# Patient Record
Sex: Male | Born: 1970 | Race: Black or African American | Hispanic: No | Marital: Married | Smoking: Never smoker
Health system: Southern US, Community
[De-identification: ages and names within clinical notes are randomized; demographics above are authoritative.]

## PROBLEM LIST (undated history)

## (undated) DIAGNOSIS — K219 Gastro-esophageal reflux disease without esophagitis: Secondary | ICD-10-CM

## (undated) DIAGNOSIS — E669 Obesity, unspecified: Secondary | ICD-10-CM

## (undated) DIAGNOSIS — E119 Type 2 diabetes mellitus without complications: Secondary | ICD-10-CM

## (undated) HISTORY — DX: Type 2 diabetes mellitus without complications: E11.9

## (undated) HISTORY — DX: Gastro-esophageal reflux disease without esophagitis: K21.9

## (undated) HISTORY — DX: Obesity, unspecified: E66.9

## (undated) HISTORY — PX: GROWTH PLATE SURGERY: SHX657

## (undated) HISTORY — PX: WISDOM TOOTH EXTRACTION: SHX21

---

## 2012-05-29 ENCOUNTER — Ambulatory Visit (INDEPENDENT_AMBULATORY_CARE_PROVIDER_SITE_OTHER): Payer: 59 | Admitting: Internal Medicine

## 2012-05-29 ENCOUNTER — Encounter: Payer: Self-pay | Admitting: Internal Medicine

## 2012-05-29 VITALS — BP 142/80 | HR 78 | Temp 98.7°F | Resp 16 | Ht 72.0 in | Wt 368.0 lb

## 2012-05-29 DIAGNOSIS — Z0000 Encounter for general adult medical examination without abnormal findings: Secondary | ICD-10-CM

## 2012-05-29 DIAGNOSIS — Z23 Encounter for immunization: Secondary | ICD-10-CM | POA: Insufficient documentation

## 2012-05-29 DIAGNOSIS — Z136 Encounter for screening for cardiovascular disorders: Secondary | ICD-10-CM

## 2012-05-29 MED ORDER — LORCASERIN HCL 10 MG PO TABS: 1.0000 | ORAL_TABLET | Freq: Two times a day (BID) | ORAL | Status: DC

## 2012-05-29 NOTE — Progress Notes (Signed)
  Subjective:    Patient ID: Tyler Benton, male    DOB: 1970-11-30, 42 y.o.   MRN: [id number]  HPI  New to me for a physical - he is concerned about his weight and his blood sugar.  Review of Systems  Constitutional: Negative.  Negative for fever, chills, diaphoresis, activity change, appetite change, fatigue and unexpected weight change.  HENT: Negative.   Eyes: Negative.   Respiratory: Negative.  Negative for apnea, cough, choking, chest tightness, shortness of breath, wheezing and stridor.   Cardiovascular: Negative.  Negative for chest pain, palpitations and leg swelling.  Gastrointestinal: Negative.  Negative for abdominal pain.  Endocrine: Negative for polydipsia, polyphagia and polyuria.  Genitourinary: Negative.  Negative for scrotal swelling and testicular pain.  Musculoskeletal: Negative.   Skin: Negative.   Allergic/Immunologic: Negative.   Neurological: Negative.  Negative for dizziness, tremors, syncope, weakness and light-headedness.  Hematological: Negative.  Negative for adenopathy. Does not bruise/bleed easily.  Psychiatric/Behavioral: Negative.        Objective:   Physical Exam  Vitals reviewed. Constitutional: He is oriented to person, place, and time. He appears well-developed and well-nourished. No distress.  HENT:  Head: Normocephalic and atraumatic.  Mouth/Throat: No oropharyngeal exudate.  Eyes: Conjunctivae are normal. Right eye exhibits no discharge. Left eye exhibits no discharge. No scleral icterus.  Neck: Normal range of motion. Neck supple. No JVD present. No tracheal deviation present. No thyromegaly present.  Cardiovascular: Normal rate, regular rhythm, normal heart sounds and intact distal pulses.  Exam reveals no gallop and no friction rub.   No murmur heard. Pulmonary/Chest: Effort normal and breath sounds normal. No stridor. No respiratory distress. He has no wheezes. He has no rales. He exhibits no tenderness.  Abdominal: Soft. Bowel sounds  are normal. He exhibits no distension and no mass. There is no tenderness. There is no rebound and no guarding. Hernia confirmed negative in the right inguinal area and confirmed negative in the left inguinal area.  Genitourinary: Testes normal and penis normal. Right testis shows no mass, no swelling and no tenderness. Right testis is descended. Left testis shows no mass, no swelling and no tenderness. Left testis is descended. Circumcised. No penile erythema or penile tenderness. No discharge found.  Musculoskeletal: Normal range of motion. He exhibits no edema and no tenderness.  Lymphadenopathy:    He has no cervical adenopathy.       Right: No inguinal adenopathy present.       Left: No inguinal adenopathy present.  Neurological: He is oriented to person, place, and time.  Skin: Skin is warm and dry. No rash noted. He is not diaphoretic. No erythema. No pallor.  Psychiatric: He has a normal mood and affect. His behavior is normal. Judgment and thought content normal.      No results found for this basename: WBC, HGB, HCT, PLT, GLUCOSE, CHOL, TRIG, HDL, LDLDIRECT, LDLCALC, ALT, AST, NA, K, CL, CREATININE, BUN, CO2, TSH, PSA, INR, GLUF, HGBA1C, MICROALBUR      Assessment & Plan:

## 2012-05-29 NOTE — Assessment & Plan Note (Signed)
Exam done Vaccines were reviewed Labs ordered Pt ed material was given 

## 2012-05-29 NOTE — Assessment & Plan Note (Signed)
I will check his labs today to look for complications and secondary causes He will try belviq as well

## 2012-05-29 NOTE — Patient Instructions (Signed)
Health Maintenance, Males A healthy lifestyle and preventative care can promote health and wellness.  Maintain regular health, dental, and eye exams.  Eat a healthy diet. Foods like vegetables, fruits, whole grains, low-fat dairy products, and lean protein foods contain the nutrients you need without too many calories. Decrease your intake of foods high in solid fats, added sugars, and salt. Get information about a proper diet from your caregiver, if necessary.  Regular physical exercise is one of the most important things you can do for your health. Most adults should get at least 150 minutes of moderate-intensity exercise (any activity that increases your heart rate and causes you to sweat) each week. In addition, most adults need muscle-strengthening exercises on 2 or more days a week.   Maintain a healthy weight. The body mass index (BMI) is a screening tool to identify possible weight problems. It provides an estimate of body fat based on height and weight. Your caregiver can help determine your BMI, and can help you achieve or maintain a healthy weight. For adults 20 years and older:  A BMI below 18.5 is considered underweight.  A BMI of 18.5 to 24.9 is normal.  A BMI of 25 to 29.9 is considered overweight.  A BMI of 30 and above is considered obese.  Maintain normal blood lipids and cholesterol by exercising and minimizing your intake of saturated fat. Eat a balanced diet with plenty of fruits and vegetables. Blood tests for lipids and cholesterol should begin at age 20 and be repeated every 5 years. If your lipid or cholesterol levels are high, you are over 50, or you are a high risk for heart disease, you may need your cholesterol levels checked more frequently.Ongoing high lipid and cholesterol levels should be treated with medicines, if diet and exercise are not effective.  If you smoke, find out from your caregiver how to quit. If you do not use tobacco, do not start.  If you  choose to drink alcohol, do not exceed 2 drinks per day. One drink is considered to be 12 ounces (355 mL) of beer, 5 ounces (148 mL) of wine, or 1.5 ounces (44 mL) of liquor.  Avoid use of street drugs. Do not share needles with anyone. Ask for help if you need support or instructions about stopping the use of drugs.  High blood pressure causes heart disease and increases the risk of stroke. Blood pressure should be checked at least every 1 to 2 years. Ongoing high blood pressure should be treated with medicines if weight loss and exercise are not effective.  If you are 45 to 40 years old, ask your caregiver if you should take aspirin to prevent heart disease.  Diabetes screening involves taking a blood sample to check your fasting blood sugar level. This should be done once every 3 years, after age 45, if you are within normal weight and without risk factors for diabetes. Testing should be considered at a younger age or be carried out more frequently if you are overweight and have at least 1 risk factor for diabetes.  Colorectal cancer can be detected and often prevented. Most routine colorectal cancer screening begins at the age of 50 and continues through age 75. However, your caregiver may recommend screening at an earlier age if you have risk factors for colon cancer. On a yearly basis, your caregiver may provide home test kits to check for hidden blood in the stool. Use of a small camera at the end of a tube,   to directly examine the colon (sigmoidoscopy or colonoscopy), can detect the earliest forms of colorectal cancer. Talk to your caregiver about this at age 50, when routine screening begins. Direct examination of the colon should be repeated every 5 to 10 years through age 75, unless early forms of pre-cancerous polyps or small growths are found.  Hepatitis C blood testing is recommended for all people born from 1945 through 1965 and any individual with known risks for hepatitis C.  Healthy  men should no longer receive prostate-specific antigen (PSA) blood tests as part of routine cancer screening. Consult with your caregiver about prostate cancer screening.  Testicular cancer screening is not recommended for adolescents or adult males who have no symptoms. Screening includes self-exam, caregiver exam, and other screening tests. Consult with your caregiver about any symptoms you have or any concerns you have about testicular cancer.  Practice safe sex. Use condoms and avoid high-risk sexual practices to reduce the spread of sexually transmitted infections (STIs).  Use sunscreen with a sun protection factor (SPF) of 30 or greater. Apply sunscreen liberally and repeatedly throughout the day. You should seek shade when your shadow is shorter than you. Protect yourself by wearing long sleeves, pants, a wide-brimmed hat, and sunglasses year round, whenever you are outdoors.  Notify your caregiver of new moles or changes in moles, especially if there is a change in shape or color. Also notify your caregiver if a mole is larger than the size of a pencil eraser.  A one-time screening for abdominal aortic aneurysm (AAA) and surgical repair of large AAAs by sound wave imaging (ultrasonography) is recommended for ages 65 to 75 years who are current or former smokers.  Stay current with your immunizations. Document Released: 07/20/2007 Document Revised: 04/15/2011 Document Reviewed: 06/18/2010 [employer] Patient Information 2013 [employer].  

## 2012-05-29 NOTE — Assessment & Plan Note (Signed)
His EKG is normal and he has no symptoms of heart disease

## 2012-06-01 ENCOUNTER — Encounter: Payer: Self-pay | Admitting: Internal Medicine

## 2012-06-01 ENCOUNTER — Other Ambulatory Visit (INDEPENDENT_AMBULATORY_CARE_PROVIDER_SITE_OTHER): Payer: 59

## 2012-06-01 DIAGNOSIS — Z0000 Encounter for general adult medical examination without abnormal findings: Secondary | ICD-10-CM

## 2012-06-01 LAB — COMPREHENSIVE METABOLIC PANEL
ALT: 33 U/L (ref 0–53)
AST: 26 U/L (ref 0–37)
Albumin: 4 g/dL (ref 3.5–5.2)
Alkaline Phosphatase: 51 U/L (ref 39–117)
BUN: 13 mg/dL (ref 6–23)
CO2: 31 mEq/L (ref 19–32)
Calcium: 9.3 mg/dL (ref 8.4–10.5)
Chloride: 103 mEq/L (ref 96–112)
Creatinine, Ser: 0.9 mg/dL (ref 0.4–1.5)
GFR: 114.33 mL/min (ref >60.00)
Glucose, Bld: 158 mg/dL — ABNORMAL HIGH (ref 70–99)
Potassium: 4.3 mEq/L (ref 3.5–5.1)
Sodium: 140 mEq/L (ref 135–145)
Total Bilirubin: 1.3 mg/dL — ABNORMAL HIGH (ref 0.3–1.2)
Total Protein: 7.6 g/dL (ref 6.0–8.3)

## 2012-06-01 LAB — CBC WITH DIFFERENTIAL/PLATELET
Basophils Absolute: 0.1 10*3/uL (ref 0.0–0.1)
Basophils Relative: 0.7 % (ref 0.0–3.0)
Eosinophils Absolute: 0.1 10*3/uL (ref 0.0–0.7)
Eosinophils Relative: 1.9 % (ref 0.0–5.0)
HCT: 44.6 % (ref 39.0–52.0)
Hemoglobin: 15 g/dL (ref 13.0–17.0)
Lymphocytes Relative: 25.7 % (ref 12.0–46.0)
Lymphs Abs: 1.9 10*3/uL (ref 0.7–4.0)
MCHC: 33.6 g/dL (ref 30.0–36.0)
MCV: 96.8 fl (ref 78.0–100.0)
Monocytes Absolute: 0.8 10*3/uL (ref 0.1–1.0)
Monocytes Relative: 11.1 % (ref 3.0–12.0)
Neutro Abs: 4.5 10*3/uL (ref 1.4–7.7)
Neutrophils Relative %: 60.6 % (ref 43.0–77.0)
Platelets: 289 10*3/uL (ref 150.0–400.0)
RBC: 4.61 Mil/uL (ref 4.22–5.81)
RDW: 13.2 % (ref 11.5–14.6)
WBC: 7.4 10*3/uL (ref 4.5–10.5)

## 2012-06-01 LAB — URINALYSIS, ROUTINE W REFLEX MICROSCOPIC
Bilirubin Urine: NEGATIVE
Hgb urine dipstick: NEGATIVE
Ketones, ur: NEGATIVE
Leukocytes, UA: NEGATIVE
Nitrite: NEGATIVE
Specific Gravity, Urine: >=1.03 (ref 1.000–1.030)
Total Protein, Urine: NEGATIVE
Urine Glucose: NEGATIVE
Urobilinogen, UA: 0.2 (ref 0.0–1.0)
pH: 6 (ref 5.0–8.0)

## 2012-06-01 LAB — TSH: TSH: 1.04 u[IU]/mL (ref 0.35–5.50)

## 2012-06-01 LAB — HEMOGLOBIN A1C: Hgb A1c MFr Bld: 6.6 % — ABNORMAL HIGH (ref 4.6–6.5)

## 2012-06-01 LAB — LIPID PANEL
Cholesterol: 178 mg/dL (ref 0–200)
HDL: 40.1 mg/dL (ref >39.00)
LDL Cholesterol: 124 mg/dL — ABNORMAL HIGH (ref 0–99)
Total CHOL/HDL Ratio: 4
Triglycerides: 70 mg/dL (ref 0.0–149.0)
VLDL: 14 mg/dL (ref 0.0–40.0)

## 2013-09-24 ENCOUNTER — Other Ambulatory Visit (INDEPENDENT_AMBULATORY_CARE_PROVIDER_SITE_OTHER): Payer: 59

## 2013-09-24 ENCOUNTER — Ambulatory Visit (INDEPENDENT_AMBULATORY_CARE_PROVIDER_SITE_OTHER): Payer: 59 | Admitting: Internal Medicine

## 2013-09-24 ENCOUNTER — Encounter: Payer: Self-pay | Admitting: Internal Medicine

## 2013-09-24 VITALS — BP 120/88 | HR 88 | Temp 98.5°F | Resp 16 | Ht 72.0 in | Wt 351.0 lb

## 2013-09-24 DIAGNOSIS — IMO0001 Reserved for inherently not codable concepts without codable children: Secondary | ICD-10-CM

## 2013-09-24 DIAGNOSIS — E1165 Type 2 diabetes mellitus with hyperglycemia: Secondary | ICD-10-CM

## 2013-09-24 DIAGNOSIS — R0683 Snoring: Secondary | ICD-10-CM | POA: Insufficient documentation

## 2013-09-24 DIAGNOSIS — M722 Plantar fascial fibromatosis: Secondary | ICD-10-CM

## 2013-09-24 DIAGNOSIS — Z0000 Encounter for general adult medical examination without abnormal findings: Secondary | ICD-10-CM

## 2013-09-24 DIAGNOSIS — G4733 Obstructive sleep apnea (adult) (pediatric): Secondary | ICD-10-CM

## 2013-09-24 DIAGNOSIS — E118 Type 2 diabetes mellitus with unspecified complications: Secondary | ICD-10-CM | POA: Insufficient documentation

## 2013-09-24 LAB — COMPREHENSIVE METABOLIC PANEL
ALT: 44 U/L (ref 0–53)
AST: 32 U/L (ref 0–37)
Albumin: 4.2 g/dL (ref 3.5–5.2)
Alkaline Phosphatase: 61 U/L (ref 39–117)
BUN: 11 mg/dL (ref 6–23)
CO2: 32 mEq/L (ref 19–32)
Calcium: 10 mg/dL (ref 8.4–10.5)
Chloride: 95 mEq/L — ABNORMAL LOW (ref 96–112)
Creatinine, Ser: 1 mg/dL (ref 0.4–1.5)
GFR: 112.2 mL/min (ref >60.00)
Glucose, Bld: 266 mg/dL — ABNORMAL HIGH (ref 70–99)
Potassium: 3.9 mEq/L (ref 3.5–5.1)
Sodium: 135 mEq/L (ref 135–145)
Total Bilirubin: 1.6 mg/dL — ABNORMAL HIGH (ref 0.2–1.2)
Total Protein: 7.9 g/dL (ref 6.0–8.3)

## 2013-09-24 LAB — CBC WITH DIFFERENTIAL/PLATELET
Basophils Absolute: 0.1 10*3/uL (ref 0.0–0.1)
Basophils Relative: 1 % (ref 0.0–3.0)
Eosinophils Absolute: 0.1 10*3/uL (ref 0.0–0.7)
Eosinophils Relative: 1.2 % (ref 0.0–5.0)
HCT: 48.6 % (ref 39.0–52.0)
Hemoglobin: 16.3 g/dL (ref 13.0–17.0)
Lymphocytes Relative: 25.2 % (ref 12.0–46.0)
Lymphs Abs: 1.7 10*3/uL (ref 0.7–4.0)
MCHC: 33.5 g/dL (ref 30.0–36.0)
MCV: 96.7 fl (ref 78.0–100.0)
Monocytes Absolute: 0.7 10*3/uL (ref 0.1–1.0)
Monocytes Relative: 10.4 % (ref 3.0–12.0)
Neutro Abs: 4.2 10*3/uL (ref 1.4–7.7)
Neutrophils Relative %: 62.2 % (ref 43.0–77.0)
Platelets: 285 10*3/uL (ref 150.0–400.0)
RBC: 5.03 Mil/uL (ref 4.22–5.81)
RDW: 13.1 % (ref 11.5–15.5)
WBC: 6.7 10*3/uL (ref 4.0–10.5)

## 2013-09-24 LAB — TSH: TSH: 1.2 u[IU]/mL (ref 0.35–4.50)

## 2013-09-24 LAB — LIPID PANEL
Cholesterol: 210 mg/dL — ABNORMAL HIGH (ref 0–200)
HDL: 41.7 mg/dL (ref >39.00)
LDL Cholesterol: 148 mg/dL — ABNORMAL HIGH (ref 0–99)
NonHDL: 168.3
Total CHOL/HDL Ratio: 5
Triglycerides: 102 mg/dL (ref 0.0–149.0)
VLDL: 20.4 mg/dL (ref 0.0–40.0)

## 2013-09-24 LAB — HEMOGLOBIN A1C: Hgb A1c MFr Bld: 9.2 % — ABNORMAL HIGH (ref 4.6–6.5)

## 2013-09-24 MED ORDER — LIRAGLUTIDE 18 MG/3ML ~~LOC~~ SOPN: 1.0000 | PEN_INJECTOR | Freq: Every day | SUBCUTANEOUS | Status: DC

## 2013-09-24 MED ORDER — DAPAGLIFLOZIN PRO-METFORMIN ER 5-1000 MG PO TB24: 2.0000 | ORAL_TABLET | Freq: Every day | ORAL | Status: DC

## 2013-09-24 MED ORDER — LORCASERIN HCL 10 MG PO TABS: 1.0000 | ORAL_TABLET | Freq: Two times a day (BID) | ORAL | Status: DC

## 2013-09-24 NOTE — Assessment & Plan Note (Signed)
Will refer to sleep med for evaluation and treatment

## 2013-09-24 NOTE — Patient Instructions (Signed)
Health Maintenance A healthy lifestyle and preventative care can promote health and wellness.  Maintain regular health, dental, and eye exams.  Eat a healthy diet. Foods like vegetables, fruits, whole grains, low-fat dairy products, and lean protein foods contain the nutrients you need and are low in calories. Decrease your intake of foods high in solid fats, added sugars, and salt. Get information about a proper diet from your health care provider, if necessary.  Regular physical exercise is one of the most important things you can do for your health. Most adults should get at least 150 minutes of moderate-intensity exercise (any activity that increases your heart rate and causes you to sweat) each week. In addition, most adults need muscle-strengthening exercises on 2 or more days a week.   Maintain a healthy weight. The body mass index (BMI) is a screening tool to identify possible weight problems. It provides an estimate of body fat based on height and weight. Your health care provider can find your BMI and can help you achieve or maintain a healthy weight. For males 20 years and older:  A BMI below 18.5 is considered underweight.  A BMI of 18.5 to 24.9 is normal.  A BMI of 25 to 29.9 is considered overweight.  A BMI of 30 and above is considered obese.  Maintain normal blood lipids and cholesterol by exercising and minimizing your intake of saturated fat. Eat a balanced diet with plenty of fruits and vegetables. Blood tests for lipids and cholesterol should begin at age 20 and be repeated every 5 years. If your lipid or cholesterol levels are high, you are over age 50, or you are at high risk for heart disease, you may need your cholesterol levels checked more frequently.Ongoing high lipid and cholesterol levels should be treated with medicines if diet and exercise are not working.  If you smoke, find out from your health care provider how to quit. If you do not use tobacco, do not  start.  Lung cancer screening is recommended for adults aged 55-80 years who are at high risk for developing lung cancer because of a history of smoking. A yearly low-dose CT scan of the lungs is recommended for people who have at least a 30-pack-year history of smoking and are current smokers or have quit within the past 15 years. A pack year of smoking is smoking an average of 1 pack of cigarettes a day for 1 year (for example, a 30-pack-year history of smoking could mean smoking 1 pack a day for 30 years or 2 packs a day for 15 years). Yearly screening should continue until the smoker has stopped smoking for at least 15 years. Yearly screening should be stopped for people who develop a health problem that would prevent them from having lung cancer treatment.  If you choose to drink alcohol, do not have more than 2 drinks per day. One drink is considered to be 12 oz (360 mL) of beer, 5 oz (150 mL) of wine, or 1.5 oz (45 mL) of liquor.  Avoid the use of street drugs. Do not share needles with anyone. Ask for help if you need support or instructions about stopping the use of drugs.  High blood pressure causes heart disease and increases the risk of stroke. Blood pressure should be checked at least every 1-2 years. Ongoing high blood pressure should be treated with medicines if weight loss and exercise are not effective.  If you are 45-79 years old, ask your health care provider if   you should take aspirin to prevent heart disease.  Diabetes screening involves taking a blood sample to check your fasting blood sugar level. This should be done once every 3 years after age 45 if you are at a normal weight and without risk factors for diabetes. Testing should be considered at a younger age or be carried out more frequently if you are overweight and have at least 1 risk factor for diabetes.  Colorectal cancer can be detected and often prevented. Most routine colorectal cancer screening begins at the age of 50  and continues through age 75. However, your health care provider may recommend screening at an earlier age if you have risk factors for colon cancer. On a yearly basis, your health care provider may provide home test kits to check for hidden blood in the stool. A small camera at the end of a tube may be used to directly examine the colon (sigmoidoscopy or colonoscopy) to detect the earliest forms of colorectal cancer. Talk to your health care provider about this at age 50 when routine screening begins. A direct exam of the colon should be repeated every 5-10 years through age 75, unless early forms of precancerous polyps or small growths are found.  People who are at an increased risk for hepatitis B should be screened for this virus. You are considered at high risk for hepatitis B if:  You were born in a country where hepatitis B occurs often. Talk with your health care provider about which countries are considered high risk.  Your parents were born in a high-risk country and you have not received a shot to protect against hepatitis B (hepatitis B vaccine).  You have HIV or AIDS.  You use needles to inject street drugs.  You live with, or have sex with, someone who has hepatitis B.  You are a man who has sex with other men (MSM).  You get hemodialysis treatment.  You take certain medicines for conditions like cancer, organ transplantation, and autoimmune conditions.  Hepatitis C blood testing is recommended for all people born from 1945 through 1965 and any individual with known risk factors for hepatitis C.  Healthy men should no longer receive prostate-specific antigen (PSA) blood tests as part of routine cancer screening. Talk to your health care provider about prostate cancer screening.  Testicular cancer screening is not recommended for adolescents or adult males who have no symptoms. Screening includes self-exam, a health care provider exam, and other screening tests. Consult with your  health care provider about any symptoms you have or any concerns you have about testicular cancer.  Practice safe sex. Use condoms and avoid high-risk sexual practices to reduce the spread of sexually transmitted infections (STIs).  You should be screened for STIs, including gonorrhea and chlamydia if:  You are sexually active and are younger than 24 years.  You are older than 24 years, and your health care provider tells you that you are at risk for this type of infection.  Your sexual activity has changed since you were last screened, and you are at an increased risk for chlamydia or gonorrhea. Ask your health care provider if you are at risk.  If you are at risk of being infected with HIV, it is recommended that you take a prescription medicine daily to prevent HIV infection. This is called pre-exposure prophylaxis (PrEP). You are considered at risk if:  You are a man who has sex with other men (MSM).  You are a heterosexual man who   is sexually active with multiple partners.  You take drugs by injection.  You are sexually active with a partner who has HIV.  Talk with your health care provider about whether you are at high risk of being infected with HIV. If you choose to begin PrEP, you should first be tested for HIV. You should then be tested every 3 months for as long as you are taking PrEP.  Use sunscreen. Apply sunscreen liberally and repeatedly throughout the day. You should seek shade when your shadow is shorter than you. Protect yourself by wearing long sleeves, pants, a wide-brimmed hat, and sunglasses year round whenever you are outdoors.  Tell your health care provider of new moles or changes in moles, especially if there is a change in shape or color. Also, tell your health care provider if a mole is larger than the size of a pencil eraser.  A one-time screening for abdominal aortic aneurysm (AAA) and surgical repair of large AAAs by ultrasound is recommended for men aged  65-75 years who are current or former smokers.  Stay current with your vaccines (immunizations). Document Released: 07/20/2007 Document Revised: 01/26/2013 Document Reviewed: 06/18/2010 Exit[employer] Patient Information 2015 [employer]. This information is not intended to replace advice given to you by your health care provider. Make sure you discuss any questions you have with your health care provider. Type 2 Diabetes Mellitus Type 2 diabetes mellitus, often simply referred to as type 2 diabetes, is a long-lasting (chronic) disease. In type 2 diabetes, the pancreas does not make enough insulin (a hormone), the cells are less responsive to the insulin that is made (insulin resistance), or both. Normally, insulin moves sugars from food into the tissue cells. The tissue cells use the sugars for energy. The lack of insulin or the lack of normal response to insulin causes excess sugars to build up in the blood instead of going into the tissue cells. As a result, high blood sugar (hyperglycemia) develops. The effect of high sugar (glucose) levels can cause many complications. Type 2 diabetes was also previously called adult-onset diabetes, but it can occur at any age.  RISK FACTORS  A person is predisposed to developing type 2 diabetes if someone in the family has the disease and also has one or more of the following primary risk factors:  Overweight.  An inactive lifestyle.  A history of consistently eating high-calorie foods. Maintaining a normal weight and regular physical activity can reduce the chance of developing type 2 diabetes. SYMPTOMS  A person with type 2 diabetes may not show symptoms initially. The symptoms of type 2 diabetes appear slowly. The symptoms include:  Increased thirst (polydipsia).  Increased urination (polyuria).  Increased urination during the night (nocturia).  Weight loss. This weight loss may be rapid.  Frequent, recurring infections.  Tiredness  (fatigue).  Weakness.  Vision changes, such as blurred vision.  Fruity smell to your breath.  Abdominal pain.  Nausea or vomiting.  Cuts or bruises which are slow to heal.  Tingling or numbness in the hands or feet. DIAGNOSIS Type 2 diabetes is frequently not diagnosed until complications of diabetes are present. Type 2 diabetes is diagnosed when symptoms or complications are present and when blood glucose levels are increased. Your blood glucose level may be checked by one or more of the following blood tests:  A fasting blood glucose test. You will not be allowed to eat for at least 8 hours before a blood sample is taken.  A random blood   glucose test. Your blood glucose is checked at any time of the day regardless of when you ate.  A hemoglobin A1c blood glucose test. A hemoglobin A1c test provides information about blood glucose control over the previous 3 months.  An oral glucose tolerance test (OGTT). Your blood glucose is measured after you have not eaten (fasted) for 2 hours and then after you drink a glucose-containing beverage. TREATMENT   You may need to take insulin or diabetes medicine daily to keep blood glucose levels in the desired range.  If you use insulin, you may need to adjust the dosage depending on the carbohydrates that you eat with each meal or snack. The treatment goal is to maintain the before meal blood sugar (preprandial glucose) level at 70-130 mg/dL. HOME CARE INSTRUCTIONS   Have your hemoglobin A1c level checked twice a year.  Perform daily blood glucose monitoring as directed by your health care provider.  Monitor urine ketones when you are ill and as directed by your health care provider.  Take your diabetes medicine or insulin as directed by your health care provider to maintain your blood glucose levels in the desired range.  Never run out of diabetes medicine or insulin. It is needed every day.  If you are using insulin, you may need to  adjust the amount of insulin given based on your intake of carbohydrates. Carbohydrates can raise blood glucose levels but need to be included in your diet. Carbohydrates provide vitamins, minerals, and fiber which are an essential part of a healthy diet. Carbohydrates are found in fruits, vegetables, whole grains, dairy products, legumes, and foods containing added sugars.  Eat healthy foods. You should make an appointment to see a registered dietitian to help you create an eating plan that is right for you.  Lose weight if you are overweight.  Carry a medical alert card or wear your medical alert jewelry.  Carry a 15-gram carbohydrate snack with you at all times to treat low blood glucose (hypoglycemia). Some examples of 15-gram carbohydrate snacks include:  Glucose tablets, 3 or 4.  Glucose gel, 15-gram tube.  Raisins, 2 tablespoons (24 grams).  Jelly beans, 6.  Animal crackers, 8.  Regular pop, 4 ounces (120 mL).  Gummy treats, 9.  Recognize hypoglycemia. Hypoglycemia occurs with blood glucose levels of 70 mg/dL and below. The risk for hypoglycemia increases when fasting or skipping meals, during or after intense exercise, and during sleep. Hypoglycemia symptoms can include:  Tremors or shakes.  Decreased ability to concentrate.  Sweating.  Increased heart rate.  Headache.  Dry mouth.  Hunger.  Irritability.  Anxiety.  Restless sleep.  Altered speech or coordination.  Confusion.  Treat hypoglycemia promptly. If you are alert and able to safely swallow, follow the 15:15 rule:  Take 15-20 grams of rapid-acting glucose or carbohydrate. Rapid-acting options include glucose gel, glucose tablets, or 4 ounces (120 mL) of fruit juice, regular soda, or low-fat milk.  Check your blood glucose level 15 minutes after taking the glucose.  Take 15-20 grams more of glucose if the repeat blood glucose level is still 70 mg/dL or below.  Eat a meal or snack within 1 hour  once blood glucose levels return to normal.  Be alert to feeling very thirsty and urinating more frequently than usual, which are early signs of hyperglycemia. An early awareness of hyperglycemia allows for prompt treatment. Treat hyperglycemia as directed by your health care provider.  Engage in at least 150 minutes of moderate-intensity physical activity   a week, spread over at least 3 days of the week or as directed by your health care provider. In addition, you should engage in resistance exercise at least 2 times a week or as directed by your health care provider. Try to spend no more than 90 minutes at one time inactive.  Adjust your medicine and food intake as needed if you start a new exercise or sport.  Follow your sick-day plan anytime you are unable to eat or drink as usual.  Do not use any tobacco products including cigarettes, chewing tobacco, or electronic cigarettes. If you need help quitting, ask your health care provider.  Limit alcohol intake to no more than 1 drink per day for nonpregnant women and 2 drinks per day for men. You should drink alcohol only when you are also eating food. Talk with your health care provider whether alcohol is safe for you. Tell your health care provider if you drink alcohol several times a week.  Keep all follow-up visits as directed by your health care provider. This is important.  Schedule an eye exam soon after the diagnosis of type 2 diabetes and then annually.  Perform daily skin and foot care. Examine your skin and feet daily for cuts, bruises, redness, nail problems, bleeding, blisters, or sores. A foot exam by a health care provider should be done annually.  Brush your teeth and gums at least twice a day and floss at least once a day. Follow up with your dentist regularly.  Share your diabetes management plan with your workplace or school.  Stay up-to-date with immunizations. It is recommended that people with diabetes who are over 65  years old get the pneumonia vaccine. In some cases, two separate shots may be given. Ask your health care provider if your pneumonia vaccination is up-to-date.  Learn to manage stress.  Obtain ongoing diabetes education and support as needed.  Participate in or seek rehabilitation as needed to maintain or improve independence and quality of life. Request a physical or occupational therapy referral if you are having foot or hand numbness, or difficulties with grooming, dressing, eating, or physical activity. SEEK MEDICAL CARE IF:   You are unable to eat food or drink fluids for more than 6 hours.  You have nausea and vomiting for more than 6 hours.  Your blood glucose level is over 240 mg/dL.  There is a change in mental status.  You develop an additional serious illness.  You have diarrhea for more than 6 hours.  You have been sick or have had a fever for a couple of days and are not getting better.  You have pain during any physical activity.  SEEK IMMEDIATE MEDICAL CARE IF:  You have difficulty breathing.  You have moderate to large ketone levels. MAKE SURE YOU:  Understand these instructions.  Will watch your condition.  Will get help right away if you are not doing well or get worse. Document Released: 01/21/2005 Document Revised: 06/07/2013 Document Reviewed: 08/20/2011 ExitCare Patient Information 2015 [employer]. This information is not intended to replace advice given to you by your health care provider. Make sure you discuss any questions you have with your health care provider.  

## 2013-09-24 NOTE — Assessment & Plan Note (Signed)
He will restart Belviq I have asked him to see nutrition as well

## 2013-09-24 NOTE — Assessment & Plan Note (Signed)
Will start victoza and xigduo to lower his blood sugars He is due for an eye exam He will see diabetic education

## 2013-09-24 NOTE — Assessment & Plan Note (Signed)
Exam done Vaccines were reviewed and updated Labs ordered Pt ed material was given 

## 2013-09-24 NOTE — Progress Notes (Signed)
Subjective:    Patient ID: Tyler Benton, male    DOB: Sep 26, 1970, 43 y.o.   MRN: [id number]  Diabetes He presents for his follow-up diabetic visit. He has type 2 diabetes mellitus. The initial diagnosis of diabetes was made 2 years ago. His disease course has been worsening. There are no hypoglycemic associated symptoms. Associated symptoms include blurred vision, polydipsia, polyphagia and polyuria. Pertinent negatives for diabetes include no chest pain, no fatigue, no foot paresthesias, no foot ulcerations, no visual change, no weakness and no weight loss. There are no hypoglycemic complications. Symptoms are worsening. There are no diabetic complications. Risk factors for coronary artery disease include obesity. Current diabetic treatment includes intensive insulin program and insulin injections. His weight is decreasing steadily. He is following a generally healthy diet. Meal planning includes avoidance of concentrated sweets. He has not had a previous visit with a dietician. He never participates in exercise. His home blood glucose trend is increasing steadily. His breakfast blood glucose range is generally >200 mg/dl. His lunch blood glucose range is generally >200 mg/dl. His dinner blood glucose range is generally >200 mg/dl. His highest blood glucose is >200 mg/dl. His overall blood glucose range is >200 mg/dl. An ACE inhibitor/angiotensin II receptor blocker is not being taken. He does not see a podiatrist.Eye exam is not current.      Review of Systems  Constitutional: Negative.  Negative for fever, chills, weight loss, diaphoresis, activity change, appetite change, fatigue and unexpected weight change.  HENT: Negative.   Eyes: Positive for blurred vision.  Respiratory: Positive for apnea. Negative for cough, choking, chest tightness, shortness of breath, wheezing and stridor.        His daughter tells him that he snores heavily and that he stops breathing during the night.    Cardiovascular: Negative.  Negative for chest pain and leg swelling.  Gastrointestinal: Negative.  Negative for nausea, vomiting, abdominal pain, diarrhea, constipation and blood in stool.  Endocrine: Positive for polydipsia, polyphagia and polyuria.  Genitourinary: Negative.   Musculoskeletal: Positive for arthralgias (bilateral foot pain). Negative for back pain, gait problem, joint swelling, myalgias, neck pain and neck stiffness.  Skin: Negative.  Negative for rash.  Allergic/Immunologic: Negative.   Neurological: Negative.  Negative for weakness.  Hematological: Negative.  Negative for adenopathy. Does not bruise/bleed easily.  Psychiatric/Behavioral: Negative.        Objective:   Physical Exam  Vitals reviewed. Constitutional: He is oriented to person, place, and time. He appears well-developed and well-nourished. No distress.  HENT:  Head: Normocephalic and atraumatic.  Mouth/Throat: Oropharynx is clear and moist. No oropharyngeal exudate.  Eyes: Conjunctivae are normal. Right eye exhibits no discharge. Left eye exhibits no discharge. No scleral icterus.  Neck: Normal range of motion. Neck supple. No JVD present. No tracheal deviation present. No thyromegaly present.  Cardiovascular: Normal rate, normal heart sounds and intact distal pulses.  Exam reveals no gallop and no friction rub.   No murmur heard. Pulmonary/Chest: Effort normal and breath sounds normal. No stridor. No respiratory distress. He has no wheezes. He has no rales. He exhibits no tenderness.  Abdominal: Soft. Bowel sounds are normal. He exhibits no distension and no mass. There is no tenderness. There is no rebound and no guarding. Hernia confirmed negative in the right inguinal area and confirmed negative in the left inguinal area.  Genitourinary: Testes normal and penis normal. Right testis shows no mass, no swelling and no tenderness. Right testis is descended. Left testis shows no mass,  no swelling and no  tenderness. Left testis is descended. Circumcised. No penile erythema or penile tenderness. No discharge found.  Musculoskeletal: Normal range of motion. He exhibits no edema and no tenderness.       Right foot: He exhibits deformity (severely flatfooted). He exhibits normal range of motion, no tenderness, no bony tenderness, no swelling, normal capillary refill, no crepitus and no laceration.       Left foot: He exhibits deformity (severely flatfooted). He exhibits normal range of motion, no tenderness, no bony tenderness, no swelling, normal capillary refill, no crepitus and no laceration.  Lymphadenopathy:    He has no cervical adenopathy.       Right: No inguinal adenopathy present.       Left: No inguinal adenopathy present.  Neurological: He is oriented to person, place, and time.  Skin: Skin is warm and dry. No rash noted. He is not diaphoretic. No erythema. No pallor.  Psychiatric: He has a normal mood and affect. His behavior is normal. Judgment and thought content normal.     Lab Results  Component Value Date   WBC 7.4 06/01/2012   HGB 15.0 06/01/2012   HCT 44.6 06/01/2012   PLT 289.0 06/01/2012   GLUCOSE 158* 06/01/2012   CHOL 178 06/01/2012   TRIG 70.0 06/01/2012   HDL 40.10 06/01/2012   LDLCALC 124* 06/01/2012   ALT 33 06/01/2012   AST 26 06/01/2012   NA 140 06/01/2012   K 4.3 06/01/2012   CL 103 06/01/2012   CREATININE 0.9 06/01/2012   BUN 13 06/01/2012   CO2 31 06/01/2012   TSH 1.04 06/01/2012   HGBA1C 6.6* 06/01/2012       Assessment & Plan:

## 2013-09-24 NOTE — Assessment & Plan Note (Signed)
I think he needs orthotics - sports med referral

## 2013-09-24 NOTE — Progress Notes (Signed)
Pre visit review using our clinic review tool, if applicable. No additional management support is needed unless otherwise documented below in the visit note. 

## 2013-09-25 ENCOUNTER — Encounter: Payer: Self-pay | Admitting: Internal Medicine

## 2013-09-27 ENCOUNTER — Telehealth: Payer: Self-pay | Admitting: Internal Medicine

## 2013-09-27 DIAGNOSIS — E1165 Type 2 diabetes mellitus with hyperglycemia: Principal | ICD-10-CM

## 2013-09-27 DIAGNOSIS — IMO0001 Reserved for inherently not codable concepts without codable children: Secondary | ICD-10-CM

## 2013-09-27 MED ORDER — DAPAGLIFLOZIN PRO-METFORMIN ER 5-1000 MG PO TB24: 2.0000 | ORAL_TABLET | Freq: Every day | ORAL | Status: DC

## 2013-09-27 MED ORDER — LORCASERIN HCL 10 MG PO TABS: 1.0000 | ORAL_TABLET | Freq: Two times a day (BID) | ORAL | Status: DC

## 2013-09-27 MED ORDER — LIRAGLUTIDE 18 MG/3ML ~~LOC~~ SOPN: 1.0000 | PEN_INJECTOR | Freq: Every day | SUBCUTANEOUS | Status: DC

## 2013-09-27 NOTE — Telephone Encounter (Signed)
Done

## 2013-09-27 NOTE — Telephone Encounter (Signed)
Pt called request for our office to send 3 meds that was just sent into [facility] in [place] need to be call into [facility] pt pharmacy. Please advise.

## 2013-09-28 ENCOUNTER — Telehealth: Payer: Self-pay

## 2013-09-28 ENCOUNTER — Other Ambulatory Visit: Payer: Self-pay | Admitting: Internal Medicine

## 2013-09-28 DIAGNOSIS — E1165 Type 2 diabetes mellitus with hyperglycemia: Principal | ICD-10-CM

## 2013-09-28 DIAGNOSIS — IMO0001 Reserved for inherently not codable concepts without codable children: Secondary | ICD-10-CM

## 2013-09-28 MED ORDER — LIRAGLUTIDE 18 MG/3ML ~~LOC~~ SOPN: 1.0000 | PEN_INJECTOR | Freq: Every day | SUBCUTANEOUS | Status: DC

## 2013-09-28 MED ORDER — LIRAGLUTIDE 18 MG/3ML ~~LOC~~ SOPN: PEN_INJECTOR | SUBCUTANEOUS | Status: DC

## 2013-09-28 MED ORDER — LORCASERIN HCL 10 MG PO TABS: 1.0000 | ORAL_TABLET | Freq: Two times a day (BID) | ORAL | Status: DC

## 2013-09-28 MED ORDER — DAPAGLIFLOZIN PRO-METFORMIN ER 5-1000 MG PO TB24: 2.0000 | ORAL_TABLET | Freq: Every day | ORAL | Status: DC

## 2013-09-28 NOTE — Telephone Encounter (Signed)
Patient notified and RX resent to [facility].

## 2013-09-28 NOTE — Telephone Encounter (Signed)
rx re sent to pharm

## 2013-10-01 ENCOUNTER — Ambulatory Visit (INDEPENDENT_AMBULATORY_CARE_PROVIDER_SITE_OTHER): Payer: 59 | Admitting: Family Medicine

## 2013-10-01 ENCOUNTER — Encounter: Payer: Self-pay | Admitting: Family Medicine

## 2013-10-01 ENCOUNTER — Other Ambulatory Visit (INDEPENDENT_AMBULATORY_CARE_PROVIDER_SITE_OTHER): Payer: 59

## 2013-10-01 VITALS — BP 140/86 | HR 82 | Ht 72.0 in | Wt 341.0 lb

## 2013-10-01 DIAGNOSIS — M79609 Pain in unspecified limb: Secondary | ICD-10-CM

## 2013-10-01 DIAGNOSIS — M79671 Pain in right foot: Secondary | ICD-10-CM

## 2013-10-01 DIAGNOSIS — M722 Plantar fascial fibromatosis: Secondary | ICD-10-CM

## 2013-10-01 NOTE — Progress Notes (Signed)
  [facility] [address] [place], [place] [address] Phone: [phone] Subjective:    I'm seeing this patient by the request  of:  Sanda Linger, MD   CC: Right foot pain  BJY:NWGNFAOZHY Tyler Benton is a 43 y.o. male coming in with complaint of right foot pain. Patient has had this pain for quite some time. Patient states it seems to be intermittent. Patient states recently it is starting to give him more discomfort after sitting for long amount of time. Patient states it seems to do better with movement. Patient has tried changing shoes with minimal benefit. Patient states that the severity of it seems to be worsening. Denies any nighttime pain, any radiation of pain or any numbness. Describes it as a sharp tearing sensation. Patient was the severity of 7/10. Not stop him from any activity at this time.     Past medical history, social, surgical and family history all reviewed in electronic medical record.   Review of Systems: No headache, visual changes, nausea, vomiting, diarrhea, constipation, dizziness, abdominal pain, skin rash, fevers, chills, night sweats, weight loss, swollen lymph nodes, body aches, joint swelling, muscle aches, chest pain, shortness of breath, mood changes.   Objective Blood pressure 140/86, pulse 82, height 6' (1.829 m), weight 341 lb (154.677 kg).  General: No apparent distress alert and oriented x3 mood and affect normal, dressed appropriately. Obese.  HEENT: Pupils equal, extraocular movements intact  Respiratory: Patient's speak in full sentences and does not appear short of breath  Cardiovascular: No lower extremity edema, non tender, no erythema  Skin: Warm dry intact with no signs of infection or rash on extremities or on axial skeleton.  Abdomen: Soft nontender  Neuro: Cranial nerves II through XII are intact, neurovascularly intact in all extremities with 2+ DTRs and 2+ pulses.  Lymph: No lymphadenopathy of posterior or  anterior cervical chain or axillae bilaterally.  Gait normal with good balance and coordination.  MSK:  Non tender with full range of motion and good stability and symmetric strength and tone of shoulders, elbows, wrist, hip, knee and ankles bilaterally.  Foot exam shows the patient does have severe pes planus bilaterally with over pronation of the hindfoot. Patient also has breakdown of the transverse arch bilaterally with bunion formation on the right foot.   MSK US performed of: Right foot pain This study was ordered, performed, and interpreted by Terrilee Files D.O.  Foot/Ankle:   All structures visualized.   Talar dome unremarkable  Ankle mortise without effusion. Peroneus longus and brevis tendons unremarkable on long and transverse views without sheath effusions. Posterior tibialis, flexor hallucis longus, and flexor digitorum longus tendons unremarkable on long and transverse views without sheath effusions. Achilles tendon visualized along length of tendon and unremarkable on long and transverse views without sheath effusion. Anterior Talofibular Ligament and Calcaneofibular Ligaments unremarkable and intact. Deltoid Ligament unremarkable and intact. Plantar fascia does have some mild increase in thickening with 1.1 cm in diameter. Minimal hypoechoic changes noted.  IMPRESSION:  Plantar fasciitis      Impression and Recommendations:     This case required medical decision making of moderate complexity.

## 2013-10-01 NOTE — Patient Instructions (Signed)
Good to meet you Ice bath 20 minutes at the end of the day.  On step drop heels as far as you can. Up on toes hold 2 seconds and then down slow for count a 4.  30 reps daily first week then 2 sets of 30 daily 2nd week and then 3 sets daily thereafter.  Look at handout and do those 3 times a week Good shoes, Tyler Benton, [employer] and New balance greater then 700.  Come back in 3-4 weeks and we will see how you are doing.

## 2013-10-01 NOTE — Assessment & Plan Note (Signed)
Patient's finding is suggestive of plantar fasciitis. Differential also includes contusion of the calcaneus but no significant hypoechoic changes to warrant this is the diagnosis. Patient given recommendation of different shoes, home exercises, icing and continuing patient's anti-inflammatories. Patient will try these interventions and come back and see me again in 3-4 weeks. Continuing to have difficulty we may need to consider custom orthotics. This we can discuss further at followup. Patient may also be a candidate for physical therapy.

## 2013-10-14 ENCOUNTER — Ambulatory Visit (INDEPENDENT_AMBULATORY_CARE_PROVIDER_SITE_OTHER): Payer: 59 | Admitting: Endocrinology

## 2013-10-14 ENCOUNTER — Encounter: Payer: Self-pay | Admitting: Endocrinology

## 2013-10-14 VITALS — BP 161/109 | HR 80 | Temp 97.9°F | Resp 16 | Ht 72.0 in | Wt 346.0 lb

## 2013-10-14 DIAGNOSIS — E7800 Pure hypercholesterolemia, unspecified: Secondary | ICD-10-CM

## 2013-10-14 DIAGNOSIS — IMO0001 Reserved for inherently not codable concepts without codable children: Secondary | ICD-10-CM

## 2013-10-14 DIAGNOSIS — E042 Nontoxic multinodular goiter: Secondary | ICD-10-CM

## 2013-10-14 DIAGNOSIS — E1165 Type 2 diabetes mellitus with hyperglycemia: Principal | ICD-10-CM

## 2013-10-14 LAB — GLUCOSE, POCT (MANUAL RESULT ENTRY): POC Glucose: 183 mg/dl — AB (ref 70–99)

## 2013-10-14 NOTE — Progress Notes (Signed)
Patient ID: Tyler Benton, male   DOB: 1970-12-20, 43 y.o.   MRN: [id number]            Reason for Appointment: Consultation for Type 2 Diabetes  Referring physician: Sanda Linger  History of Present Illness:          Diagnosis: Type 2 diabetes mellitus, date of diagnosis: 2014       Past history: He had routine labs in 2014 which showed A1c of 6.6 and glucose of 158 fasting This was felt to be borderline and he was not started on any medication He was given Belviq for weight loss but he did not continue this after 2 weeks since he did not help and he found it expensive  Recent history:  On his recent followup evaluation in 8/15 his blood sugar was found to be significantly higher with fasting glucose of 266 and A1c of 9.2 However he does not think he had any symptoms of increased thirst, frequent urination, blurred vision or fatigue He has been trying to lose weight over the last few months with exercising on and off and improving his diet; apparently had lost 30 pounds He has not seen a dietitian as yet and this is scheduled for October He has however started trying to exercise more regularly with walking or aerobic exercise at the gym He is checking his blood sugar only at work and does not keep a record of this       Oral hypoglycemic drugs the patient is taking are: Xigduo      Side effects from medications have been:  none Compliance with the medical regimen: fair  Glucose monitoring:  done irregularly        Glucometer:  not at home    Blood Glucose readings from recall: Recent range 108-146  Self-care: The diet that the patient has been following is: none, has some sweets, trying to reduce these.    Meals: 3 meals per day. Breakfast is oatmeal, lunch is [place] or salad or sandwich. Dinner usually sandwich, snacks on popcorn and peanuts         Exercise: At Gym 3-4 /7 days a week, up to one hour         Dietician visit, most recent: None.               Weight history: was  380 in 2014  Wt Readings from Last 3 Encounters:  10/14/13 346 lb (156.945 kg)  10/01/13 341 lb (154.677 kg)  09/24/13 351 lb (159.213 kg)    Glycemic control:   Lab Results  Component Value Date   HGBA1C 9.2* 09/24/2013   HGBA1C 6.6* 06/01/2012   Lab Results  Component Value Date   LDLCALC 148* 09/24/2013   CREATININE 1.0 09/24/2013        Medication List       This list is accurate as of: 10/14/13  9:43 AM.  Always use your most recent med list.               Dapagliflozin-Metformin HCl ER 06-998 MG Tb24  Commonly known as:  XIGDUO XR  Take 2 tablets by mouth daily.     Liraglutide 18 MG/3ML Sopn  Commonly known as:  VICTOZA  Inject 0.6 mg into skin first week, inject 1.2 mg into skin second week, and 1.8 mg into skin afterwards        Allergies: No Known Allergies  Past Medical History  Diagnosis Date  . GERD (gastroesophageal reflux  disease)     No past surgical history on file.  Family History  Problem Relation Age of Onset  . Heart disease Mother   . Hypertension Mother   . Early death Neg Hx   . Alcohol abuse Neg Hx   . Birth defects Neg Hx   . Cancer Neg Hx   . COPD Neg Hx   . Drug abuse Neg Hx   . Hyperlipidemia Neg Hx   . Kidney disease Neg Hx   . Stroke Neg Hx   . Diabetes Maternal Grandfather     Social History:  reports that he has never smoked. He has never used smokeless tobacco. He reports that he does not drink alcohol or use illicit drugs.    Review of Systems       Vision is normal. Most recent eye exam was 2014       Lipids: He has never been on statin drugs      Lab Results  Component Value Date   CHOL 210* 09/24/2013   HDL 41.70 09/24/2013   LDLCALC 148* 09/24/2013   TRIG 102.0 09/24/2013   CHOLHDL 5 09/24/2013                  Skin: No rash or infections     Thyroid:  No  unusual fatigue.     The blood pressure has been normal when he is checking, usually has white coat syndrome. Home readings: 120-130/70-86      No swelling of feet.     No shortness of breath or chest tightness  on exertion.     No daytime somnolence but has snoring and occasional apnea, awaiting sleep study     Bowel habits: Normal.      No joint  pains.          No history of Numbness, tingling or burning in feet     LABS:  Office Visit on 10/14/2013  Component Date Value Ref Range Status  . POC Glucose 10/14/2013 183* 70 - 99 mg/dl Final    Physical Examination:  BP 161/109  Pulse 80  Temp(Src) 97.9 F (36.6 C)  Resp 16  Ht 6' (1.829 m)  Wt 346 lb (156.945 kg)  BMI 46.92 kg/m2  SpO2 95%  GENERAL:         Patient has marked generalized obesity, no cushingoid features.   HEENT:         Eye exam shows normal external appearance. Fundus exam shows no retinopathy. Oral exam shows normal mucosa .  NECK:         General:  Neck exam shows no lymphadenopathy. Carotids are normal to palpation and no bruit heard.  Thyroid is palpable: Right side just palpable and firm, slightly nodular. Left side shows a larger lateral nodule about 2.5 cm and a smaller medial 1-1.5 cm nodule, mostly felt on swallowing LUNGS:         Chest is symmetrical. Lungs are clear to auscultation.Marland Kitchen   HEART:         Heart sounds:  S1 and S2 are normal. No murmurs or clicks heard., no S3 or S4.   ABDOMEN:   There is no distention present. Liver and spleen are not palpable. No other mass or tenderness present.  EXTREMITIES:     There is no edema.   NEUROLOGICAL:   Vibration sense is mildly  reduced in toes. Ankle jerks are absent bilaterally.          Diabetic  foot exam: Diabetic foot exam shows normal monofilament sensation in the toes and plantar surfaces, no skin lesions or ulcers on the feet and normal pedal pulses  MUSCULOSKELETAL:       There is no enlargement or deformity of the joints. Spine is normal to inspection.Marland Kitchen   SKIN:       No rash or lesions. Mild acanthosis of the neck present, mostly pigmentation       ASSESSMENT:  Diabetes type 2,  uncontrolled    He has had significant escalation of his hyperglycemia in the last few months and may have had weight loss as a consequence Surprisingly he has been asymptomatic even with blood sugars over 200 He has recently been started on triple therapy with Victoza, metformin and Marcelline Deist which he has tolerated Currently on 1.2 mg Victoza with no side effects although he is somewhat reluctant to take daily injections He has not checked his blood sugars much at home but only at work and he claims that these are fairly good at different times Today his glucose is relatively high after eating a granola bar and may be higher after carbohydrate intake  Complications: None evident  HYPERCHOLESTEROLEMIA: Currently untreated. Discussed benefits and indications for using statins in patients with diabetes This will be discussed again on her followup visit unless he is going to followup with PCP for this  THYROID nodule/multinodular goiter: This has not been diagnosed before and needs evaluation with thyroid ultrasound Recent TSH is normal. Discussed possibility of needle aspiration if he has a dominant nodule, likely he has a larger nodule in the left side  PLAN:   Continue same medication regimen for now  Start glucose monitoring at home with the [facility] monitor. Discussed timing of glucose testing and blood sugar targets  Consider increasing Victoza to 1.8 mg if weight loss does not continue or if having continued hyperglycemia  Given him patient education booklet on Victoza and basic information on type 2 diabetes  Discussed and explained A1c testing and periodic monitoring with this  He will have another A1c on his next visit  He will try to moderate on portions of carbohydrates and have protein consistently with meals including breakfast  Thyroid ultrasound  Followup lipids in about 3 months  Counseling time over 50% of today's 60 minute visit  Carlo Guevarra 10/14/2013, 9:43 AM    Note: This office note was prepared with Insurance underwriter. Any transcriptional errors that result from this process are unintentional.

## 2013-10-14 NOTE — Patient Instructions (Signed)
Please check blood sugars at least half the time about 2 hours after any meal and 2 times per week on waking up. Please bring blood sugar monitor to each visit  

## 2013-10-20 ENCOUNTER — Telehealth: Payer: Self-pay | Admitting: Internal Medicine

## 2013-10-20 ENCOUNTER — Other Ambulatory Visit: Payer: Self-pay | Admitting: *Deleted

## 2013-10-20 MED ORDER — GLUCOSE BLOOD VI STRP: ORAL_STRIP | Status: DC

## 2013-10-20 NOTE — Telephone Encounter (Signed)
Pt needs test strips for his new meter called into [facility] one touch verio IQ

## 2013-10-22 ENCOUNTER — Ambulatory Visit (INDEPENDENT_AMBULATORY_CARE_PROVIDER_SITE_OTHER): Payer: 59 | Admitting: Family Medicine

## 2013-10-22 ENCOUNTER — Encounter: Payer: Self-pay | Admitting: Pulmonary Disease

## 2013-10-22 ENCOUNTER — Ambulatory Visit (INDEPENDENT_AMBULATORY_CARE_PROVIDER_SITE_OTHER): Payer: 59 | Admitting: Pulmonary Disease

## 2013-10-22 ENCOUNTER — Encounter: Payer: Self-pay | Admitting: Family Medicine

## 2013-10-22 VITALS — BP 138/84 | HR 84 | Ht 72.0 in | Wt 346.0 lb

## 2013-10-22 VITALS — BP 140/80 | HR 80 | Temp 98.5°F | Ht 72.0 in | Wt 344.6 lb

## 2013-10-22 DIAGNOSIS — R0609 Other forms of dyspnea: Secondary | ICD-10-CM

## 2013-10-22 DIAGNOSIS — R0989 Other specified symptoms and signs involving the circulatory and respiratory systems: Secondary | ICD-10-CM

## 2013-10-22 DIAGNOSIS — M722 Plantar fascial fibromatosis: Secondary | ICD-10-CM

## 2013-10-22 DIAGNOSIS — R0683 Snoring: Secondary | ICD-10-CM

## 2013-10-22 NOTE — Progress Notes (Deleted)
   Subjective:    Patient ID: Jerami Tammen, male    DOB: 1970/08/19, 43 y.o.   MRN: [id number]  HPI    Review of Systems  Constitutional: Negative for fever and unexpected weight change.  HENT: Positive for congestion. Negative for dental problem, ear pain, nosebleeds, postnasal drip, rhinorrhea, sinus pressure, sneezing, sore throat and trouble swallowing.   Eyes: Negative for redness and itching.  Respiratory: Negative for cough, chest tightness, shortness of breath and wheezing.   Cardiovascular: Negative for palpitations and leg swelling.  Gastrointestinal: Negative for nausea and vomiting.       Acid heartburn  Genitourinary: Negative for dysuria.  Musculoskeletal: Negative for joint swelling.  Skin: Negative for rash.  Neurological: Negative for headaches.  Hematological: Does not bruise/bleed easily.  Psychiatric/Behavioral: Negative for dysphoric mood. The patient is not nervous/anxious.        Objective:   Physical Exam        Assessment & Plan:

## 2013-10-22 NOTE — Progress Notes (Signed)
Chief Complaint  Patient presents with  . SLEEP CONSULT    Referred by Dr Yetta Barre. Epworth Score: 3    History of Present Illness: Tyler Benton is a 43 y.o. male for evaluation of sleep problems.  He was working with EMS, and had very busy schedule.  He was not able to keep up with his exercise or diet, and his weight went up to almost 430 lbs.  During this time his daughter was concerned about his snoring, and he would stop breathing at times.  He would also have trouble sleeping on his back.  He has since changed jobs, and this allows more time for exercise.  He has modified his diet as well.  With this he has lost almost 100 lbs.  His sleep and breathing pattern have improved with weight loss.  He goes to sleep at 10 pm.  He falls asleep after 20 minutes.  He wakes up one time to use the bathroom.  He gets out of bed at 7 am.  He feels okay in the morning.  He denies morning headache.  He does not use anything to help him fall sleep or stay awake.  He can fall asleep while reading, but otherwise does not have trouble staying awake.  He denies sleep walking, sleep talking, bruxism, or nightmares.  There is no history of restless legs.  He denies sleep hallucinations, sleep paralysis, or cataplexy.  The Epworth score is 3 out of 24.  Tyler Benton  has a past medical history of GERD (gastroesophageal reflux disease).  Tyler Benton  has past surgical history that includes Growth plate surgery (Bilateral, 1980s) and Wisdom tooth extraction.  Prior to Admission medications   Medication Sig Start Date End Date Taking? Authorizing Provider  Dapagliflozin-Metformin HCl ER (XIGDUO XR) 06-998 MG TB24 Take 2 tablets by mouth daily. 09/28/13  Yes Etta Grandchild, MD  glucose blood (ONETOUCH VERIO) test strip Use to test blood sugar 1 time a day as instructed. 10/20/13  Yes Reather Littler, MD  Liraglutide 18 MG/3ML SOPN Inject 1.8 mg as directed daily. 09/28/13  Yes Etta Grandchild, MD    No Known  Allergies  His family history includes Diabetes in his maternal grandfather; Heart disease in his mother; Hypertension in his mother. There is no history of Early death, Alcohol abuse, Birth defects, Cancer, COPD, Drug abuse, Hyperlipidemia, Kidney disease, or Stroke.  He  reports that he has never smoked. He has never used smokeless tobacco. He reports that he does not drink alcohol or use illicit drugs.  Review of Systems  Constitutional: Negative for fever and unexpected weight change.  HENT: Positive for congestion. Negative for dental problem, ear pain, nosebleeds, postnasal drip, rhinorrhea, sinus pressure, sneezing, sore throat and trouble swallowing.   Eyes: Negative for redness and itching.  Respiratory: Negative for cough, chest tightness, shortness of breath and wheezing.   Cardiovascular: Negative for palpitations and leg swelling.  Gastrointestinal: Negative for nausea and vomiting.       Acid heartburn  Genitourinary: Negative for dysuria.  Musculoskeletal: Negative for joint swelling.  Skin: Negative for rash.  Neurological: Negative for headaches.  Hematological: Does not bruise/bleed easily.  Psychiatric/Behavioral: Negative for dysphoric mood. The patient is not nervous/anxious.    Physical Exam:  General - No distress ENT - No sinus tenderness, no oral exudate, no LAN, no thyromegaly, TM clear, pupils equal/reactive, MP 3, 2+ tonsils Cardiac - s1s2 regular, no murmur, pulses symmetric Chest - No wheeze/rales/dullness, good air  entry, normal respiratory excursion Back - No focal tenderness Abd - Soft, non-tender, no organomegaly, + bowel sounds Ext - No edema Neuro - Normal strength, cranial nerves intact Skin - No rashes Psych - Normal mood, and behavior  Assessment/plan:  Coralyn Helling, M.D. Pager [phone]

## 2013-10-22 NOTE — Patient Instructions (Signed)
Follow up in 6 months 

## 2013-10-22 NOTE — Progress Notes (Signed)
  [facility] [address] [place], [place] [address] Phone: [phone] Subjective:    CC: Right foot pain follow up  BJY:NWGNFAOZHY Anant Agard is a 43 y.o. male coming in with complaint of right foot pain. Patient was found to have plantar fasciitis of the both feet the right greater than left. Patient wanted to try conservative therapy with different shoes, icing, home exercises, and patient declined physical therapy. Patient states he is approximately 90% better. Patient has been doing the home exercises very regularly. Patient did not get any new shoes though. Patient has been doing icing as well. Patient states that he is less pain throughout the day and states that he is able to do all activities of daily living. Having no nighttime awakening anymore. Patient is happy with the results.     Past medical history, social, surgical and family history all reviewed in electronic medical record.   Review of Systems: No headache, visual changes, nausea, vomiting, diarrhea, constipation, dizziness, abdominal pain, skin rash, fevers, chills, night sweats, weight loss, swollen lymph nodes, body aches, joint swelling, muscle aches, chest pain, shortness of breath, mood changes.   Objective Blood pressure 138/84, pulse 84, height 6' (1.829 m), weight 346 lb (156.945 kg), SpO2 97.00%.  General: No apparent distress alert and oriented x3 mood and affect normal, dressed appropriately. Obese.  HEENT: Pupils equal, extraocular movements intact  Respiratory: Patient's speak in full sentences and does not appear short of breath  Cardiovascular: No lower extremity edema, non tender, no erythema  Skin: Warm dry intact with no signs of infection or rash on extremities or on axial skeleton.  Abdomen: Soft nontender  Neuro: Cranial nerves II through XII are intact, neurovascularly intact in all extremities with 2+ DTRs and 2+ pulses.  Lymph: No lymphadenopathy of posterior or  anterior cervical chain or axillae bilaterally.  Gait normal with good balance and coordination.  MSK:  Non tender with full range of motion and good stability and symmetric strength and tone of shoulders, elbows, wrist, hip, knee and ankles bilaterally.  Foot exam shows the patient does have severe pes planus bilaterally with over pronation of the hindfoot. Patient also has breakdown of the transverse arch bilaterally with bunion formation on the right foot. Nontender to exam   MSK US performed of: Right foot pain This study was ordered, performed, and interpreted by Terrilee Files D.O.  Foot/Ankle:   All structures visualized.   Talar dome unremarkable  Ankle mortise without effusion. Peroneus longus and brevis tendons unremarkable on long and transverse views without sheath effusions. Posterior tibialis, flexor hallucis longus, and flexor digitorum longus tendons unremarkable on long and transverse views without sheath effusions. Achilles tendon visualized along length of tendon and unremarkable on long and transverse views without sheath effusion. Anterior Talofibular Ligament and Calcaneofibular Ligaments unremarkable and intact. Deltoid Ligament unremarkable and intact. Plantar fascia does have some mild increase in thickening with 0.68 cm in diameter. Results hypoechoic changes  IMPRESSION:  Improved Plantar fasciitis    Impression and Recommendations:     This case required medical decision making of moderate complexity.

## 2013-10-22 NOTE — Patient Instructions (Signed)
Good to see you You are doing great Continue the exercises regularly at least 3 times a week.  Ice is your friend.  [employer] orthotics online look for total support Tyler Benton, and New balance greater then 700.  Come back in 6 weeks to check in.

## 2013-10-22 NOTE — Assessment & Plan Note (Signed)
Patient is doing remarkably well overall. Patient does have severe pes planus that is going to contribute and may need custom orthotics a long run but at this time is doing well. We encouraged him to get wider shoes and was given suggestions. Patient will continue the exercises and the icing for the next 6 weeks and then followup with me at that time.

## 2013-10-22 NOTE — Assessment & Plan Note (Signed)
He has snoring, and his family reports periods of apnea.  These were much more prominent when he was heavier, but he has lost almost 100 lbs.  He plans to continue weight loss efforts.  With weight loss his snoring and breathing while asleep have improved.  I gave him the option to have further assessment for sleep apnea now, or he can continue weight loss efforts and then re-assess progress in 6 months.  He has opted to defer sleep testing for now, and continue weight loss efforts.

## 2013-11-02 ENCOUNTER — Ambulatory Visit
Admission: RE | Admit: 2013-11-02 | Discharge: 2013-11-02 | Disposition: A | Discharge status: Home / Self Care | Payer: 59 | Source: Ambulatory Visit | Attending: Endocrinology | Admitting: Endocrinology

## 2013-11-02 ENCOUNTER — Encounter (INDEPENDENT_AMBULATORY_CARE_PROVIDER_SITE_OTHER): Payer: Self-pay

## 2013-11-02 DIAGNOSIS — E042 Nontoxic multinodular goiter: Secondary | ICD-10-CM

## 2013-11-05 ENCOUNTER — Other Ambulatory Visit: Payer: Self-pay | Admitting: Endocrinology

## 2013-11-05 DIAGNOSIS — E042 Nontoxic multinodular goiter: Secondary | ICD-10-CM

## 2013-11-12 ENCOUNTER — Encounter: Payer: Self-pay | Admitting: Dietician

## 2013-11-12 ENCOUNTER — Encounter: Payer: 59 | Attending: Internal Medicine | Admitting: Dietician

## 2013-11-12 VITALS — Ht 72.0 in | Wt 345.5 lb

## 2013-11-12 DIAGNOSIS — Z713 Dietary counseling and surveillance: Secondary | ICD-10-CM | POA: Diagnosis not present

## 2013-11-12 DIAGNOSIS — E119 Type 2 diabetes mellitus without complications: Secondary | ICD-10-CM | POA: Diagnosis not present

## 2013-11-12 NOTE — Progress Notes (Signed)
Medical Nutrition Therapy:  Appt start time: 0800 end time:  0930.   Assessment:  Primary concerns today: Tyler Benton is referred today for diabetes management.  He was diagnosed 2 years ago, most recent HgA1c was 9.2% on 09/24/13. No prior formal diabetes education. He has lost about 40 lbs over the last year.  He works for [employer] 9-5:30pm 5 days a week and works as a Education officer, environmentalpastor on the weekend.  He lives with his wife and his son and daughter.  Him and his wife do the food shopping and cooking.  He eats out on the weekends 1-2 times to places like pizza, [employer], or [employer]. He checks his BG twice a day, in the morning when he gets to work around [employer]am and in the evening around 8:30pm, he checks 1-2 hours after meals.  He reports a range of 88-140 in the last week.  He reports he is motivated to manage his diabetes.  Learning Readiness:  Ready  MEDICATIONS: see list. Victoza and Metformin.   DIETARY INTAKE:  Usual eating pattern includes 3 meals and 0-1 snacks per day.  24-hr recall:  B (8 AM): egg and cheese sandwich sometimes with ketchup, water     Snk ( AM): none  L (12 PM): flank steak, baked chicken leg, green beans, 1/3 c mashed potatoes, roll no butter, water, small portion of lemon pound cake  Snk ( PM): none D ( PM): 2 grilled peanut butter and jelly sandwiches and glass of 1% milk Snk ( PM): none Beverages: 2-3 cups of coffee with 5-6 single serve cups creamer and splenda, sometimes 2% milk, water  Usual physical activity: tries to walk 1 hour everyday, normally gets 3-4  Progress Towards Goal(s):  In progress.   Nutritional Diagnosis:  NB-1.1 Food and nutrition-related knowledge deficit As related to no prior formal diabetes education.  As evidenced by patient report and HgA1c of 9.2%.    Intervention:  Nutrition education for managing blood glucose with diet and lifestyle changes. Described diabetes. Stated some common risk factors for diabetes.  Defined the role of  glucose and insulin.  Identified type of diabetes and pathophysiology.  Described the relationship between diabetes and cardiovascular risk.  Described the role of different macronutrients on glucose.  Explained how carbohydrates affect blood glucose.  Stated what foods contain the most carbohydrates.  Demonstrated carbohydrate counting.  Demonstrated how to read Nutrition Facts food label. Utilized MyPlate to demonstrate healthy, balanced meals.  Discussed importance of exercise and patient states he wants to try some of the classes [employer] offers, such as Zumba.  Provided class schedule. Discussed purpose of checking blood sugar and logging results to identify trends.  Reviewed factors that effect BG readings.  Provided BG log.   Goals:  Follow Diabetes Meal Plan as instructed  Eat 3 meals and snacks as needed, every 3-5 hrs  Limit carbohydrate intake to 3-4 choices (45-60 grams) carbohydrate/meal  Limit carbohydrate intake to 1-2 choices (15-30 grams) carbohydrate/snack  Add lean protein foods to meals/snacks  Monitor glucose levels as instructed by your doctor  Aim for 30-60 mins of physical activity daily  Bring blood glucose log to next visit  Teaching Method Utilized: Visual Auditory Hands on  Handouts given during visit include:  Living Well With Diabetes  Yellow Card Meal Planner  MyPlate Portion Method  Low Carb Snack Suggestions  [place] [employer] Exercise Class Schedule  Blood Glucose Log  Barriers to learning/adherence to lifestyle change: none  Demonstrated  degree of understanding via:  Teach Back   Monitoring/Evaluation:  Dietary intake, exercise, labs, and body weight in 3 month(s).

## 2013-11-25 ENCOUNTER — Ambulatory Visit: Payer: 59 | Admitting: Endocrinology

## 2013-12-13 ENCOUNTER — Ambulatory Visit: Payer: 59 | Admitting: Endocrinology

## 2013-12-16 ENCOUNTER — Telehealth: Payer: Self-pay | Admitting: Internal Medicine

## 2013-12-16 NOTE — Telephone Encounter (Signed)
Why does he think he has a UTI?

## 2013-12-16 NOTE — Telephone Encounter (Signed)
Pt stated that he is returning your call about medication. Please call him back, not see any note in system.

## 2013-12-16 NOTE — Telephone Encounter (Signed)
Patient is requesting Rx for Cipro be sent to [facility]. Patient stated that new med is taking causes him to have UTI symptoms. Please advise?

## 2013-12-17 MED ORDER — CIPROFLOXACIN HCL 500 MG PO TABS: 500.0000 mg | ORAL_TABLET | Freq: Two times a day (BID) | ORAL | Status: DC

## 2013-12-17 NOTE — Telephone Encounter (Signed)
Rx sent Please come in next week for a f/up

## 2013-12-17 NOTE — Telephone Encounter (Signed)
Patient stated that he is having frequent urination, sharp pain in bladder, with fever and chills. Urine is dark color.

## 2013-12-17 NOTE — Telephone Encounter (Signed)
Patient notified

## 2014-01-12 ENCOUNTER — Encounter: Payer: Self-pay | Admitting: Endocrinology

## 2014-01-12 ENCOUNTER — Ambulatory Visit (INDEPENDENT_AMBULATORY_CARE_PROVIDER_SITE_OTHER): Payer: 59 | Admitting: Endocrinology

## 2014-01-12 VITALS — BP 152/86 | HR 77 | Temp 98.0°F | Resp 16 | Ht 72.0 in | Wt 345.4 lb

## 2014-01-12 DIAGNOSIS — E7800 Pure hypercholesterolemia, unspecified: Secondary | ICD-10-CM

## 2014-01-12 DIAGNOSIS — E042 Nontoxic multinodular goiter: Secondary | ICD-10-CM

## 2014-01-12 DIAGNOSIS — E1165 Type 2 diabetes mellitus with hyperglycemia: Secondary | ICD-10-CM

## 2014-01-12 DIAGNOSIS — IMO0002 Reserved for concepts with insufficient information to code with codable children: Secondary | ICD-10-CM

## 2014-01-12 LAB — LDL CHOLESTEROL, DIRECT: Direct LDL: 141.3 mg/dL

## 2014-01-12 LAB — BASIC METABOLIC PANEL
BUN: 10 mg/dL (ref 6–23)
CO2: 29 mEq/L (ref 19–32)
Calcium: 9.2 mg/dL (ref 8.4–10.5)
Chloride: 100 mEq/L (ref 96–112)
Creatinine, Ser: 0.9 mg/dL (ref 0.4–1.5)
GFR: 113.41 mL/min (ref >60.00)
Glucose, Bld: 142 mg/dL — ABNORMAL HIGH (ref 70–99)
Potassium: 3.6 mEq/L (ref 3.5–5.1)
Sodium: 136 mEq/L (ref 135–145)

## 2014-01-12 LAB — HEMOGLOBIN A1C: Hgb A1c MFr Bld: 6.3 % (ref 4.6–6.5)

## 2014-01-12 NOTE — Patient Instructions (Signed)
Please check blood sugars at least half the time about 2 hours after any meal and 2-3 times per week on waking up.  Please bring blood sugar monitor to each visit  

## 2014-01-12 NOTE — Progress Notes (Signed)
Patient ID: Tyler Benton, male   DOB: 1970/10/03, 43 y.o.   MRN: [id number]            Reason for Appointment: Follow-up for Type 2 Diabetes  Referring physician: Sanda Lingerhomas Jones  History of Present Illness:          Diagnosis: Type 2 diabetes mellitus, date of diagnosis: 2014       Past history: He had routine labs in 2014 which showed A1c of 6.6 and glucose of 158 fasting This was felt to be borderline and he was not started on any medication He was given Belviq for weight loss but he did not continue this after 2 weeks since he did not help and he found it expensive  Recent history:   He was referred for evaluation because of poor blood sugar control in 8/15 and his blood sugar was significantly high with fasting glucose of 266 and A1c of 9.2 This was despite his trying to lose weight and he had lost 30 pounds along with exercising regularly His PCP had started treatment of his diabetes with a combination treatment using Victoza and Xigduo Since his blood sugars had started improving with this regimen his treatment program was not changed on his consultation His Victoza was however increased to 1.8 mg However he did not follow-up as scheduled and no recent A1c available He was asked to check his blood sugars more often but he is checking them sporadically again However his blood sugars are mostly near normal except for one reading of 209 after eating pancakes Not clear if his blood sugars at 8 AM are before or after eating       Oral hypoglycemic drugs the patient is taking are: Xigduo 06/998      Side effects from medications have been:  none Compliance with the medical regimen: fair  Glucose monitoring:  done irregularly        Glucometer: One touch  Blood Glucose readings from download  PRE-MEAL Breakfast Lunch Dinner Bedtime Overall  Glucose range:  107-163   150, 157   91, 130   102-209    Mean/median:  137     140  130   Self-care: The diet that the patient has been  following is: none, has been consistent with diet especially after consultation with dietitian    Meals: 3 meals per day. Breakfast is oatmeal, lunch is frequently salad or sandwich. Dinner usually sandwich, snacks on popcorn and peanuts         Exercise: At Gym 3-4 /7 days a week, up to one hour         Dietician visit, most recent: 10/15            Weight history: was 380 in 2014  Wt Readings from Last 3 Encounters:  01/12/14 345 lb 6.4 oz (156.672 kg)  11/12/13 345 lb 8 oz (156.718 kg)  10/22/13 344 lb 9.6 oz (156.31 kg)    Glycemic control:   Lab Results  Component Value Date   HGBA1C 9.2* 09/24/2013   HGBA1C 6.6* 06/01/2012   Lab Results  Component Value Date   LDLCALC 148* 09/24/2013   CREATININE 1.0 09/24/2013        Medication List       This list is accurate as of: 01/12/14  3:23 PM.  Always use your most recent med list.               Dapagliflozin-Metformin HCl ER 06-998 MG Tb24  Commonly  known as:  XIGDUO XR  Take 2 tablets by mouth daily.     glucose blood test strip  Commonly known as:  ONETOUCH VERIO  Use to test blood sugar 1 time a day as instructed.     Liraglutide 18 MG/3ML Sopn  Inject 1.8 mg as directed daily.     ranitidine 150 MG tablet  Commonly known as:  ZANTAC  Take 150 mg by mouth 2 (two) times daily.        Allergies: No Known Allergies  Past Medical History  Diagnosis Date  . GERD (gastroesophageal reflux disease)   . Diabetes mellitus without complication   . Obesity     Past Surgical History  Procedure Laterality Date  . Growth plate surgery Bilateral 1980s  . Wisdom tooth extraction      Family History  Problem Relation Age of Onset  . Heart disease Mother   . Hypertension Mother   . Early death Neg Hx   . Alcohol abuse Neg Hx   . Birth defects Neg Hx   . Cancer Neg Hx   . COPD Neg Hx   . Drug abuse Neg Hx   . Hyperlipidemia Neg Hx   . Kidney disease Neg Hx   . Stroke Neg Hx   . Diabetes Maternal  Grandfather     Social History:  reports that he has never smoked. He has never used smokeless tobacco. He reports that he does not drink alcohol or use illicit drugs.    Review of Systems       Most recent eye exam was 2014, scheduled for 12/28       Lipids: He has never been on statin drugs despite high LDL      Lab Results  Component Value Date   CHOL 210* 09/24/2013   HDL 41.70 09/24/2013   LDLCALC 148* 09/24/2013   TRIG 102.0 09/24/2013   CHOLHDL 5 09/24/2013                  Thyroid:  No  unusual fatigue.  He was found to have left-sided thyroid nodules on exam and this was confirmed on his ultrasound but he has not followed up.  Does have a dominant nodule about 2.5 cm     The blood pressure has been normal when he is checking, usually has white coat syndrome.  Home readings: 120-140/70-86      LABS:  No visits with results within 1 Week(s) from this visit. Latest known visit with results is:  Office Visit on 10/14/2013  Component Date Value Ref Range Status  . POC Glucose 10/14/2013 183* 70 - 99 mg/dl Final    Physical Examination:  BP 152/86 mmHg  Pulse 77  Temp(Src) 98 F (36.7 C)  Resp 16  Ht 6' (1.829 m)  Wt 345 lb 6.4 oz (156.672 kg)  BMI 46.83 kg/m2  SpO2 97%     ASSESSMENT/PLAN:  Diabetes type 2, uncontrolled    He has had fairly good blood sugars recently with continuing his regimen of maximum dose Victoza and Xigduo Although he thinks that his high readings previously were related to poor diet he most likely has had progression of his diabetes Most of his blood sugars are fairly good although periodically high in the mornings Although he is trying to do fairly well with his diet and exercise especially after seeing the dietitian he has not lost any more weight Tolerating his medications well  Discussed timing of glucose monitoring of her  blood sugar targets and need to do blood sugars consistently either before or 2 hours after meals A1c to  be checked  Complications: None evident  HYPERCHOLESTEROLEMIA: Currently untreated. Discussed benefits and indications for using statins in patients with diabetes This will be discussed again after getting his labs today  THYROID nodule/multinodular goiter: He will be scheduled for needle aspiration off his dominant nodule, discussed implications of thyroid nodules and procedure for biopsy and possible outcomes His goiter is euthyroid  [facility] 01/12/2014, 3:23 PM   Note: This office note was prepared with Dragon voice recognition system technology. Any transcriptional errors that result from this process are unintentional.  Addendum:  Office Visit on 01/12/2014  Component Date Value Ref Range Status  . Hgb A1c MFr Bld 01/12/2014 6.3  4.6 - 6.5 % Final   Glycemic Control Guidelines for People with Diabetes:Non Diabetic:  <6%Goal of Therapy: <7%Additional Action Suggested:  >8%   . Sodium 01/12/2014 136  135 - 145 mEq/L Final  . Potassium 01/12/2014 3.6  3.5 - 5.1 mEq/L Final  . Chloride 01/12/2014 100  96 - 112 mEq/L Final  . CO2 01/12/2014 29  19 - 32 mEq/L Final  . Glucose, Bld 01/12/2014 142* 70 - 99 mg/dL Final  . BUN 02/72/5366 10  6 - 23 mg/dL Final  . Creatinine, Ser 01/12/2014 0.9  0.4 - 1.5 mg/dL Final  . Calcium 44/04/4740 9.2  8.4 - 10.5 mg/dL Final  . GFR 59/56/3875 113.41  >60.00 mL/min Final  . Direct LDL 01/12/2014 141.3   Final   Optimal:  <100 mg/dLNear or Above Optimal:  100-129 mg/dLBorderline High:  130-159 mg/dLHigh:  160-189 mg/dLVery High:  >190 mg/dL

## 2014-01-12 NOTE — Progress Notes (Signed)
Quick Note:  Labs show high cholesterol: He needs to be started on Lipitor 10 mg daily for hypercholesterolemia A1c excellent at 6.3 ______

## 2014-01-14 ENCOUNTER — Other Ambulatory Visit: Payer: Self-pay | Admitting: *Deleted

## 2014-01-14 MED ORDER — ATORVASTATIN CALCIUM 10 MG PO TABS: 10.0000 mg | ORAL_TABLET | Freq: Every day | ORAL | Status: DC

## 2014-02-18 ENCOUNTER — Ambulatory Visit: Payer: 59 | Admitting: Dietician

## 2014-03-02 ENCOUNTER — Other Ambulatory Visit: Payer: Self-pay | Admitting: *Deleted

## 2014-03-02 ENCOUNTER — Telehealth: Payer: Self-pay | Admitting: Internal Medicine

## 2014-03-02 DIAGNOSIS — E1165 Type 2 diabetes mellitus with hyperglycemia: Secondary | ICD-10-CM

## 2014-03-02 DIAGNOSIS — IMO0002 Reserved for concepts with insufficient information to code with codable children: Secondary | ICD-10-CM

## 2014-03-02 MED ORDER — DAPAGLIFLOZIN PRO-METFORMIN ER 5-1000 MG PO TB24: 2.0000 | ORAL_TABLET | Freq: Every day | ORAL | Status: DC

## 2014-03-02 MED ORDER — LIRAGLUTIDE 18 MG/3ML ~~LOC~~ SOPN: 1.8000 mg | PEN_INJECTOR | Freq: Every day | SUBCUTANEOUS | Status: DC

## 2014-03-02 NOTE — Telephone Encounter (Signed)
rx sent

## 2014-03-02 NOTE — Telephone Encounter (Signed)
Pt is looking for refill on his   Xigduo  Victoza 18mg   **  He is say that [facility] A PA sent over for these 2 meds

## 2014-04-13 ENCOUNTER — Ambulatory Visit: Payer: 59 | Admitting: Endocrinology

## 2014-04-13 ENCOUNTER — Other Ambulatory Visit: Payer: 59

## 2014-05-06 ENCOUNTER — Encounter: Payer: Self-pay | Admitting: Endocrinology

## 2014-05-06 ENCOUNTER — Other Ambulatory Visit: Payer: Self-pay | Admitting: *Deleted

## 2014-05-06 ENCOUNTER — Ambulatory Visit (INDEPENDENT_AMBULATORY_CARE_PROVIDER_SITE_OTHER): Payer: 59 | Admitting: Endocrinology

## 2014-05-06 VITALS — BP 130/92 | HR 77 | Temp 98.0°F | Resp 16 | Ht 72.0 in | Wt 342.8 lb

## 2014-05-06 DIAGNOSIS — IMO0002 Reserved for concepts with insufficient information to code with codable children: Secondary | ICD-10-CM

## 2014-05-06 DIAGNOSIS — E1165 Type 2 diabetes mellitus with hyperglycemia: Secondary | ICD-10-CM

## 2014-05-06 DIAGNOSIS — E7800 Pure hypercholesterolemia, unspecified: Secondary | ICD-10-CM

## 2014-05-06 DIAGNOSIS — E042 Nontoxic multinodular goiter: Secondary | ICD-10-CM | POA: Diagnosis not present

## 2014-05-06 DIAGNOSIS — R030 Elevated blood-pressure reading, without diagnosis of hypertension: Secondary | ICD-10-CM

## 2014-05-06 NOTE — Progress Notes (Signed)
Patient ID: Tyler Benton, male   DOB: 01/06/1971, 44 y.o.   MRN: [id number]            Reason for Appointment: Follow-up for Type 2 Diabetes  Referring physician: Sanda Linger  History of Present Illness:          Diagnosis: Type 2 diabetes mellitus, date of diagnosis: 2014       Past history: He had routine labs in 2014 which showed A1c of 6.6 and glucose of 158 fasting This was felt to be borderline and he was not started on any medication He was given Belviq for weight loss but he did not continue this after 2 weeks since he did not help and he found it expensive He was referred for evaluation because of poor blood sugar control in 8/15 and his blood sugar was significantly high with fasting glucose of 266 and A1c of 9.2 This was despite his trying to lose weight and he had lost 30 pounds along with exercising regularly His PCP had started treatment of his diabetes with a combination treatment using Victoza and Xigduo  Recent history:  He was last seen in 12/15 when his A1c was down to 6.3% with a regimen of Xigduo and Victoza 1.8 mg He has checked his blood sugar somewhat irregularly and none recently until today.  He tends to have relatively higher postprandial readings when checked previously However he does not take blood sugars were higher when he could not get his medications authorized through his insurance for sometime in 03/2014 No side effects with restarting Victoza 1.8 mg Recently has stopped doing his exercise but she was doing previously and has been irregular overall       Oral hypoglycemic drugs the patient is taking are: Xigduo 06/998      Side effects from medications have been:  none Compliance with the medical regimen: fair  Glucose monitoring:  done irregularly        Glucometer: One touch  Blood Glucose readings from recall: not checked recently, previously Pc 150-169, rarely 200   Self-care: The diet that the patient has been following is: none, has been  consistent with diet especially after consultation with dietitian    Meals: 3 meals per day. Breakfast is oatmeal, lunch is frequently salad or sandwich. Dinner usually sandwich, snacks on popcorn and peanuts         Exercise: At Gym 0-4 /7 days a week, up to one hour         Dietician visit, most recent: 10/15            Weight history: was 380 in 2014  Wt Readings from Last 3 Encounters:  05/06/14 342 lb 12.8 oz (155.493 kg)  01/12/14 345 lb 6.4 oz (156.672 kg)  11/12/13 345 lb 8 oz (156.718 kg)    Glycemic control:   Lab Results  Component Value Date   HGBA1C 6.3 01/12/2014   HGBA1C 9.2* 09/24/2013   HGBA1C 6.6* 06/01/2012   Lab Results  Component Value Date   LDLCALC 148* 09/24/2013   CREATININE 0.9 01/12/2014        Medication List       This list is accurate as of: 05/06/14  4:54 PM.  Always use your most recent med list.               atorvastatin 10 MG tablet  Commonly known as:  LIPITOR  Take 1 tablet (10 mg total) by mouth daily.  Dapagliflozin-Metformin HCl ER 06-998 MG Tb24  Commonly known as:  XIGDUO XR  Take 2 tablets by mouth daily.     glucose blood test strip  Commonly known as:  ONETOUCH VERIO  Use to test blood sugar 1 time a day as instructed.     Liraglutide 18 MG/3ML Sopn  Inject 0.3 mLs (1.8 mg total) as directed daily.     ranitidine 150 MG tablet  Commonly known as:  ZANTAC  Take 150 mg by mouth 2 (two) times daily.        Allergies: No Known Allergies  Past Medical History  Diagnosis Date  . GERD (gastroesophageal reflux disease)   . Diabetes mellitus without complication   . Obesity     Past Surgical History  Procedure Laterality Date  . Growth plate surgery Bilateral 1980s  . Wisdom tooth extraction      Family History  Problem Relation Age of Onset  . Heart disease Mother   . Hypertension Mother   . Early death Neg Hx   . Alcohol abuse Neg Hx   . Birth defects Neg Hx   . Cancer Neg Hx   . COPD Neg Hx     . Drug abuse Neg Hx   . Hyperlipidemia Neg Hx   . Kidney disease Neg Hx   . Stroke Neg Hx   . Thyroid disease Neg Hx   . Diabetes Maternal Grandfather     Social History:  reports that he has never smoked. He has never used smokeless tobacco. He reports that he does not drink alcohol or use illicit drugs.    Review of Systems       Most recent eye exam was  01/31/14       Lipids: He has been started Lipitor 10 mg daily and needs follow-up levels.  Baseline LDL has been in the 140s      Lab Results  Component Value Date   CHOL 210* 09/24/2013   HDL 41.70 09/24/2013   LDLCALC 148* 09/24/2013   LDLDIRECT 141.3 01/12/2014   TRIG 102.0 09/24/2013   CHOLHDL 5 09/24/2013                  Thyroid:  No  unusual fatigue.   He was found to have left-sided thyroid nodules on exam and this was confirmed on his ultrasound   Does have a dominant nodule about 2.5 cm which was supposed to be biopsied after his ultrasound in 9/15 Apparently did not get scheduled for needle biopsy of a left-sided dominant nodule      The blood pressure again appears to be high in the office today Previously has been normal when he checks at home or work and most recent readings have been about 140 systolic/70 diastolic Has not been on any medications so far     LABS:  No visits with results within 1 Week(s) from this visit. Latest known visit with results is:  Office Visit on 01/12/2014  Component Date Value Ref Range Status  . Hgb A1c MFr Bld 01/12/2014 6.3  4.6 - 6.5 % Final   Glycemic Control Guidelines for People with Diabetes:Non Diabetic:  <6%Goal of Therapy: <7%Additional Action Suggested:  >8%   . Sodium 01/12/2014 136  135 - 145 mEq/L Final  . Potassium 01/12/2014 3.6  3.5 - 5.1 mEq/L Final  . Chloride 01/12/2014 100  96 - 112 mEq/L Final  . CO2 01/12/2014 29  19 - 32 mEq/L Final  . Glucose, Bld  01/12/2014 142* 70 - 99 mg/dL Final  . BUN 16/11/9602 10  6 - 23 mg/dL Final  . Creatinine,  Ser 01/12/2014 0.9  0.4 - 1.5 mg/dL Final  . Calcium 54/10/8117 9.2  8.4 - 10.5 mg/dL Final  . GFR 14/78/2956 113.41  >60.00 mL/min Final  . Direct LDL 01/12/2014 141.3   Final   Optimal:  <100 mg/dLNear or Above Optimal:  100-129 mg/dLBorderline High:  130-159 mg/dLHigh:  160-189 mg/dLVery High:  >190 mg/dL    Physical Examination:  BP 130/92 mmHg  Pulse 77  Temp(Src) 98 F (36.7 C)  Resp 16  Ht 6' (1.829 m)  Wt 342 lb 12.8 oz (155.493 kg)  BMI 46.48 kg/m2  SpO2 96%     Left thyroid nodule is about 2.5 cm No lymphadenopathy in the neck No edema  ASSESSMENT/PLAN:  Diabetes type 2, uncontrolled    He is on a regimen of Xigduo and Victoza Reportedly his blood sugars are not consistently normal especially after meals even with his 3 drug regimen Also has difficulty losing weight Recently has not been exercising which he had done before Also blood sugar monitoring has been somewhat sporadic  For now we will continue his regimen unchanged but check his A1c again today Discussed blood sugar targets before and after meals Discussed regular exercise and increase her birth to lose weight with moderation of diet Also should benefit from continuing current regimen long-term to prevent progression and regaining his weight  Preventive care: He does need to have regular eye exams.  Also is due for his microalbumin  HYPERTENSION: Blood pressure is relatively high, generally followed by PCP He thinks his blood pressure has been better diastolic at work than today but needs to check more regularly May benefit from regular exercise and weight loss also  HYPERCHOLESTEROLEMIA: recently has started on Lipitor and will need to have follow-up levels Results will be discussed again after getting his labs today  THYROID nodule/multinodular goiter:  Apparently did not get scheduled for needle biopsy of a left-sided dominant nodule Since it has been 6 months since his previous ultrasound will  repeat this and decide on further management Discussed potential for abnormal biopsy and surgery if indicated   Patient Instructions  Please check blood sugars at least half the time about 2 hours after any meal and 3 times per week on waking up. Please bring blood sugar monitor to each visit. Recommended blood sugar levels about 2 hours after meal is 140-180 and on waking up 90-130  Restart exercise   Counseling time over 50% of today's 25 minute visit  Bartow Zylstra 05/06/2014, 4:54 PM   Note: This office note was prepared with Insurance underwriter. Any transcriptional errors that result from this process are unintentional.  Addendum:

## 2014-05-06 NOTE — Patient Instructions (Signed)
Please check blood sugars at least half the time about 2 hours after any meal and 3 times per week on waking up. Please bring blood sugar monitor to each visit. Recommended blood sugar levels about 2 hours after meal is 140-180 and on waking up 90-130  Restart exercise

## 2014-05-10 LAB — LIPID PANEL
Chol/HDL Ratio: 4.1 ratio units (ref 0.0–5.0)
Cholesterol, Total: 158 mg/dL (ref 100–199)
HDL: 39 mg/dL — ABNORMAL LOW (ref >39)
LDL Calculated: 97 mg/dL (ref 0–99)
Triglycerides: 110 mg/dL (ref 0–149)
VLDL Cholesterol Cal: 22 mg/dL (ref 5–40)

## 2014-05-10 LAB — COMPREHENSIVE METABOLIC PANEL
ALT: 57 IU/L — ABNORMAL HIGH (ref 0–44)
AST: 32 IU/L (ref 0–40)
Albumin/Globulin Ratio: 1.6 (ref 1.1–2.5)
Albumin: 4.3 g/dL (ref 3.5–5.5)
Alkaline Phosphatase: 73 IU/L (ref 39–117)
BUN/Creatinine Ratio: 11 (ref 9–20)
BUN: 10 mg/dL (ref 6–24)
Bilirubin Total: 0.6 mg/dL (ref 0.0–1.2)
CO2: 24 mmol/L (ref 18–29)
Calcium: 10.2 mg/dL (ref 8.7–10.2)
Chloride: 102 mmol/L (ref 97–108)
Creatinine, Ser: 0.9 mg/dL (ref 0.76–1.27)
GFR calc Af Amer: 122 mL/min/{1.73_m2} (ref >59)
GFR calc non Af Amer: 106 mL/min/{1.73_m2} (ref >59)
Globulin, Total: 2.7 g/dL (ref 1.5–4.5)
Glucose: 115 mg/dL — ABNORMAL HIGH (ref 65–99)
Potassium: 4.3 mmol/L (ref 3.5–5.2)
Sodium: 143 mmol/L (ref 134–144)
Total Protein: 7 g/dL (ref 6.0–8.5)

## 2014-05-10 LAB — HEMOGLOBIN A1C
Est. average glucose Bld gHb Est-mCnc: 171 mg/dL
Hgb A1c MFr Bld: 7.6 % — ABNORMAL HIGH (ref 4.8–5.6)

## 2014-05-15 NOTE — Progress Notes (Signed)
Quick Note:  Please let patient know that the A1c is higher at 7.6, needs to be seen in follow-up in 3 months with labs  ______

## 2014-07-21 ENCOUNTER — Telehealth: Payer: Self-pay | Admitting: Endocrinology

## 2014-07-21 ENCOUNTER — Other Ambulatory Visit: Payer: Self-pay | Admitting: *Deleted

## 2014-07-21 DIAGNOSIS — IMO0002 Reserved for concepts with insufficient information to code with codable children: Secondary | ICD-10-CM

## 2014-07-21 DIAGNOSIS — E1165 Type 2 diabetes mellitus with hyperglycemia: Secondary | ICD-10-CM

## 2014-07-21 MED ORDER — LIRAGLUTIDE 18 MG/3ML ~~LOC~~ SOPN: 1.8000 mg | PEN_INJECTOR | Freq: Every day | SUBCUTANEOUS | Status: DC

## 2014-07-21 MED ORDER — DAPAGLIFLOZIN PRO-METFORMIN ER 5-1000 MG PO TB24: 2.0000 | ORAL_TABLET | Freq: Every day | ORAL | Status: DC

## 2014-07-21 NOTE — Telephone Encounter (Signed)
Pt needs refills on victoza and xigbuo called into [facility] main out pt pharmacy # [phone]

## 2014-08-15 ENCOUNTER — Ambulatory Visit: Payer: 59 | Admitting: Endocrinology

## 2014-08-15 ENCOUNTER — Telehealth: Payer: Self-pay | Admitting: Endocrinology

## 2014-08-15 NOTE — Telephone Encounter (Signed)
Patient no showed today's appt. Please advise on how to follow up. °A. No follow up necessary. °B. Follow up urgent. Contact patient immediately. °C. Follow up necessary. Contact patient and schedule visit in ___ days. °D. Follow up advised. Contact patient and schedule visit in ____weeks. ° °

## 2014-08-16 ENCOUNTER — Encounter: Payer: Self-pay | Admitting: *Deleted

## 2014-08-16 NOTE — Telephone Encounter (Signed)
Letter mailed

## 2014-09-30 ENCOUNTER — Encounter: Payer: 59 | Admitting: Internal Medicine

## 2014-09-30 DIAGNOSIS — Z0289 Encounter for other administrative examinations: Secondary | ICD-10-CM

## 2015-03-13 ENCOUNTER — Telehealth: Payer: Self-pay | Admitting: Endocrinology

## 2015-03-13 ENCOUNTER — Other Ambulatory Visit: Payer: Self-pay | Admitting: *Deleted

## 2015-03-13 MED ORDER — LIRAGLUTIDE 18 MG/3ML ~~LOC~~ SOPN: 1.8000 mg | PEN_INJECTOR | Freq: Every day | SUBCUTANEOUS | Status: DC

## 2015-03-13 NOTE — Telephone Encounter (Signed)
Pt needs victoza called into [facility] on [address]

## 2015-03-13 NOTE — Telephone Encounter (Signed)
He can have a 30 day supply with no refill

## 2015-03-13 NOTE — Telephone Encounter (Signed)
Patient stated that his pharmacy has faxed over a Prior Auth for  Medication Victoza.Marland Kitchen

## 2015-03-13 NOTE — Telephone Encounter (Signed)
He's scheduled an appt for 2/20, but he hasn't been seen since April, please advise. He's no showed once or twice and cancelled numerous times.

## 2015-03-14 NOTE — Telephone Encounter (Signed)
P,A has been submitted, waiting on approval.

## 2015-03-27 ENCOUNTER — Encounter: Payer: Self-pay | Admitting: Endocrinology

## 2015-03-27 ENCOUNTER — Ambulatory Visit: Payer: 59 | Admitting: Endocrinology

## 2015-03-27 ENCOUNTER — Other Ambulatory Visit: Payer: Self-pay | Admitting: *Deleted

## 2015-03-27 ENCOUNTER — Ambulatory Visit (INDEPENDENT_AMBULATORY_CARE_PROVIDER_SITE_OTHER): Payer: 59 | Admitting: Endocrinology

## 2015-03-27 VITALS — BP 134/92 | HR 89 | Temp 97.9°F | Resp 16 | Ht 72.0 in | Wt 317.6 lb

## 2015-03-27 DIAGNOSIS — IMO0001 Reserved for inherently not codable concepts without codable children: Secondary | ICD-10-CM

## 2015-03-27 DIAGNOSIS — E042 Nontoxic multinodular goiter: Secondary | ICD-10-CM

## 2015-03-27 DIAGNOSIS — Z794 Long term (current) use of insulin: Secondary | ICD-10-CM | POA: Diagnosis not present

## 2015-03-27 DIAGNOSIS — E1165 Type 2 diabetes mellitus with hyperglycemia: Principal | ICD-10-CM

## 2015-03-27 DIAGNOSIS — E7800 Pure hypercholesterolemia, unspecified: Secondary | ICD-10-CM | POA: Diagnosis not present

## 2015-03-27 DIAGNOSIS — E118 Type 2 diabetes mellitus with unspecified complications: Secondary | ICD-10-CM | POA: Diagnosis not present

## 2015-03-27 DIAGNOSIS — IMO0002 Reserved for concepts with insufficient information to code with codable children: Secondary | ICD-10-CM

## 2015-03-27 LAB — LIPID PANEL
Cholesterol: 184 mg/dL (ref 0–200)
HDL: 41.1 mg/dL (ref >39.00)
LDL Cholesterol: 122 mg/dL — ABNORMAL HIGH (ref 0–99)
NonHDL: 142.8
Total CHOL/HDL Ratio: 4
Triglycerides: 102 mg/dL (ref 0.0–149.0)
VLDL: 20.4 mg/dL (ref 0.0–40.0)

## 2015-03-27 LAB — BASIC METABOLIC PANEL
BUN: 10 mg/dL (ref 6–23)
CO2: 28 mEq/L (ref 19–32)
Calcium: 9.9 mg/dL (ref 8.4–10.5)
Chloride: 102 mEq/L (ref 96–112)
Creatinine, Ser: 0.89 mg/dL (ref 0.40–1.50)
GFR: 120.1 mL/min (ref >60.00)
Glucose, Bld: 127 mg/dL — ABNORMAL HIGH (ref 70–99)
Potassium: 3.7 mEq/L (ref 3.5–5.1)
Sodium: 138 mEq/L (ref 135–145)

## 2015-03-27 LAB — POCT GLYCOSYLATED HEMOGLOBIN (HGB A1C): Hemoglobin A1C: 9.2

## 2015-03-27 LAB — TSH: TSH: 0.76 u[IU]/mL (ref 0.35–4.50)

## 2015-03-27 MED ORDER — DAPAGLIFLOZIN PRO-METFORMIN ER 5-1000 MG PO TB24: 2.0000 | ORAL_TABLET | Freq: Every day | ORAL | Status: DC

## 2015-03-27 MED ORDER — GLUCOSE BLOOD VI STRP: ORAL_STRIP | Status: DC

## 2015-03-27 MED ORDER — LIRAGLUTIDE 18 MG/3ML ~~LOC~~ SOPN: 1.8000 mg | PEN_INJECTOR | Freq: Every day | SUBCUTANEOUS | Status: DC

## 2015-03-27 NOTE — Progress Notes (Signed)
Patient ID: Tyler Benton, male   DOB: [birth year], 45 y.o.   MRN: [id number]            Reason for Appointment: Follow-up for Type 2 Diabetes  Referring physician: Sanda Linger  History of Present Illness:          Diagnosis: Type 2 diabetes mellitus, date of diagnosis: 2014       Past history: He had routine labs in 2014 which showed A1c of 6.6 and glucose of 158 fasting This was felt to be borderline and he was not started on any medication He was given Belviq for weight loss but he did not continue this after 2 weeks since he did not help and he found it expensive He was referred for evaluation because of poor blood sugar control in 8/15 and his blood sugar was significantly high with fasting glucose of 266 and A1c of 9.2 This was despite his trying to lose weight and he had lost 30 pounds along with exercising regularly His PCP had started treatment of his diabetes with a combination treatment using Victoza and Xigduo  Recent history:   He was last seen in 05/2014 when his A1c had started going up to 7.6 At that time he was having some difficulty with getting his medication prescriptions authorized and also was somewhat noncompliant with checking his blood sugars and exercise/diet  Current management, blood sugar patterns and problems identified:  He says he was in denial about his diabetes and was very irregular with his medications last year after his visit here  When he started having increased thirst and urination and fatigue with high blood sugars he called to get his prescriptions again and has only started back in the last week.  His glucose readings are over 300 in December with only 2 readings  Has checked blood sugars only in the last couple of days at home and had a 116 glucose this morning and 153 after breakfast yesterday.  He also has lost weight but she thinks this is from regular exercise  He is generally avoiding drinks with sugar  Occasionally eating fast  food when working No side effects with restarting Victoza 1.8 mg in the last few days, did not titrate from the lower dose         Non-insulin hypoglycemic drugs the patient is taking are: Xigduo 06/998, 2 tablets daily and Victoza 1.8 mg daily      Side effects from medications have been:  none Compliance with the medical regimen: fair  Glucose monitoring:  done irregularly        Glucometer: One touch Verio   Blood Glucose readings from recall: As above   Self-care: He has been consistent with diet mostly recently Previously was doing well  after consultation with dietitian    Meals: 3 meals per day. Breakfast is shake, lunch is frequently salad or sandwich. Dinner usually sandwich, occ pizza; snacks on popcorn and peanuts          Exercise: At Gym 7 /7 days a week, up to one hour         Dietician visit, most recent: 10/15            Weight history: was 380 in 2014  Wt Readings from Last 3 Encounters:  03/27/15 317 lb 9.6 oz (144.062 kg)  05/06/14 342 lb 12.8 oz (155.493 kg)  01/12/14 345 lb 6.4 oz (156.672 kg)    Glycemic control:   Lab Results  Component Value  Date   HGBA1C 9.2 03/27/2015   HGBA1C 7.6* 05/06/2014   HGBA1C 6.3 01/12/2014   Lab Results  Component Value Date   LDLCALC 97 05/06/2014   CREATININE 0.90 05/06/2014        Medication List       This list is accurate as of: 03/27/15  4:08 PM.  Always use your most recent med list.               atorvastatin 10 MG tablet  Commonly known as:  LIPITOR  Take 1 tablet (10 mg total) by mouth daily.     Dapagliflozin-Metformin HCl ER 06-998 MG Tb24  Commonly known as:  XIGDUO XR  Take 2 tablets by mouth daily.     glucose blood test strip  Commonly known as:  ONETOUCH VERIO  Use to test blood sugar 1 time a day as instructed.Dx code E11.65     Liraglutide 18 MG/3ML Sopn  Inject 0.3 mLs (1.8 mg total) as directed daily.     ranitidine 150 MG tablet  Commonly known as:  ZANTAC  Take 150 mg by  mouth 2 (two) times daily.        Allergies: No Known Allergies  Past Medical History  Diagnosis Date  . GERD (gastroesophageal reflux disease)   . Diabetes mellitus without complication (HCC)   . Obesity     Past Surgical History  Procedure Laterality Date  . Growth plate surgery Bilateral 1980s  . Wisdom tooth extraction      Family History  Problem Relation Age of Onset  . Heart disease Mother   . Hypertension Mother   . Early death Neg Hx   . Alcohol abuse Neg Hx   . Birth defects Neg Hx   . Cancer Neg Hx   . COPD Neg Hx   . Drug abuse Neg Hx   . Hyperlipidemia Neg Hx   . Kidney disease Neg Hx   . Stroke Neg Hx   . Thyroid disease Neg Hx   . Diabetes Maternal Grandfather     Social History:  reports that he has never smoked. He has never used smokeless tobacco. He reports that he does not drink alcohol or use illicit drugs.    Review of Systems       Most recent eye exam was  01/31/14       Lipids: He has been started Lipitor 10 mg daily and needs follow-up levels.  Baseline LDL has been in the 140s      Lab Results  Component Value Date   CHOL 158 05/06/2014   HDL 39* 05/06/2014   LDLCALC 97 05/06/2014   LDLDIRECT 141.3 01/12/2014   TRIG 110 05/06/2014   CHOLHDL 4.1 05/06/2014                  Thyroid:  No  unusual fatigue.   He was found to have left-sided thyroid nodules on exam and this was confirmed on his ultrasound   Does have a dominant nodule about 2.5 cm which was supposed to be biopsied after his ultrasound in 9/15 Apparently did not get scheduled for needle biopsy of a left-sided dominant nodule      The blood pressure again appears to be high in the office today Previously has been normal when he checks at home or work and   most recent readings have been about 130 systolic/76-80 diastolic Has not been on any medications so far     LABS:  Office  Visit on 03/27/2015  Component Date Value Ref Range Status  . Hemoglobin A1C  03/27/2015 9.2   Final    Physical Examination:  BP 134/92 mmHg  Pulse 89  Temp(Src) 97.9 F (36.6 C)  Resp 16  Ht 6' (1.829 m)  Wt 317 lb 9.6 oz (144.062 kg)  BMI 43.06 kg/m2  SpO2 93%  Repeat blood pressure 140/94 with large cuff    Left thyroid nodule is about 2.5-3 cm  Diabetic Foot Exam - Simple   Simple Foot Form  Diabetic Foot exam was performed with the following findings:  Yes 03/27/2015  3:47 PM  Visual Inspection  No deformities, no ulcerations, no other skin breakdown bilaterally:  Yes  Sensation Testing  Intact to touch and monofilament testing bilaterally:  Yes  Pulse Check  Posterior Tibialis and Dorsalis pulse intact bilaterally:  Yes  Comments       ASSESSMENT/PLAN:  Diabetes type 2, uncontrolled  with BMI 43 See history of present illness for detailed discussion of his current management, blood sugar patterns and problems identified Because of total noncompliance with his medications, diet and monitoring his A1c is now over 9%  He thinks he is more motivated to start taking care of himself and take his medications regularly  He is on a regimen of Xigduo and Victoza which he restarted about a week ago and his blood sugars appear to be getting back to normal now He has lost weight since last visit, not clear if this is partly from hyperglycemia  For now will not change his regimen but have monitor his blood sugars especially after meals and follow-up in 6 weeks Discussed blood sugar targets  ?  HYPERTENSION: Blood pressure is again high,  followed by PCP He thinks his blood pressure has been normal at work or elsewhere and he has white coat syndrome Also may continue to improve with staying on Farxiga  HYPERCHOLESTEROLEMIA: recently has been off his Lipitor and will check his labs again Results will be discussed again after getting his labs today  THYROID nodule/multinodular goiter:  Again did not follow-up on his ultrasound which was requested for  comparison Discussed that he may need a biopsy of the nodule is growing  Counseling time on subjects discussed above is over 50% of today's 25 minute visit   Patient Instructions  Check blood sugars on waking up 2-3  times a week Also check blood sugars about 2 hours after a meal and do this after different meals by rotation  Recommended blood sugar levels on waking up is 90-130 and about 2 hours after meal is 130-160  Please bring your blood sugar monitor to each visit, thank you       [facility] 03/27/2015, 4:08 PM   Note: This office note was prepared with Dragon voice recognition system technology. Any transcriptional errors that result from this process are unintentional.

## 2015-03-27 NOTE — Patient Instructions (Signed)
Check blood sugars on waking up 2-3 times a week Also check blood sugars about 2 hours after a meal and do this after different meals by rotation  Recommended blood sugar levels on waking up is 90-130 and about 2 hours after meal is 130-160  Please bring your blood sugar monitor to each visit, thank you  

## 2015-03-28 ENCOUNTER — Other Ambulatory Visit: Payer: Self-pay | Admitting: *Deleted

## 2015-03-28 ENCOUNTER — Other Ambulatory Visit: Payer: Self-pay | Admitting: Endocrinology

## 2015-03-28 DIAGNOSIS — E041 Nontoxic single thyroid nodule: Secondary | ICD-10-CM

## 2015-03-28 MED ORDER — ATORVASTATIN CALCIUM 10 MG PO TABS: 10.0000 mg | ORAL_TABLET | Freq: Every day | ORAL | Status: DC

## 2015-03-28 NOTE — Progress Notes (Signed)
Quick Note:  Please let patient know that the cholesterol is high, start back on Lipitor 10 mg, needs Rx. Kidney tests okay ______

## 2015-04-10 ENCOUNTER — Ambulatory Visit (HOSPITAL_BASED_OUTPATIENT_CLINIC_OR_DEPARTMENT_OTHER)
Admission: RE | Admit: 2015-04-10 | Discharge: 2015-04-10 | Disposition: A | Discharge status: Home / Self Care | Payer: 59 | Source: Ambulatory Visit | Attending: Endocrinology | Admitting: Endocrinology

## 2015-04-10 DIAGNOSIS — E041 Nontoxic single thyroid nodule: Secondary | ICD-10-CM

## 2015-04-10 DIAGNOSIS — E042 Nontoxic multinodular goiter: Secondary | ICD-10-CM | POA: Insufficient documentation

## 2015-04-11 NOTE — Progress Notes (Signed)
Quick Note:  Please let patient know that the thyroid nodule is larger now and will need to schedule needle aspiration biopsy. Will order this if he agrees ______

## 2015-04-12 ENCOUNTER — Telehealth: Payer: Self-pay | Admitting: Endocrinology

## 2015-04-12 ENCOUNTER — Other Ambulatory Visit: Payer: Self-pay | Admitting: *Deleted

## 2015-04-12 MED ORDER — LIRAGLUTIDE 18 MG/3ML ~~LOC~~ SOPN: 1.8000 mg | PEN_INJECTOR | Freq: Every day | SUBCUTANEOUS | Status: DC

## 2015-04-12 NOTE — Telephone Encounter (Signed)
Regarding the victoza rx that the pharmacy has we need to call it in for a 30 day script to [facility] please.

## 2015-04-12 NOTE — Telephone Encounter (Signed)
It was sent in for a 30 day supply this morning.

## 2015-04-14 ENCOUNTER — Other Ambulatory Visit: Payer: Self-pay | Admitting: Endocrinology

## 2015-04-14 DIAGNOSIS — E041 Nontoxic single thyroid nodule: Secondary | ICD-10-CM

## 2015-04-25 ENCOUNTER — Other Ambulatory Visit (HOSPITAL_COMMUNITY)
Admission: RE | Admit: 2015-04-25 | Discharge: 2015-04-25 | Disposition: A | Discharge status: Home / Self Care | Payer: 59 | Source: Ambulatory Visit | Attending: Diagnostic Radiology | Admitting: Diagnostic Radiology

## 2015-04-25 ENCOUNTER — Ambulatory Visit
Admission: RE | Admit: 2015-04-25 | Discharge: 2015-04-25 | Disposition: A | Discharge status: Home / Self Care | Payer: 59 | Source: Ambulatory Visit | Attending: Endocrinology | Admitting: Endocrinology

## 2015-04-25 DIAGNOSIS — E041 Nontoxic single thyroid nodule: Secondary | ICD-10-CM | POA: Diagnosis not present

## 2015-04-28 NOTE — Progress Notes (Signed)
Quick Note:  Please let patient know that the pathology shows benign cells and no further action needed ______

## 2015-05-08 ENCOUNTER — Other Ambulatory Visit: Payer: Self-pay | Admitting: *Deleted

## 2015-05-08 ENCOUNTER — Other Ambulatory Visit (INDEPENDENT_AMBULATORY_CARE_PROVIDER_SITE_OTHER): Payer: 59

## 2015-05-08 ENCOUNTER — Encounter: Payer: Self-pay | Admitting: Endocrinology

## 2015-05-08 ENCOUNTER — Ambulatory Visit: Payer: 59 | Admitting: Endocrinology

## 2015-05-08 ENCOUNTER — Ambulatory Visit (INDEPENDENT_AMBULATORY_CARE_PROVIDER_SITE_OTHER): Payer: 59 | Admitting: Endocrinology

## 2015-05-08 VITALS — BP 126/82 | HR 72 | Temp 97.7°F | Resp 14 | Ht 72.0 in | Wt 317.2 lb

## 2015-05-08 DIAGNOSIS — E1165 Type 2 diabetes mellitus with hyperglycemia: Secondary | ICD-10-CM | POA: Diagnosis not present

## 2015-05-08 DIAGNOSIS — Z794 Long term (current) use of insulin: Secondary | ICD-10-CM | POA: Diagnosis not present

## 2015-05-08 DIAGNOSIS — IMO0001 Reserved for inherently not codable concepts without codable children: Secondary | ICD-10-CM

## 2015-05-08 LAB — COMPREHENSIVE METABOLIC PANEL
ALT: 56 U/L — ABNORMAL HIGH (ref 0–53)
AST: 47 U/L — ABNORMAL HIGH (ref 0–37)
Albumin: 4 g/dL (ref 3.5–5.2)
Alkaline Phosphatase: 66 U/L (ref 39–117)
BUN: 7 mg/dL (ref 6–23)
CO2: 32 mEq/L (ref 19–32)
Calcium: 9.8 mg/dL (ref 8.4–10.5)
Chloride: 102 mEq/L (ref 96–112)
Creatinine, Ser: 0.77 mg/dL (ref 0.40–1.50)
GFR: 141.87 mL/min (ref >60.00)
Glucose, Bld: 108 mg/dL — ABNORMAL HIGH (ref 70–99)
Potassium: 3.6 mEq/L (ref 3.5–5.1)
Sodium: 142 mEq/L (ref 135–145)
Total Bilirubin: 1.2 mg/dL (ref 0.2–1.2)
Total Protein: 7.8 g/dL (ref 6.0–8.3)

## 2015-05-08 LAB — URINALYSIS
Bilirubin Urine: NEGATIVE
Leukocytes, UA: NEGATIVE
Nitrite: NEGATIVE
Specific Gravity, Urine: 1.015 (ref 1.000–1.030)
Total Protein, Urine: NEGATIVE
Urine Glucose: >=1000 — AB
Urobilinogen, UA: 1 (ref 0.0–1.0)
pH: 6 (ref 5.0–8.0)

## 2015-05-08 LAB — POCT URINALYSIS DIPSTICK
Glucose, UA: 2000
Leukocytes, UA: NEGATIVE
Nitrite, UA: NEGATIVE
Spec Grav, UA: 1.02
Urobilinogen, UA: 1
pH, UA: 5

## 2015-05-08 LAB — LIPID PANEL
Cholesterol: 133 mg/dL (ref 0–200)
HDL: 30.2 mg/dL — ABNORMAL LOW (ref >39.00)
LDL Cholesterol: 85 mg/dL (ref 0–99)
NonHDL: 102.33
Total CHOL/HDL Ratio: 4
Triglycerides: 85 mg/dL (ref 0.0–149.0)
VLDL: 17 mg/dL (ref 0.0–40.0)

## 2015-05-08 NOTE — Progress Notes (Signed)
Quick Note:  Please let patient know that the lab result is normal and no further action needed, he can call PCP Send full U/a ______

## 2015-05-08 NOTE — Patient Instructions (Signed)
Check blood sugars on waking up 3  times a week  Also check blood sugars about 2 hours after a meal and do this after different meals by rotation  Recommended blood sugar levels on waking up is 90-130 and about 2 hours after meal is 130-160  Please bring your blood sugar monitor to each visit, thank you  

## 2015-05-08 NOTE — Progress Notes (Signed)
Patient ID: Tyler Benton, male   DOB: [birth year], 45 y.o.   MRN: [id number]            Reason for Appointment: Follow-up for Type 2 Diabetes  Referring physician: Sanda Lingerhomas Jones  History of Present Illness:          Diagnosis: Type 2 diabetes mellitus, date of diagnosis: 2014       Past history: He had routine labs in 2014 which showed A1c of 6.6 and glucose of 158 fasting This was felt to be borderline and he was not started on any medication He was given Belviq for weight loss but he did not continue this after 2 weeks since he did not help and he found it expensive He was referred for evaluation because of poor blood sugar control in 8/15 and his blood sugar was significantly high with fasting glucose of 266 and A1c of 9.2 This was despite his trying to lose weight and he had lost 30 pounds along with exercising regularly His PCP had started treatment of his diabetes with a combination treatment using Victoza and Xigduo  Recent history:   He was having poor control of his diabetes with A1c 9.2 in 2/17 because of noncompliance with on measures of self-care and medications and having symptomatic hyperglycemia He is more motivated to take care of his diabetes now        Current management, blood sugar patterns and problems identified:  He has checked his blood sugar very sporadically  He may tend to have higher readings fasting compared to the afternoon and evening even with his taking all his medications including 2000 mg of metformin  His glucose is much better and his weight has held steady  Continues to do well with exercise  He thinks he is doing better with his diet and avoiding snacks at night  He is generally avoiding drinks with sugar  Occasionally eating fast food when working No side effects with restarting Victoza 1.8 mg Non-insulin hypoglycemic drugs the patient is taking are: Xigduo 06/998, 2 tablets daily and Victoza 1.8 mg daily      Side effects from  medications have been:  none Compliance with the medical regimen: fair  Glucose monitoring:  done irregularly        Glucometer: One touch Verio   Blood Glucose readings from  Mean values apply above for all meters except median for One Touch  PRE-MEAL Fasting Lunch Dinner Bedtime Overall  Glucose range: 144-174   119-169    Mean/median: 159     137   Self-care: He has been consistent with diet mostly recently Previously was doing well  after consultation with dietitian    Meals: 3 meals per day. Breakfast is protein shake, lunch is frequently salad or sandwich. Dinner usually sandwich, occ pizza; snacks on popcorn and peanuts          Exercise: At Gym 7 /7 days a week, up to one hour         Dietician visit, most recent: 10/15            Weight history: was 380 in 2014  Wt Readings from Last 3 Encounters:  05/08/15 317 lb 3.2 oz (143.881 kg)  03/27/15 317 lb 9.6 oz (144.062 kg)  05/06/14 342 lb 12.8 oz (155.493 kg)    Glycemic control:   Lab Results  Component Value Date   HGBA1C 9.2 03/27/2015   HGBA1C 7.6* 05/06/2014   HGBA1C 6.3 01/12/2014   Lab  Results  Component Value Date   LDLCALC 85 05/08/2015   CREATININE 0.77 05/08/2015        Medication List       This list is accurate as of: 05/08/15 11:59 PM.  Always use your most recent med list.               atorvastatin 10 MG tablet  Commonly known as:  LIPITOR  Take 1 tablet (10 mg total) by mouth daily.     Dapagliflozin-Metformin HCl ER 06-998 MG Tb24  Commonly known as:  XIGDUO XR  Take 2 tablets by mouth daily.     glucose blood test strip  Commonly known as:  ONETOUCH VERIO  Use to test blood sugar 1 time a day as instructed.Dx code E11.65     Liraglutide 18 MG/3ML Sopn  Inject 0.3 mLs (1.8 mg total) into the skin daily.     ranitidine 150 MG tablet  Commonly known as:  ZANTAC  Take 150 mg by mouth 2 (two) times daily.        Allergies: No Known Allergies  Past Medical History    Diagnosis Date  . GERD (gastroesophageal reflux disease)   . Diabetes mellitus without complication (HCC)   . Obesity     Past Surgical History  Procedure Laterality Date  . Growth plate surgery Bilateral 1980s  . Wisdom tooth extraction      Family History  Problem Relation Age of Onset  . Heart disease Mother   . Hypertension Mother   . Early death Neg Hx   . Alcohol abuse Neg Hx   . Birth defects Neg Hx   . Cancer Neg Hx   . COPD Neg Hx   . Drug abuse Neg Hx   . Hyperlipidemia Neg Hx   . Kidney disease Neg Hx   . Stroke Neg Hx   . Thyroid disease Neg Hx   . Diabetes Maternal Grandfather     Social History:  reports that he has never smoked. He has never used smokeless tobacco. He reports that he does not drink alcohol or use illicit drugs.    Review of Systems   Today he is asking about possible urinary tract infection, he had an episode 2 or 3 years ago He is having nonspecific symptoms of bilateral lower abdominal discomfort, no significant burning, some frequency of urination and urine not looking clear      Most recent eye exam was  01/31/14       Lipids: He has been started Lipitor 10 mg daily and needs follow-up levels.  Baseline LDL has been in the 140s      Lab Results  Component Value Date   CHOL 133 05/08/2015   HDL 30.20* 05/08/2015   LDLCALC 85 05/08/2015   LDLDIRECT 141.3 01/12/2014   TRIG 85.0 05/08/2015   CHOLHDL 4 05/08/2015                  Thyroid:  No  unusual fatigue.   He was found to have left-sided thyroid nodule on exam and this was benign on biopsy On exam he has a 2.5-3 cm nodule on the left side      The blood pressure again appears to be better in the office today Not on any antihypertensive     LABS:  Lab on 05/08/2015  Component Date Value Ref Range Status  . Sodium 05/08/2015 142  135 - 145 mEq/L Final  . Potassium 05/08/2015 3.6  3.5 -  5.1 mEq/L Final  . Chloride 05/08/2015 102  96 - 112 mEq/L Final  . CO2  05/08/2015 32  19 - 32 mEq/L Final  . Glucose, Bld 05/08/2015 108* 70 - 99 mg/dL Final  . BUN 16/11/9602 7  6 - 23 mg/dL Final  . Creatinine, Ser 05/08/2015 0.77  0.40 - 1.50 mg/dL Final  . Total Bilirubin 05/08/2015 1.2  0.2 - 1.2 mg/dL Final  . Alkaline Phosphatase 05/08/2015 66  39 - 117 U/L Final  . AST 05/08/2015 47* 0 - 37 U/L Final  . ALT 05/08/2015 56* 0 - 53 U/L Final  . Total Protein 05/08/2015 7.8  6.0 - 8.3 g/dL Final  . Albumin 54/10/8117 4.0  3.5 - 5.2 g/dL Final  . Calcium 14/78/2956 9.8  8.4 - 10.5 mg/dL Final  . GFR 21/30/8657 141.87  >60.00 mL/min Final  . Fructosamine 05/08/2015 235  0 - 285 umol/L Final   Comment: Published reference interval for apparently healthy subjects between age 61 and 60 is 66 - 285 umol/L and in a poorly controlled diabetic population is 228 - 563 umol/L with a mean of 396 umol/L.   Marland Kitchen Color, UA 05/08/2015 yellow   Final  . Clarity, UA 05/08/2015 clear   Final  . Glucose, UA 05/08/2015 2000 or more   Final  . Bilirubin, UA 05/08/2015 small   Final  . Ketones, UA 05/08/2015 small   Final  . Spec Grav, UA 05/08/2015 1.020   Final  . Blood, UA 05/08/2015 trace   Final  . pH, UA 05/08/2015 5.0   Final  . Urobilinogen, UA 05/08/2015 1.0   Final  . Nitrite, UA 05/08/2015 negative   Final  . Leukocytes, UA 05/08/2015 Negative  Negative Final  . Cholesterol 05/08/2015 133  0 - 200 mg/dL Final   ATP III Classification       Desirable:  < 200 mg/dL               Borderline High:  200 - 239 mg/dL          High:  > = 846 mg/dL  . Triglycerides 05/08/2015 85.0  0.0 - 149.0 mg/dL Final   Normal:  <962 mg/dLBorderline High:  150 - 199 mg/dL  . HDL 05/08/2015 30.20* >39.00 mg/dL Final  . VLDL 95/28/4132 17.0  0.0 - 40.0 mg/dL Final  . LDL Cholesterol 05/08/2015 85  0 - 99 mg/dL Final  . Total CHOL/HDL Ratio 05/08/2015 4   Final                  Men          Women1/2 Average Risk     3.4          3.3Average Risk          5.0          4.42X Average  Risk          9.6          7.13X Average Risk          15.0          11.0                      . NonHDL 05/08/2015 102.33   Final   NOTE:  Non-HDL goal should be 30 mg/dL higher than patient's LDL goal (i.e. LDL goal of < 70 mg/dL, would have non-HDL goal of < 100 mg/dL)  . Color, Urine 05/08/2015  YELLOW  Yellow;Lt. Yellow Final  . APPearance 05/08/2015 CLEAR  Clear Final  . Specific Gravity, Urine 05/08/2015 1.015  1.000-1.030 Final  . pH 05/08/2015 6.0  5.0 - 8.0 Final  . Total Protein, Urine 05/08/2015 NEGATIVE  Negative Final  . Urine Glucose 05/08/2015 >=1000* Negative Final  . Ketones, ur 05/08/2015 TRACE* Negative Final  . Bilirubin Urine 05/08/2015 NEGATIVE  Negative Final  . Hgb urine dipstick 05/08/2015 TRACE-INTACT* Negative Final  . Urobilinogen, UA 05/08/2015 1.0  0.0 - 1.0 Final  . Leukocytes, UA 05/08/2015 NEGATIVE  Negative Final  . Nitrite 05/08/2015 NEGATIVE  Negative Final    Physical Examination:  BP 126/82 mmHg  Pulse 72  Temp(Src) 97.7 F (36.5 C)  Resp 14  Ht 6' (1.829 m)  Wt 317 lb 3.2 oz (143.881 kg)  BMI 43.01 kg/m2  SpO2 94%    Diabetic Foot Exam - Simple   No data filed       ASSESSMENT/PLAN:  Diabetes type 2, uncontrolled  with BMI 43 See history of present illness for detailed discussion of his current management, blood sugar patterns and problems identified His blood sugars are improving significantly since resuming all his medications  He thinks he is more motivated to start taking care of himself and take his medications regularly  He is on a regimen of Xigduo and Victoza  Also his weight has leveled off even with better blood sugar control For some reason his fasting readings tend to be moderately high  For now will not change his regimen but have monitor his blood sugars especially after meals and follow-up in 6 weeks Discussed blood sugar targets  ?  HYPERTENSION: Blood pressure is improved  HYPERCHOLESTEROLEMIA: recently has  been off his Lipitor and will check his labs again  THYROID nodule/multinodular goiter:  Will follow this clinically as the nodule is benign  ?  UTI: Will check urinalysis today    Patient Instructions  Check blood sugars on waking up 3  times a week Also check blood sugars about 2 hours after a meal and do this after different meals by rotation  Recommended blood sugar levels on waking up is 90-130 and about 2 hours after meal is 130-160  Please bring your blood sugar monitor to each visit, thank you       Addendum: Urinalysis is normal  Abimbola Aki 05/09/2015, 7:54 AM   Note: This office note was prepared with Dragon voice recognition system technology. Any transcriptional errors that result from this process are unintentional.     ,

## 2015-05-09 LAB — FRUCTOSAMINE: Fructosamine: 235 umol/L (ref 0–285)

## 2015-05-15 ENCOUNTER — Telehealth: Payer: Self-pay | Admitting: Endocrinology

## 2015-05-15 ENCOUNTER — Other Ambulatory Visit: Payer: Self-pay | Admitting: *Deleted

## 2015-05-15 MED ORDER — LIRAGLUTIDE 18 MG/3ML ~~LOC~~ SOPN: 1.8000 mg | PEN_INJECTOR | Freq: Every day | SUBCUTANEOUS | Status: DC

## 2015-05-15 NOTE — Telephone Encounter (Signed)
Pt needs refill on victoza [facility] please

## 2015-05-15 NOTE — Telephone Encounter (Signed)
Rx sent 

## 2015-05-17 ENCOUNTER — Other Ambulatory Visit: Payer: Self-pay | Admitting: *Deleted

## 2015-05-18 ENCOUNTER — Telehealth: Payer: Self-pay | Admitting: Endocrinology

## 2015-05-18 ENCOUNTER — Other Ambulatory Visit: Payer: Self-pay | Admitting: *Deleted

## 2015-05-18 MED ORDER — LIRAGLUTIDE 18 MG/3ML ~~LOC~~ SOPN: 1.8000 mg | PEN_INJECTOR | Freq: Every day | SUBCUTANEOUS | Status: DC

## 2015-05-18 NOTE — Telephone Encounter (Signed)
Patient has stopped doing [facility] because they never get his prescription right, he is now using the [facility] in [place].

## 2015-05-18 NOTE — Telephone Encounter (Signed)
Pt requests call back, he said he is having a hard time with his Victoza prescription

## 2015-07-10 ENCOUNTER — Ambulatory Visit: Payer: 59 | Admitting: Endocrinology

## 2015-07-10 ENCOUNTER — Other Ambulatory Visit: Payer: Self-pay | Admitting: *Deleted

## 2015-07-10 ENCOUNTER — Telehealth: Payer: Self-pay | Admitting: Endocrinology

## 2015-07-10 DIAGNOSIS — E1165 Type 2 diabetes mellitus with hyperglycemia: Principal | ICD-10-CM

## 2015-07-10 DIAGNOSIS — IMO0001 Reserved for inherently not codable concepts without codable children: Secondary | ICD-10-CM

## 2015-07-10 MED ORDER — DAPAGLIFLOZIN PRO-METFORMIN ER 5-1000 MG PO TB24: 2.0000 | ORAL_TABLET | Freq: Every day | ORAL | Status: DC

## 2015-07-10 MED ORDER — ATORVASTATIN CALCIUM 10 MG PO TABS: 10.0000 mg | ORAL_TABLET | Freq: Every day | ORAL | Status: DC

## 2015-07-10 MED ORDER — CANAGLIFLOZIN-METFORMIN HCL 150-1000 MG PO TABS
ORAL_TABLET | ORAL | Status: DC
Start: 2015-07-10 — End: 2015-09-18

## 2015-07-10 NOTE — Telephone Encounter (Signed)
Rx's have been sent. 

## 2015-07-10 NOTE — Telephone Encounter (Signed)
Patient would a refill  Of Dapagliflozin-Metformin HCl ER (XIGDUO XR) 06-998 MG TB24 and atorvastatin (LIPITOR) 10 MG tablet [facility] [place] - [address] [phone] (Phone) [phone] (Fax)       Please delete the rest of the pharmacys

## 2015-07-17 ENCOUNTER — Ambulatory Visit: Payer: 59 | Admitting: Endocrinology

## 2015-08-30 ENCOUNTER — Other Ambulatory Visit: Payer: Self-pay | Admitting: Endocrinology

## 2015-08-30 NOTE — Telephone Encounter (Signed)
I don't see these meds on his current med list. Ok to refill?

## 2015-09-13 ENCOUNTER — Telehealth: Payer: Self-pay | Admitting: Endocrinology

## 2015-09-13 DIAGNOSIS — IMO0001 Reserved for inherently not codable concepts without codable children: Secondary | ICD-10-CM

## 2015-09-13 DIAGNOSIS — E1165 Type 2 diabetes mellitus with hyperglycemia: Principal | ICD-10-CM

## 2015-09-13 MED ORDER — DAPAGLIFLOZIN PRO-METFORMIN ER 5-1000 MG PO TB24: 2.0000 | ORAL_TABLET | Freq: Every day | ORAL | 3 refills | Status: DC

## 2015-09-13 MED ORDER — ATORVASTATIN CALCIUM 10 MG PO TABS: 10.0000 mg | ORAL_TABLET | Freq: Every day | ORAL | 3 refills | Status: DC

## 2015-09-13 MED ORDER — LIRAGLUTIDE 18 MG/3ML ~~LOC~~ SOPN: 1.8000 mg | PEN_INJECTOR | Freq: Every day | SUBCUTANEOUS | 3 refills | Status: DC

## 2015-09-13 NOTE — Telephone Encounter (Signed)
Rx submitted per pt's request.  

## 2015-09-13 NOTE — Telephone Encounter (Signed)
Patient need refill of  Victoza and Canagliflozin-Metformin HCl [phone] MG TABS and atorvastatin (LIPITOR) 10 MG tablet [id number] - [place], [place] - [address] [facility] [address]) [phone] (Phone) [phone] (Fax)

## 2015-09-18 ENCOUNTER — Other Ambulatory Visit: Payer: Self-pay | Admitting: Endocrinology

## 2015-09-18 NOTE — Telephone Encounter (Signed)
Patient need refill of medication, invokamet [id number] - [place], [place] - [address] [facility] [address]) [phone] (Phone) [phone] (Fax)

## 2015-11-16 ENCOUNTER — Telehealth: Payer: Self-pay | Admitting: Endocrinology

## 2015-11-16 ENCOUNTER — Other Ambulatory Visit: Payer: Self-pay | Admitting: *Deleted

## 2015-11-16 MED ORDER — CANAGLIFLOZIN-METFORMIN HCL 150-1000 MG PO TABS: ORAL_TABLET | ORAL | 0 refills | Status: DC

## 2015-11-16 NOTE — Telephone Encounter (Signed)
Patient need a refill of INVOKAMET [phone] MG TABS [id number] - [place], [place] - [address] [facility] [address]) [phone] (Phone) [phone] (Fax)

## 2015-11-16 NOTE — Telephone Encounter (Signed)
Rx sent for a 2 week supply only, patient must have an appointment for a full refill

## 2015-12-01 ENCOUNTER — Other Ambulatory Visit: Payer: Self-pay

## 2015-12-01 ENCOUNTER — Encounter: Payer: Self-pay | Admitting: Endocrinology

## 2015-12-01 ENCOUNTER — Ambulatory Visit (INDEPENDENT_AMBULATORY_CARE_PROVIDER_SITE_OTHER): Payer: Managed Care, Other (non HMO) | Admitting: Endocrinology

## 2015-12-01 VITALS — BP 130/96 | HR 94 | Ht 72.0 in | Wt 327.0 lb

## 2015-12-01 DIAGNOSIS — E782 Mixed hyperlipidemia: Secondary | ICD-10-CM | POA: Diagnosis not present

## 2015-12-01 DIAGNOSIS — E1165 Type 2 diabetes mellitus with hyperglycemia: Secondary | ICD-10-CM

## 2015-12-01 DIAGNOSIS — R945 Abnormal results of liver function studies: Secondary | ICD-10-CM

## 2015-12-01 DIAGNOSIS — R7989 Other specified abnormal findings of blood chemistry: Secondary | ICD-10-CM | POA: Diagnosis not present

## 2015-12-01 DIAGNOSIS — Z23 Encounter for immunization: Secondary | ICD-10-CM

## 2015-12-01 DIAGNOSIS — E041 Nontoxic single thyroid nodule: Secondary | ICD-10-CM | POA: Diagnosis not present

## 2015-12-01 LAB — COMPREHENSIVE METABOLIC PANEL
ALT: 31 U/L (ref 0–53)
AST: 30 U/L (ref 0–37)
Albumin: 4.5 g/dL (ref 3.5–5.2)
Alkaline Phosphatase: 61 U/L (ref 39–117)
BUN: 12 mg/dL (ref 6–23)
CO2: 30 mEq/L (ref 19–32)
Calcium: 10.6 mg/dL — ABNORMAL HIGH (ref 8.4–10.5)
Chloride: 103 mEq/L (ref 96–112)
Creatinine, Ser: 0.95 mg/dL (ref 0.40–1.50)
GFR: 111.04 mL/min (ref >60.00)
Glucose, Bld: 110 mg/dL — ABNORMAL HIGH (ref 70–99)
Potassium: 4.3 mEq/L (ref 3.5–5.1)
Sodium: 140 mEq/L (ref 135–145)
Total Bilirubin: 1.2 mg/dL (ref 0.2–1.2)
Total Protein: 8 g/dL (ref 6.0–8.3)

## 2015-12-01 LAB — MICROALBUMIN / CREATININE URINE RATIO
Creatinine,U: 121.5 mg/dL
Microalb Creat Ratio: 0.6 mg/g (ref 0.0–30.0)
Microalb, Ur: <0.7 mg/dL (ref 0.0–1.9)

## 2015-12-01 LAB — LIPID PANEL
Cholesterol: 158 mg/dL (ref 0–200)
HDL: 43 mg/dL (ref >39.00)
LDL Cholesterol: 98 mg/dL (ref 0–99)
NonHDL: 114.86
Total CHOL/HDL Ratio: 4
Triglycerides: 82 mg/dL (ref 0.0–149.0)
VLDL: 16.4 mg/dL (ref 0.0–40.0)

## 2015-12-01 LAB — POCT GLYCOSYLATED HEMOGLOBIN (HGB A1C): Hemoglobin A1C: 5.6

## 2015-12-01 NOTE — Progress Notes (Signed)
Patient ID: Tyler Benton, male   DOB: 12/13/1970, 45 y.o.   MRN: [id number]            Reason for Appointment: Follow-up for Type 2 Diabetes  Referring physician: Sanda Linger  History of Present Illness:          Diagnosis: Type 2 diabetes mellitus, date of diagnosis: 2014       Past history: He had routine labs in 2014 which showed A1c of 6.6 and glucose of 158 fasting This was felt to be borderline and he was not started on any medication He was given Belviq for weight loss but he did not continue this after 2 weeks since he did not help and he found it expensive He was referred for evaluation because of poor blood sugar control in 8/15 and his blood sugar was significantly high with fasting glucose of 266 and A1c of 9.2 This was despite his trying to lose weight and he had lost 30 pounds along with exercising regularly His PCP had started treatment of his diabetes with a combination treatment using Victoza and Xigduo  Recent history:   He was having poor control of his diabetes with A1c 9.2 in 2/17 because of noncompliance with on measures of self-care and medications and having symptomatic hyperglycemia A1c is much better at 5.6 now However he still irregular with his follow-up  Current management, blood sugar patterns and problems identified:  He has checked his blood sugar very sporadically again  He  tends to have higher readings fasting compared to later in the day  He thinks that he takes his Xigduo and Victoza together in the morning he feels feel uneasy or have some abdominal discomfort  However because of working 2 jobs he has stopped exercising and has gained weight probably because of this  He thinks he is doing better with his diet and avoiding snacks, now eating some breakfast instead of protein shake  He is generally avoiding drinks with sugar  Non-insulin hypoglycemic drugs the patient is taking are: Xigduo 06/998, 2 tablets daily and Victoza 1.8 mg daily  inam      Side effects from medications have been:  none Compliance with the medical regimen: fair  Glucose monitoring:  done irregularly        Glucometer: One touch Verio   Blood Glucose readings from monitor download as follows: He has only 4 readings this month  Mean values apply above for all meters except median for One Touch  PRE-MEAL Fasting Lunch Dinner Bedtime Overall  Glucose range: 110-182  113, 137   147    Mean/median:         Self-care: He has been consistent with diet  recently   Meals: 3 meals per day. Breakfast is protein shake or oatmeal, lunch is frequently salad or sandwich. Dinner usually sandwich         Exercise: None now Dietician visit, most recent: 10/15            Weight history: was 380 in 2014  Wt Readings from Last 3 Encounters:  12/01/15 (!) 327 lb (148.3 kg)  05/08/15 (!) 317 lb 3.2 oz (143.9 kg)  03/27/15 (!) 317 lb 9.6 oz (144.1 kg)    Glycemic control:   Lab Results  Component Value Date   HGBA1C 5.6 12/01/2015   HGBA1C 9.2 03/27/2015   HGBA1C 7.6 (H) 05/06/2014   Lab Results  Component Value Date   LDLCALC 85 05/08/2015   CREATININE 0.77 05/08/2015  Medication List       Accurate as of 12/01/15 12:50 PM. Always use your most recent med list.          atorvastatin 10 MG tablet Commonly known as:  LIPITOR Take 1 tablet (10 mg total) by mouth daily.   Canagliflozin-Metformin HCl [phone] MG Tabs Commonly known as:  INVOKAMET TAKE 2 TABLETS BY MOUTH DAILY( TO REPLACE XIGDUO)   Dapagliflozin-Metformin HCl ER 06-998 MG Tb24 Commonly known as:  XIGDUO XR Take 2 tablets by mouth daily.   glucose blood test strip Commonly known as:  ONETOUCH VERIO Use to test blood sugar 1 time a day as instructed.Dx code E11.65   liraglutide 18 MG/3ML Sopn Inject 0.3 mLs (1.8 mg total) into the skin daily.   ranitidine 150 MG tablet Commonly known as:  ZANTAC Take 150 mg by mouth 2 (two) times daily.       Allergies:  No Known Allergies  Past Medical History:  Diagnosis Date  . Diabetes mellitus without complication (HCC)   . GERD (gastroesophageal reflux disease)   . Obesity     Past Surgical History:  Procedure Laterality Date  . GROWTH PLATE SURGERY Bilateral 1980s  . WISDOM TOOTH EXTRACTION      Family History  Problem Relation Age of Onset  . Heart disease Mother   . Hypertension Mother   . Early death Neg Hx   . Alcohol abuse Neg Hx   . Birth defects Neg Hx   . Cancer Neg Hx   . COPD Neg Hx   . Drug abuse Neg Hx   . Hyperlipidemia Neg Hx   . Kidney disease Neg Hx   . Stroke Neg Hx   . Thyroid disease Neg Hx   . Diabetes Maternal Grandfather     Social History:  reports that he has never smoked. He has never used smokeless tobacco. He reports that he does not drink alcohol or use drugs.    Review of Systems     He is having nonspecific symptoms of bilateral lower abdominal discomfort, no significant burning, some frequency of urination and urine not looking clear      Most recent eye exam was 8/.17       Lipids: He has been started Lipitor 10 mg daily and needs follow-up levels.  Baseline LDL has been in the 140s      Lab Results  Component Value Date   CHOL 133 05/08/2015   HDL 30.20 (L) 05/08/2015   LDLCALC 85 05/08/2015   LDLDIRECT 141.3 01/12/2014   TRIG 85.0 05/08/2015   CHOLHDL 4 05/08/2015                  Thyroid:  No  unusual fatigue.   He was found to have left-sided thyroid nodule on exam and this was benign on biopsy On exam he has a 2.5-3 cm nodule on the left side  Lab Results  Component Value Date   TSH 0.76 03/27/2015       The blood pressure again appears to be    in the office today Not on any antihypertensive     LABS:  Office Visit on 12/01/2015  Component Date Value Ref Range Status  . Hemoglobin A1C 12/01/2015 5.6   Final    Physical Examination:  BP (!) 130/96 (Cuff Size: Large)   Pulse 94   Ht 6' (1.829 m)   Wt (!) 327  lb (148.3 kg)   SpO2 97%   BMI 44.35  kg/m   Left side thyroid nodule palpable, mostly on swallowing and is medial.  Firm and about 2.5-3 cm in size Right-sided not palpable    ASSESSMENT/PLAN:  Diabetes type 2, uncontrolled  with BMI 44  See history of present illness for detailed discussion of his current management, blood sugar patterns and problems identified His blood sugars are improving significantly with his being compliant with diet and medications However has not been exercising Usually has relatively higher fasting readings compared to nonfasting but has monitored very occasionally only Also not consistent at follow-up  Discussed blood sugar targets and need to check more consistently He will need to call if his blood sugars are not consistent control Does need to exercise even with brisk walking 4 times a week Since he is better compliant with taking Victoza first thing in the morning he can continue to do that and may take the Xigduo later in the day Apparently his insurance will not cover Victoza but he will need to find out what is covered   ?  HYPERTENSION: Blood pressure is reportedly fairly good when he checks on his own but higher today  Advised him to continue follow blood pressure readings at work  HYPERCHOLESTEROLEMIA: Needs follow-up Liver function abnormality: Needs follow-up  THYROID nodule/multinodular goiter:  Will follow this clinically as the nodule is benign  Counseling time on subjects discussed above is over 50% of today's 25 minute visit  Influenza vaccine given  Patient Instructions  Check blood sugars on waking up    Also check blood sugars about 2 hours after a meal and do this after different meals by rotation  Recommended blood sugar levels on waking up is 90-130 and about 2 hours after meal is 130-160  Please bring your blood sugar monitor to each visit, thank you  Restart exercise    [facility] 12/01/2015, 12:50 PM   Note:  This office note was prepared with Dragon voice recognition system technology. Any transcriptional errors that result from this process are unintentional.     ,

## 2015-12-01 NOTE — Patient Instructions (Signed)
Check blood sugars on waking up    Also check blood sugars about 2 hours after a meal and do this after different meals by rotation  Recommended blood sugar levels on waking up is 90-130 and about 2 hours after meal is 130-160  Please bring your blood sugar monitor to each visit, thank you  Restart exercise 

## 2015-12-14 ENCOUNTER — Other Ambulatory Visit: Payer: Self-pay

## 2015-12-14 ENCOUNTER — Telehealth: Payer: Self-pay | Admitting: Endocrinology

## 2015-12-14 MED ORDER — CANAGLIFLOZIN-METFORMIN HCL 150-1000 MG PO TABS: ORAL_TABLET | ORAL | 3 refills | Status: DC

## 2015-12-14 MED ORDER — LIRAGLUTIDE 18 MG/3ML ~~LOC~~ SOPN: 1.8000 mg | PEN_INJECTOR | Freq: Every day | SUBCUTANEOUS | 3 refills | Status: DC

## 2015-12-14 NOTE — Telephone Encounter (Signed)
Both have been ordered 

## 2015-12-14 NOTE — Telephone Encounter (Signed)
He also needs invokamet  He is out of the victoza now

## 2015-12-14 NOTE — Telephone Encounter (Signed)
Pt needs victoza pens called into [facility] on [address]

## 2015-12-21 ENCOUNTER — Telehealth: Payer: Self-pay | Admitting: Endocrinology

## 2015-12-21 NOTE — Telephone Encounter (Signed)
[facility] called and said that the Trulicity will not be covered, need to know what Dr. Lucianne MussKumar wants to do. She mentioned Byetta and Bydureon being covered.

## 2015-12-22 NOTE — Telephone Encounter (Signed)
[facility] stated Insurance will not cover be covered Victoza, alternative medications are Trulicity, Byetta, Bydureon.  Please advise

## 2015-12-22 NOTE — Telephone Encounter (Signed)
Replace Victoza with Trulicity 1.5 mg weekly, 4 pens with 1 refill

## 2015-12-25 ENCOUNTER — Other Ambulatory Visit: Payer: Self-pay

## 2015-12-25 MED ORDER — DULAGLUTIDE 1.5 MG/0.5ML ~~LOC~~ SOAJ: 1.5000 mg | SUBCUTANEOUS | 1 refills | Status: DC

## 2015-12-27 ENCOUNTER — Other Ambulatory Visit: Payer: Self-pay

## 2015-12-27 MED ORDER — CANAGLIFLOZIN-METFORMIN HCL 150-1000 MG PO TABS: ORAL_TABLET | ORAL | 3 refills | Status: DC

## 2015-12-27 NOTE — Telephone Encounter (Signed)
He is supposed to be on Xigduo which contains metformin, confirm what he is taking

## 2015-12-27 NOTE — Telephone Encounter (Signed)
Change Xigduo to Invokamet XR 150/1000, 2 tablets daily

## 2015-12-27 NOTE — Telephone Encounter (Signed)
Patient need refill of metformin    [id number] - [place], [place] - [address] [facility] [address] [phone] (Phone) [phone] (Fax)

## 2015-12-27 NOTE — Telephone Encounter (Signed)
Ordered 12/27/15 

## 2015-12-27 NOTE — Telephone Encounter (Signed)
Invokamet needs to be called in to replace the [facility]

## 2016-01-03 ENCOUNTER — Other Ambulatory Visit: Payer: Self-pay

## 2016-02-09 ENCOUNTER — Other Ambulatory Visit: Payer: Self-pay | Admitting: Endocrinology

## 2016-02-13 ENCOUNTER — Other Ambulatory Visit: Payer: Self-pay

## 2016-02-13 MED ORDER — GLUCOSE BLOOD VI STRP: ORAL_STRIP | 1 refills | Status: DC

## 2016-02-13 MED ORDER — ONETOUCH LANCETS MISC
1 refills | Status: DC
Start: 2016-02-13 — End: 2021-05-31

## 2016-03-01 ENCOUNTER — Ambulatory Visit: Payer: Managed Care, Other (non HMO) | Admitting: Endocrinology

## 2016-03-07 ENCOUNTER — Other Ambulatory Visit: Payer: Self-pay

## 2016-03-07 ENCOUNTER — Telehealth: Payer: Self-pay | Admitting: Endocrinology

## 2016-03-07 ENCOUNTER — Other Ambulatory Visit: Payer: Self-pay | Admitting: Endocrinology

## 2016-03-07 MED ORDER — CANAGLIFLOZIN-METFORMIN HCL 150-1000 MG PO TABS: ORAL_TABLET | ORAL | 3 refills | Status: DC

## 2016-03-07 NOTE — Telephone Encounter (Signed)
Ordered

## 2016-03-07 NOTE — Telephone Encounter (Signed)
Please call in the invokamet to [facility]

## 2016-03-08 ENCOUNTER — Other Ambulatory Visit: Payer: Self-pay

## 2016-03-11 ENCOUNTER — Ambulatory Visit: Payer: Managed Care, Other (non HMO) | Admitting: Endocrinology

## 2016-03-18 ENCOUNTER — Ambulatory Visit: Payer: Managed Care, Other (non HMO) | Admitting: Endocrinology

## 2016-03-19 ENCOUNTER — Ambulatory Visit: Payer: Managed Care, Other (non HMO) | Admitting: Endocrinology

## 2016-04-03 ENCOUNTER — Other Ambulatory Visit: Payer: Self-pay | Admitting: Endocrinology

## 2016-04-11 ENCOUNTER — Encounter: Payer: Self-pay | Admitting: Endocrinology

## 2016-04-11 ENCOUNTER — Ambulatory Visit (INDEPENDENT_AMBULATORY_CARE_PROVIDER_SITE_OTHER): Payer: Managed Care, Other (non HMO) | Admitting: Endocrinology

## 2016-04-11 VITALS — BP 146/90 | HR 89 | Ht 72.0 in | Wt 321.0 lb

## 2016-04-11 DIAGNOSIS — E041 Nontoxic single thyroid nodule: Secondary | ICD-10-CM

## 2016-04-11 DIAGNOSIS — E1165 Type 2 diabetes mellitus with hyperglycemia: Secondary | ICD-10-CM | POA: Diagnosis not present

## 2016-04-11 LAB — BASIC METABOLIC PANEL
BUN: 13 mg/dL (ref 6–23)
CO2: 28 mEq/L (ref 19–32)
Calcium: 10 mg/dL (ref 8.4–10.5)
Chloride: 103 mEq/L (ref 96–112)
Creatinine, Ser: 0.93 mg/dL (ref 0.40–1.50)
GFR: 113.6 mL/min (ref >60.00)
Glucose, Bld: 109 mg/dL — ABNORMAL HIGH (ref 70–99)
Potassium: 3.6 mEq/L (ref 3.5–5.1)
Sodium: 138 mEq/L (ref 135–145)

## 2016-04-11 LAB — POCT GLYCOSYLATED HEMOGLOBIN (HGB A1C): Hemoglobin A1C: 5.9

## 2016-04-11 MED ORDER — VALSARTAN 80 MG PO TABS: 80.0000 mg | ORAL_TABLET | Freq: Every day | ORAL | 2 refills | Status: DC

## 2016-04-11 NOTE — Progress Notes (Signed)
Patient ID: Tyler Benton, male   DOB: 1970-05-08, 46 y.o.   MRN: [id number]            Reason for Appointment: Follow-up for Type 2 Diabetes  Referring physician: Sanda Linger  History of Present Illness:          Diagnosis: Type 2 diabetes mellitus, date of diagnosis: 2014       Past history: He had routine labs in 2014 which showed A1c of 6.6 and glucose of 158 fasting This was felt to be borderline and he was not started on any medication He was given Belviq for weight loss but he did not continue this after 2 weeks since he did not help and he found it expensive He was referred for evaluation because of poor blood sugar control in 8/15 and his blood sugar was significantly high with fasting glucose of 266 and A1c of 9.2 This was despite his trying to lose weight and he had lost 30 pounds along with exercising regularly His PCP had started treatment of his diabetes with a combination treatment using Victoza and Xigduo  Recent history:   He was having poor control of his diabetes with A1c 9.2 in 2/17 because of noncompliance with on measures of self-care and medications and having symptomatic hyperglycemia  A1c is consistently good at 5.9, previously 5.6  However he still irregular with his follow-up  Current management, blood sugar patterns and problems identified:  He has checked his blood sugar sporadically and only once this month  Although he has previously had higher fasting readings he is checking mostly readings after his lunch at work or evening meal late at night  He has been working 2 jobs and some at night and is not finding the time to exercise  Has only lost 6 pounds since his last visit  Again having variable coverage of his medications with insurance and is now on Trulicity instead of Victoza, no side effects  He thinks he is generally trying to avoid high-fat meals and snacks  Non-insulin hypoglycemic drugs the patient is taking are: Xigduo 06/998, 2  tablets daily   and Victoza 1.8 mg daily inam      Side effects from medications have been:  none Compliance with the medical regimen: fair  Glucose monitoring:  done irregularly        Glucometer: One touch Verio   Blood Glucose readings from monitor download as follows:   He has only 9 readings this month  POSTPRANDIAL at night 120-210, other postprandial readings 114-164 Fasting 107 Overall median 132   Self-care: He has been consistent with diet  recently   Meals: 3 meals per day. Breakfast is protein shake or oatmeal, lunch is frequently salad or sandwich. Dinner usually sandwich         Exercise: Rarely  Dietician visit, most recent: 10/15            Weight history: was 380 in 2014  Wt Readings from Last 3 Encounters:  04/11/16 (!) 321 lb (145.6 kg)  12/01/15 (!) 327 lb (148.3 kg)  05/08/15 (!) 317 lb 3.2 oz (143.9 kg)    Glycemic control:   Lab Results  Component Value Date   HGBA1C 5.9 04/11/2016   HGBA1C 5.6 12/01/2015   HGBA1C 9.2 03/27/2015   Lab Results  Component Value Date   MICROALBUR <0.7 12/01/2015   LDLCALC 98 12/01/2015   CREATININE 0.93 04/11/2016    Other active problems: See review of systems  Allergies as of 04/11/2016   No Known Allergies     Medication List       Accurate as of 04/11/16 11:59 PM. Always use your most recent med list.          atorvastatin 10 MG tablet Commonly known as:  LIPITOR Take 1 tablet (10 mg total) by mouth daily.   Canagliflozin-Metformin HCl [phone] MG Tabs Commonly known as:  INVOKAMET TAKE 2 TABLETS BY MOUTH DAILY( TO REPLACE XIGDUO)   Dapagliflozin-Metformin HCl ER 06-998 MG Tb24 Commonly known as:  XIGDUO XR Take 2 tablets by mouth daily.   glucose blood test strip Commonly known as:  ONETOUCH VERIO Use to test blood sugar 1 time a day as instructed.Dx code E11.65   ONE TOUCH LANCETS Misc Use to test blood sugar once daily   ranitidine 150 MG tablet Commonly known as:  ZANTAC Take  150 mg by mouth 2 (two) times daily.   TRULICITY 1.5 MG/0.5ML Sopn Generic drug:  Dulaglutide INJECT 1 SYRINGE INTO THE SKIN ONCE A WEEK. REPLACE VICTOZA WITH TRULICITY   valsartan 80 MG tablet Commonly known as:  DIOVAN Take 1 tablet (80 mg total) by mouth daily.       Allergies: No Known Allergies  Past Medical History:  Diagnosis Date  . Diabetes mellitus without complication (HCC)   . GERD (gastroesophageal reflux disease)   . Obesity     Past Surgical History:  Procedure Laterality Date  . GROWTH PLATE SURGERY Bilateral 1980s  . WISDOM TOOTH EXTRACTION      Family History  Problem Relation Age of Onset  . Heart disease Mother   . Hypertension Mother   . Early death Neg Hx   . Alcohol abuse Neg Hx   . Birth defects Neg Hx   . Cancer Neg Hx   . COPD Neg Hx   . Drug abuse Neg Hx   . Hyperlipidemia Neg Hx   . Kidney disease Neg Hx   . Stroke Neg Hx   . Thyroid disease Neg Hx   . Diabetes Maternal Grandfather     Social History:  reports that he has never smoked. He has never used smokeless tobacco. He reports that he does not drink alcohol or use drugs.    Review of Systems         Most recent eye exam was 5/.17       Lipids: He has been Taking Lipitor 10 mg daily LDL below 100     Lab Results  Component Value Date   CHOL 158 12/01/2015   HDL 43.00 12/01/2015   LDLCALC 98 12/01/2015   LDLDIRECT 141.3 01/12/2014   TRIG 82.0 12/01/2015   CHOLHDL 4 12/01/2015                  Thyroid:  No  unusual fatigue.   He was found to have left-sided thyroid nodule on exam and this was benign on biopsy On exam he has a 2.5-3 cm nodule on the left side  Lab Results  Component Value Date   TSH 0.76 03/27/2015       The blood pressure again appears to be Consistently high He thinks blood pressure at work is about 140/84 recently  Not on any antihypertensive, has been reluctant to start treatment  BP Readings from Last 3 Encounters:  04/11/16 (!)  146/90  12/01/15 (!) 130/96  05/08/15 126/82    Previously liver functions were abnormal  Lab Results  Component Value Date  ALT 31 12/01/2015      LABS:  Office Visit on 04/11/2016  Component Date Value Ref Range Status  . Sodium 04/11/2016 138  135 - 145 mEq/L Final  . Potassium 04/11/2016 3.6  3.5 - 5.1 mEq/L Final  . Chloride 04/11/2016 103  96 - 112 mEq/L Final  . CO2 04/11/2016 28  19 - 32 mEq/L Final  . Glucose, Bld 04/11/2016 109* 70 - 99 mg/dL Final  . BUN 16/10/960403/09/2016 13  6 - 23 mg/dL Final  . Creatinine, Ser 04/11/2016 0.93  0.40 - 1.50 mg/dL Final  . Calcium 54/09/811903/09/2016 10.0  8.4 - 10.5 mg/dL Final  . GFR 14/78/295603/09/2016 113.60  >60.00 mL/min Final  . Hemoglobin A1C 04/11/2016 5.9   Final    Physical Examination:  BP (!) 146/90 (Cuff Size: Large)   Pulse 89   Ht 6' (1.829 m)   Wt (!) 321 lb (145.6 kg)   SpO2 96%   BMI 43.54 kg/m   Left side thyroid nodule palpable Medially.  Firm and about 2.5-3 cm in size Right-sided not palpable   Diabetic Foot Exam - Simple   Simple Foot Form Diabetic Foot exam was performed with the following findings:  Yes   Visual Inspection No deformities, no ulcerations, no other skin breakdown bilaterally:  Yes Sensation Testing Intact to touch and monofilament testing bilaterally:  Yes Pulse Check Posterior Tibialis and Dorsalis pulse intact bilaterally:  Yes Comments      ASSESSMENT/PLAN:  Diabetes type 2, uncontrolled  with BMI 44  See history of present illness for detailed discussion of his current management, blood sugar patterns and problems identified  A1c is again below 6% on the lower than expected for his blood sugars Has not monitored blood sugars very consistently but they are Usually appearing normal except when he runs out of his medications  However has not been exercising and not losing weight, discussed increasing efforts to lose weight with regular exercise and diet even if he has to walk in between  his breaks    HYPERTENSION: Blood pressure is persistently high in the office and even not at ideal levels when checked at work or otherwise Discussed that with his history of diabetes needs to be on an ACE inhibitor or ARB drug To enable better tolerability and this affection potassium will start him on DIOVAN 80 mg daily He will call his PCP for follow-up  HYPERCHOLESTEROLEMIA: Needs follow-up on the next visit   THYROID nodule/multinodular goiter:  By exam the nodule feels about the same  Transiently higher calcium on the last visit, will repeat  There are no Patient Instructions on file for this visit.  Counseling time on subjects discussed above is over 50% of today's 25 minute visit  Tyler Benton 04/13/2016, 5:16 PM   Note: This office note was prepared with Insurance underwriterDragon voice recognition system technology. Any transcriptional errors that result from this process are unintentional.   Addendum: Labs normal calcium and electrolytes  Office Visit on 04/11/2016  Component Date Value Ref Range Status  . Sodium 04/11/2016 138  135 - 145 mEq/L Final  . Potassium 04/11/2016 3.6  3.5 - 5.1 mEq/L Final  . Chloride 04/11/2016 103  96 - 112 mEq/L Final  . CO2 04/11/2016 28  19 - 32 mEq/L Final  . Glucose, Bld 04/11/2016 109* 70 - 99 mg/dL Final  . BUN 21/30/865703/09/2016 13  6 - 23 mg/dL Final  . Creatinine, Ser 04/11/2016 0.93  0.40 - 1.50 mg/dL Final  .  Calcium 04/11/2016 10.0  8.4 - 10.5 mg/dL Final  . GFR 16/11/9602 113.60  >60.00 mL/min Final  . Hemoglobin A1C 04/11/2016 5.9   Final     ,

## 2016-04-12 ENCOUNTER — Other Ambulatory Visit: Payer: Self-pay

## 2016-04-12 MED ORDER — DULAGLUTIDE 1.5 MG/0.5ML ~~LOC~~ SOAJ: SUBCUTANEOUS | 4 refills | Status: DC

## 2016-08-14 ENCOUNTER — Ambulatory Visit: Payer: Managed Care, Other (non HMO) | Admitting: Endocrinology

## 2016-09-01 ENCOUNTER — Other Ambulatory Visit: Payer: Self-pay | Admitting: Endocrinology

## 2016-09-01 DIAGNOSIS — IMO0001 Reserved for inherently not codable concepts without codable children: Secondary | ICD-10-CM

## 2016-09-01 DIAGNOSIS — E1165 Type 2 diabetes mellitus with hyperglycemia: Principal | ICD-10-CM

## 2016-10-11 ENCOUNTER — Other Ambulatory Visit: Payer: Self-pay | Admitting: Endocrinology

## 2016-11-02 ENCOUNTER — Other Ambulatory Visit: Payer: Self-pay | Admitting: Endocrinology

## 2016-11-02 DIAGNOSIS — IMO0001 Reserved for inherently not codable concepts without codable children: Secondary | ICD-10-CM

## 2016-11-02 DIAGNOSIS — E1165 Type 2 diabetes mellitus with hyperglycemia: Principal | ICD-10-CM

## 2016-11-04 ENCOUNTER — Telehealth: Payer: Self-pay | Admitting: Endocrinology

## 2016-11-04 ENCOUNTER — Other Ambulatory Visit: Payer: Self-pay

## 2016-11-04 DIAGNOSIS — E1165 Type 2 diabetes mellitus with hyperglycemia: Principal | ICD-10-CM

## 2016-11-04 DIAGNOSIS — IMO0001 Reserved for inherently not codable concepts without codable children: Secondary | ICD-10-CM

## 2016-11-04 MED ORDER — DULAGLUTIDE 1.5 MG/0.5ML ~~LOC~~ SOAJ: SUBCUTANEOUS | 0 refills | Status: DC

## 2016-11-04 MED ORDER — DAPAGLIFLOZIN PRO-METFORMIN ER 5-1000 MG PO TB24: 2.0000 | ORAL_TABLET | Freq: Every day | ORAL | 0 refills | Status: DC

## 2016-11-04 NOTE — Telephone Encounter (Signed)
Called patient and left a voice message to let him know that I have sent in a refill to his pharmacy for him.

## 2016-11-04 NOTE — Telephone Encounter (Signed)
MEDICATION:   TRULICITY 1.5 MG/0.5ML SOPN    XIGDUO XR 06-998 MG TB24      PHARMACY:   [id number] - [place], [place] - [address]) [phone] (Phone) [phone] (Fax)     IS THIS A 90 DAY SUPPLY : y  IS PATIENT OUT OF MEDICATION: y  IF NOT; HOW MUCH IS LEFT:   LAST APPOINTMENT DATE: /08/18 NEXT APPOINTMENT DATE:@11 /08/2016  OTHER COMMENTS:    **Let patient know to contact pharmacy at the end of the day to make sure medication is ready. **  ** Please notify patient to allow 48-72 hours to process**  **Encourage patient to contact the pharmacy for refills or they can request refills through [facility]**

## 2016-11-11 NOTE — Telephone Encounter (Signed)
Patient stated he need a PA for medication trulicity, need enough medication until appt time, please advise

## 2016-11-14 NOTE — Telephone Encounter (Signed)
Can you please start this PA if you have not already done so? Thank you!

## 2016-11-14 NOTE — Telephone Encounter (Signed)
This was faxed today 

## 2016-11-20 ENCOUNTER — Other Ambulatory Visit (INDEPENDENT_AMBULATORY_CARE_PROVIDER_SITE_OTHER): Payer: BLUE CROSS/BLUE SHIELD

## 2016-11-20 ENCOUNTER — Encounter: Payer: Self-pay | Admitting: Internal Medicine

## 2016-11-20 ENCOUNTER — Ambulatory Visit (INDEPENDENT_AMBULATORY_CARE_PROVIDER_SITE_OTHER)
Admission: RE | Admit: 2016-11-20 | Discharge: 2016-11-20 | Disposition: A | Discharge status: Home / Self Care | Payer: BLUE CROSS/BLUE SHIELD | Source: Ambulatory Visit | Attending: Internal Medicine | Admitting: Internal Medicine

## 2016-11-20 ENCOUNTER — Ambulatory Visit (INDEPENDENT_AMBULATORY_CARE_PROVIDER_SITE_OTHER): Payer: BLUE CROSS/BLUE SHIELD | Admitting: Internal Medicine

## 2016-11-20 VITALS — BP 150/88 | HR 83 | Temp 99.7°F | Ht 72.0 in | Wt 337.0 lb

## 2016-11-20 DIAGNOSIS — R109 Unspecified abdominal pain: Secondary | ICD-10-CM

## 2016-11-20 DIAGNOSIS — Z23 Encounter for immunization: Secondary | ICD-10-CM | POA: Diagnosis not present

## 2016-11-20 DIAGNOSIS — E1165 Type 2 diabetes mellitus with hyperglycemia: Secondary | ICD-10-CM | POA: Diagnosis not present

## 2016-11-20 DIAGNOSIS — G8929 Other chronic pain: Secondary | ICD-10-CM | POA: Diagnosis not present

## 2016-11-20 LAB — POC URINALSYSI DIPSTICK (AUTOMATED)
Bilirubin, UA: NEGATIVE
Blood, UA: NEGATIVE
Glucose, UA: POSITIVE
Ketones, UA: POSITIVE
Leukocytes, UA: NEGATIVE
Nitrite, UA: NEGATIVE
Protein, UA: NEGATIVE
Spec Grav, UA: 1.02 (ref 1.010–1.025)
Urobilinogen, UA: 0.2 E.U./dL
pH, UA: 6 (ref 5.0–8.0)

## 2016-11-20 LAB — POCT UA - MICROALBUMIN
Albumin/Creatinine Ratio, Urine, POC: <30
Creatinine, POC: 50 mg/dL
Microalbumin Ur, POC: 10 mg/L

## 2016-11-20 NOTE — Progress Notes (Signed)
Subjective:  Patient ID: Tyler Benton, male    DOB: 1970-09-14  Age: 46 y.o. MRN: [id number]  CC: Flank Pain   HPI Tyler Benton presents for a one-month history of intermittent left flank pain.  He notices this discomfort mostly when he is in the bed and trying to move around.  For the most part it is a dull ache but there have been a few sharp pains with movement.  He denies nausea, vomiting, fever, chills, abdominal pain, dysuria, or hematuria.  Outpatient Medications Prior to Visit  Medication Sig Dispense Refill  . atorvastatin (LIPITOR) 10 MG tablet Take 1 tablet (10 mg total) by mouth daily. 30 tablet 3  . Dapagliflozin-Metformin HCl ER (XIGDUO XR) 06-998 MG TB24 Take 2 tablets by mouth daily. 60 tablet 0  . Dulaglutide (TRULICITY) 1.5 MG/0.5ML SOPN INJECT 1 SYRINGE INTO THE SKIN ONCE WEEKLY. REPLACING VICTOZA 2 mL 0  . glucose blood (ONETOUCH VERIO) test strip Use to test blood sugar 1 time a day as instructed.Dx code E11.65 100 each 1  . ONE TOUCH LANCETS MISC Use to test blood sugar once daily 200 each 1  . ranitidine (ZANTAC) 150 MG tablet Take 150 mg by mouth 2 (two) times daily.    . valsartan (DIOVAN) 80 MG tablet Take 1 tablet (80 mg total) by mouth daily. 30 tablet 2  . Canagliflozin-Metformin HCl (INVOKAMET) [id number] MG TABS TAKE 2 TABLETS BY MOUTH DAILY( TO REPLACE XIGDUO) 60 tablet 3   No facility-administered medications prior to visit.     ROS Review of Systems  Constitutional: Negative.  Negative for chills, fatigue and fever.  HENT: Negative.  Negative for sinus pressure and trouble swallowing.   Eyes: Negative.  Negative for visual disturbance.  Respiratory: Negative.  Negative for choking, shortness of breath and wheezing.   Cardiovascular: Negative for chest pain, palpitations and leg swelling.  Gastrointestinal: Negative for abdominal pain, constipation, diarrhea, nausea and vomiting.  Endocrine: Negative.   Genitourinary: Positive for flank pain.  Negative for difficulty urinating, dysuria, hematuria, penile swelling, scrotal swelling, testicular pain and urgency.  Musculoskeletal: Negative for back pain and myalgias.  Skin: Negative.  Negative for color change and rash.  Allergic/Immunologic: Negative.   Neurological: Negative.  Negative for dizziness and weakness.  Hematological: Negative for adenopathy. Does not bruise/bleed easily.  Psychiatric/Behavioral: Negative.     Objective:  BP (!) 150/88 (BP Location: Left Arm, Patient Position: Sitting, Cuff Size: Large)   Pulse 83   Temp 99.7 F (37.6 C) (Oral)   Ht 6' (1.829 m)   Wt (!) 337 lb (152.9 kg)   SpO2 96%   BMI 45.71 kg/m   BP Readings from Last 3 Encounters:  11/20/16 (!) 150/88  04/11/16 (!) 146/90  12/01/15 (!) 130/96    Wt Readings from Last 3 Encounters:  11/20/16 (!) 337 lb (152.9 kg)  04/11/16 (!) 321 lb (145.6 kg)  12/01/15 (!) 327 lb (148.3 kg)    Physical Exam  Constitutional: He is oriented to person, place, and time. No distress.  HENT:  Mouth/Throat: Oropharynx is clear and moist. No oropharyngeal exudate.  Eyes: Conjunctivae are normal. Right eye exhibits no discharge. Left eye exhibits no discharge. No scleral icterus.  Neck: Normal range of motion. Neck supple. No JVD present. No thyromegaly present.  Cardiovascular: Normal rate, regular rhythm and intact distal pulses.  Exam reveals no gallop and no friction rub.   No murmur heard. Pulmonary/Chest: Effort normal and breath sounds normal. No  respiratory distress. He has no wheezes. He has no rales. He exhibits no tenderness.  Abdominal: Soft. Normal appearance and bowel sounds are normal. He exhibits no distension and no mass. There is no hepatosplenomegaly, splenomegaly or hepatomegaly. There is no tenderness. There is no rigidity, no rebound, no guarding and no CVA tenderness.  Musculoskeletal: Normal range of motion. He exhibits no edema, tenderness or deformity.  Lymphadenopathy:    He  has no cervical adenopathy.  Neurological: He is alert and oriented to person, place, and time.  Skin: Skin is warm and dry. No rash noted. He is not diaphoretic. No erythema. No pallor.  Vitals reviewed.   Lab Results  Component Value Date   WBC 5.3 11/20/2016   HGB 15.2 11/20/2016   HCT 46.0 11/20/2016   PLT 268.0 11/20/2016   GLUCOSE 270 (H) 11/20/2016   CHOL 158 12/01/2015   TRIG 82.0 12/01/2015   HDL 43.00 12/01/2015   LDLDIRECT 141.3 01/12/2014   LDLCALC 98 12/01/2015   ALT 32 11/20/2016   AST 23 11/20/2016   NA 141 11/20/2016   K 3.9 11/20/2016   CL 103 11/20/2016   CREATININE 1.08 11/20/2016   BUN 11 11/20/2016   CO2 28 11/20/2016   TSH 0.76 03/27/2015   HGBA1C 6.3 11/20/2016   MICROALBUR 10 11/20/2016    US Thyroid Biopsy  Result Date: 04/25/2015 INDICATION: Dominant left thyroid nodule. EXAM: ULTRASOUND GUIDED NEEDLE ASPIRATE BIOPSY OF THE THYROID GLAND MEDICATIONS: None. ANESTHESIA/SEDATION: None FLUOROSCOPY TIME:  None COMPLICATIONS: None immediate. PROCEDURE: Informed written consent was obtained from the patient after a thorough discussion of the procedural risks, benefits and alternatives. All questions were addressed. A timeout was performed prior to the initiation of the procedure. Ultrasound was performed to localize and mark an adequate site for the biopsy. The patient was then prepped and draped in a normal sterile fashion. Local anesthesia was provided with 1% lidocaine. Using direct ultrasound guidance, 4 passes were made using needles into the nodule within the left lobe of the thyroid. Ultrasound was used to confirm needle placements on all occasions. Specimens were sent to Pathology for analysis. FINDINGS: Heterogeneous nodule in the inferior left thyroid lobe. IMPRESSION: Ultrasound-guided fine needle aspiration of the dominant left thyroid nodule. Electronically Signed   By: Richarda Overlie M.D.   On: 04/25/2015 17:07   Dg Abd Acute W/chest  Result Date:  11/20/2016 CLINICAL DATA:  Left-sided flank pain for 1 month EXAM: DG ABDOMEN ACUTE W/ 1V CHEST COMPARISON:  None. FINDINGS: Cardiac shadow is within normal limits. The lungs are clear bilaterally. No acute bony abnormality is seen. Scattered large and small bowel gas is noted. Fecal material is noted within the colon consistent with a mild degree of constipation. No definitive renal or ureteral stones are seen. Postsurgical changes in the left hip are noted. Degenerative change of the lumbar spine is seen. IMPRESSION: Mild constipation.  No other focal abnormality is noted. Electronically Signed   By: Alcide Clever M.D.   On: 11/20/2016 20:57    Assessment & Plan:   Lisandro was seen today for flank pain.  Diagnoses and all orders for this visit:  Acute left flank pain -     POCT Urinalysis Dipstick (Automated)  Uncontrolled type 2 diabetes mellitus with hyperglycemia (HCC)-his A1c is at 6.3%.  His blood sugar is very well controlled. -     POCT UA - Microalbumin -     Comprehensive metabolic panel; Future -     CBC with  Differential/Platelet; Future -     Hemoglobin A1c; Future  Left flank pain, chronic-his exam is unremarkable.  His labs are normal.  Plain films show only mild constipation.  I do not think the constipation is causing his symptoms.  I do think he has musculoskeletal flank pain and have asked him to try over-the-counter remedies such as Tylenol and ibuprofen. -     Comprehensive metabolic panel; Future -     CBC with Differential/Platelet; Future -     DG Abd Acute W/Chest; Future  Need for influenza vaccination -     Flu Vaccine QUAD 36+ mos IM   I have discontinued Mr. Signer's Canagliflozin-Metformin HCl. I am also having him maintain his ranitidine, atorvastatin, glucose blood, ONE TOUCH LANCETS, valsartan, Dulaglutide, and Dapagliflozin-Metformin HCl ER.  No orders of the defined types were placed in this encounter.    Follow-up: Return in about 3 weeks (around  12/11/2016).  Sanda Linger, MD

## 2016-11-20 NOTE — Patient Instructions (Signed)
Flank Pain, Adult Flank pain is pain that is located on the side of the body between the upper abdomen and the back. This area is called the flank. The pain may occur over a short period of time (acute), or it may be long-term or recurring (chronic). It may be mild or severe. Flank pain can be caused by many things, including:  Muscle soreness or injury.  Kidney stones or kidney disease.  Stress.  A disease of the spine (vertebral disk disease).  A lung infection (pneumonia).  Fluid around the lungs (pulmonary edema).  A skin rash caused by the chickenpox virus (shingles).  Tumors that affect the back of the abdomen.  Gallbladder disease.  Follow these instructions at home:  Drink enough fluid to keep your urine clear or pale yellow.  Rest as told by your health care provider.  Take over-the-counter and prescription medicines only as told by your health care provider.  Keep a journal to track what has caused your flank pain and what has made it feel better.  Keep all follow-up visits as told by your health care provider. This is important. Contact a health care provider if:  Your pain is not controlled with medicine.  You have new symptoms.  Your pain gets worse.  You have a fever.  Your symptoms last longer than 2-3 days.  You have trouble urinating or you are urinating very frequently. Get help right away if:  You have trouble breathing or you are short of breath.  Your abdomen hurts or it is swollen or red.  You have nausea or vomiting.  You feel faint or you pass out.  You have blood in your urine. Summary  Flank pain is pain that is located on the side of the body between the upper abdomen and the back.  The pain may occur over a short period of time (acute), or it may be long-term or recurring (chronic). It may be mild or severe.  Flank pain can be caused by many things.  Contact your health care provider if your symptoms get worse or they last  longer than 2-3 days. This information is not intended to replace advice given to you by your health care provider. Make sure you discuss any questions you have with your health care provider. Document Released: 03/14/2005 Document Revised: 04/05/2016 Document Reviewed: 04/05/2016 Elsevier Interactive Patient Education  2018 [employer]  

## 2016-11-21 ENCOUNTER — Encounter: Payer: Self-pay | Admitting: Internal Medicine

## 2016-11-21 LAB — CBC WITH DIFFERENTIAL/PLATELET
Basophils Absolute: 0.1 10*3/uL (ref 0.0–0.1)
Basophils Relative: 1 % (ref 0.0–3.0)
Eosinophils Absolute: 0.3 10*3/uL (ref 0.0–0.7)
Eosinophils Relative: 5.3 % — ABNORMAL HIGH (ref 0.0–5.0)
HCT: 46 % (ref 39.0–52.0)
Hemoglobin: 15.2 g/dL (ref 13.0–17.0)
Lymphocytes Relative: 26.4 % (ref 12.0–46.0)
Lymphs Abs: 1.4 10*3/uL (ref 0.7–4.0)
MCHC: 33.1 g/dL (ref 30.0–36.0)
MCV: 97.9 fl (ref 78.0–100.0)
Monocytes Absolute: 0.8 10*3/uL (ref 0.1–1.0)
Monocytes Relative: 14.7 % — ABNORMAL HIGH (ref 3.0–12.0)
Neutro Abs: 2.8 10*3/uL (ref 1.4–7.7)
Neutrophils Relative %: 52.6 % (ref 43.0–77.0)
Platelets: 268 10*3/uL (ref 150.0–400.0)
RBC: 4.69 Mil/uL (ref 4.22–5.81)
RDW: 14.6 % (ref 11.5–15.5)
WBC: 5.3 10*3/uL (ref 4.0–10.5)

## 2016-11-21 LAB — COMPREHENSIVE METABOLIC PANEL
ALT: 32 U/L (ref 0–53)
AST: 23 U/L (ref 0–37)
Albumin: 4 g/dL (ref 3.5–5.2)
Alkaline Phosphatase: 56 U/L (ref 39–117)
BUN: 11 mg/dL (ref 6–23)
CO2: 28 mEq/L (ref 19–32)
Calcium: 10 mg/dL (ref 8.4–10.5)
Chloride: 103 mEq/L (ref 96–112)
Creatinine, Ser: 1.08 mg/dL (ref 0.40–1.50)
GFR: 95.33 mL/min (ref >60.00)
Glucose, Bld: 270 mg/dL — ABNORMAL HIGH (ref 70–99)
Potassium: 3.9 mEq/L (ref 3.5–5.1)
Sodium: 141 mEq/L (ref 135–145)
Total Bilirubin: 0.7 mg/dL (ref 0.2–1.2)
Total Protein: 7.1 g/dL (ref 6.0–8.3)

## 2016-11-21 LAB — HEMOGLOBIN A1C: Hgb A1c MFr Bld: 6.3 % (ref 4.6–6.5)

## 2016-11-27 MED ORDER — DULAGLUTIDE 1.5 MG/0.5ML ~~LOC~~ SOAJ: SUBCUTANEOUS | 4 refills | Status: DC

## 2016-11-27 NOTE — Telephone Encounter (Signed)
Trulicity has been approved for 11/16/16 to 10/13/21new prescription has been sent

## 2016-11-28 ENCOUNTER — Encounter: Payer: Managed Care, Other (non HMO) | Admitting: Internal Medicine

## 2016-12-04 ENCOUNTER — Encounter: Payer: Managed Care, Other (non HMO) | Admitting: Internal Medicine

## 2016-12-11 ENCOUNTER — Other Ambulatory Visit: Payer: Self-pay

## 2016-12-11 ENCOUNTER — Ambulatory Visit: Payer: Managed Care, Other (non HMO) | Admitting: Endocrinology

## 2016-12-11 ENCOUNTER — Telehealth: Payer: Self-pay | Admitting: *Deleted

## 2016-12-11 DIAGNOSIS — E1165 Type 2 diabetes mellitus with hyperglycemia: Principal | ICD-10-CM

## 2016-12-11 DIAGNOSIS — IMO0001 Reserved for inherently not codable concepts without codable children: Secondary | ICD-10-CM

## 2016-12-11 MED ORDER — DULAGLUTIDE 1.5 MG/0.5ML ~~LOC~~ SOAJ: SUBCUTANEOUS | 4 refills | Status: DC

## 2016-12-11 MED ORDER — DAPAGLIFLOZIN PRO-METFORMIN ER 5-1000 MG PO TB24: 2.0000 | ORAL_TABLET | Freq: Every day | ORAL | 0 refills | Status: DC

## 2016-12-11 NOTE — Telephone Encounter (Signed)
Called patient and let him know that I have sent in these prescriptions to his pharmacy.

## 2016-12-11 NOTE — Telephone Encounter (Signed)
Patient needs a refill of his Trulicity and Xigduo. Patient pharmacy is [facility] [address] . Please advise. Thank you

## 2017-01-01 ENCOUNTER — Encounter: Payer: BLUE CROSS/BLUE SHIELD | Admitting: Internal Medicine

## 2017-01-01 ENCOUNTER — Ambulatory Visit: Payer: BLUE CROSS/BLUE SHIELD | Admitting: Endocrinology

## 2017-01-08 ENCOUNTER — Other Ambulatory Visit (INDEPENDENT_AMBULATORY_CARE_PROVIDER_SITE_OTHER): Payer: BLUE CROSS/BLUE SHIELD

## 2017-01-08 ENCOUNTER — Ambulatory Visit (INDEPENDENT_AMBULATORY_CARE_PROVIDER_SITE_OTHER): Payer: BLUE CROSS/BLUE SHIELD | Admitting: Endocrinology

## 2017-01-08 ENCOUNTER — Encounter: Payer: Self-pay | Admitting: Internal Medicine

## 2017-01-08 ENCOUNTER — Ambulatory Visit (INDEPENDENT_AMBULATORY_CARE_PROVIDER_SITE_OTHER): Payer: BLUE CROSS/BLUE SHIELD | Admitting: Internal Medicine

## 2017-01-08 ENCOUNTER — Encounter: Payer: Self-pay | Admitting: Endocrinology

## 2017-01-08 VITALS — BP 134/90 | HR 89 | Ht 72.0 in | Wt 332.0 lb

## 2017-01-08 VITALS — BP 136/84 | HR 84 | Temp 98.1°F | Ht 72.0 in | Wt 332.0 lb

## 2017-01-08 DIAGNOSIS — E118 Type 2 diabetes mellitus with unspecified complications: Secondary | ICD-10-CM

## 2017-01-08 DIAGNOSIS — M25511 Pain in right shoulder: Secondary | ICD-10-CM

## 2017-01-08 DIAGNOSIS — M51369 Other intervertebral disc degeneration, lumbar region without mention of lumbar back pain or lower extremity pain: Secondary | ICD-10-CM | POA: Insufficient documentation

## 2017-01-08 DIAGNOSIS — E1165 Type 2 diabetes mellitus with hyperglycemia: Secondary | ICD-10-CM | POA: Diagnosis not present

## 2017-01-08 DIAGNOSIS — M5136 Other intervertebral disc degeneration, lumbar region: Secondary | ICD-10-CM | POA: Diagnosis not present

## 2017-01-08 DIAGNOSIS — Z0000 Encounter for general adult medical examination without abnormal findings: Secondary | ICD-10-CM

## 2017-01-08 DIAGNOSIS — G8929 Other chronic pain: Secondary | ICD-10-CM | POA: Insufficient documentation

## 2017-01-08 DIAGNOSIS — E785 Hyperlipidemia, unspecified: Secondary | ICD-10-CM | POA: Diagnosis not present

## 2017-01-08 DIAGNOSIS — Z0001 Encounter for general adult medical examination with abnormal findings: Secondary | ICD-10-CM | POA: Diagnosis not present

## 2017-01-08 DIAGNOSIS — I10 Essential (primary) hypertension: Secondary | ICD-10-CM

## 2017-01-08 DIAGNOSIS — Z23 Encounter for immunization: Secondary | ICD-10-CM

## 2017-01-08 LAB — LIPID PANEL
Cholesterol: 190 mg/dL (ref 0–200)
HDL: 40.6 mg/dL (ref >39.00)
LDL Cholesterol: 125 mg/dL — ABNORMAL HIGH (ref 0–99)
NonHDL: 149.34
Total CHOL/HDL Ratio: 5
Triglycerides: 121 mg/dL (ref 0.0–149.0)
VLDL: 24.2 mg/dL (ref 0.0–40.0)

## 2017-01-08 LAB — PSA: PSA: 0.39 ng/mL (ref 0.10–4.00)

## 2017-01-08 MED ORDER — TELMISARTAN 40 MG PO TABS: 40.0000 mg | ORAL_TABLET | Freq: Every day | ORAL | 1 refills | Status: DC

## 2017-01-08 MED ORDER — ATORVASTATIN CALCIUM 20 MG PO TABS: 20.0000 mg | ORAL_TABLET | Freq: Every day | ORAL | 1 refills | Status: DC

## 2017-01-08 MED ORDER — SEMAGLUTIDE(0.25 OR 0.5MG/DOS) 2 MG/1.5ML ~~LOC~~ SOPN: 0.5000 mg | PEN_INJECTOR | SUBCUTANEOUS | 2 refills | Status: DC

## 2017-01-08 NOTE — Patient Instructions (Signed)
Check blood sugars on waking up  2/3  Also check blood sugars about 2 hours after a meal and do this after different meals by rotation  Recommended blood sugar levels on waking up is 90-130 and about 2 hours after meal is 130-160  Please bring your blood sugar monitor to each visit, thank you

## 2017-01-08 NOTE — Patient Instructions (Signed)

## 2017-01-08 NOTE — Progress Notes (Signed)
Patient ID: Tyler Benton, male   DOB: 01/04/1971, 46 y.o.   MRN: [id number]            Reason for Appointment: Follow-up for Type 2 Diabetes  Referring physician: Sanda Linger  History of Present Illness:          Diagnosis: Type 2 diabetes mellitus, date of diagnosis: 2014       Past history: He had routine labs in 2014 which showed A1c of 6.6 and glucose of 158 fasting This was felt to be borderline and he was not started on any medication He was given Belviq for weight loss but he did not continue this after 2 weeks since he did not help and he found it expensive He was referred for evaluation because of poor blood sugar control in 8/15 and his blood sugar was significantly high with fasting glucose of 266 and A1c of 9.2 This was despite his trying to lose weight and he had lost 30 pounds along with exercising regularly His PCP had started treatment of his diabetes with a combination treatment using Victoza and Xigduo He was having poor control of his diabetes with A1c 9.2 in 2/17 because of noncompliance with on measures of self-care and medications and having symptomatic hyperglycemia  Recent history:  Non-insulin hypoglycemic drugs the patient is taking are: Xigduo 06/998, 2 tablets daily, Trulicity 1.5 mg weekly     He has not been seen in follow-up since 04/2016  A1c last done in 11/2016 was 6.3, previously 5.9  Current management, blood sugar patterns and problems identified:  He has checked his blood sugar sporadically again and will only twice recently, blood sugars midmorning 158 and lunchtime 128  However his lab glucose was 270 last month after eating cinnamon rolls at work  He has a gained significant amount of weight since his last visit  Previously had been on Victoza and this had been changed earlier this year to Trulicity, he thinks he is taking this regularly every week  For various reasons has not been exercising again but is planning to join a gym  He  is not considering seeing the dietitian because he thinks he knows what to do any generally is trying to limit carbohydrates and fats  No side effects with Xigduo       Side effects from medications have been:  none Compliance with the medical regimen: fair  Glucose monitoring:  done irregularly        Glucometer: One touch Verio   Blood Glucose readings from monitor download as follows:   He has only 2 readings in a month as above   Self-care: He has been consistent with diet usually   Meals: 3 meals per day. Breakfast is protein shake or oatmeal, lunch is frequently salad or sandwich. Dinner usually sandwich         Exercise: none  Dietician visit, most recent: 10/15            Weight history: was 380 in 2014  Wt Readings from Last 3 Encounters:  01/08/17 (!) 332 lb (150.6 kg)  01/08/17 (!) 332 lb (150.6 kg)  11/20/16 (!) 337 lb (152.9 kg)    Glycemic control:   Lab Results  Component Value Date   HGBA1C 6.3 11/20/2016   HGBA1C 5.9 04/11/2016   HGBA1C 5.6 12/01/2015   Lab Results  Component Value Date   MICROALBUR 10 11/20/2016   LDLCALC 125 (H) 01/08/2017   CREATININE 1.08 11/20/2016    Other  active problems: See review of systems   Allergies as of 01/08/2017   No Known Allergies     Medication List        Accurate as of 01/08/17  8:36 PM. Always use your most recent med list.          atorvastatin 20 MG tablet Commonly known as:  LIPITOR Take 1 tablet (20 mg total) by mouth daily.   Dapagliflozin-Metformin HCl ER 06-998 MG Tb24 Commonly known as:  XIGDUO XR Take 2 tablets daily by mouth.   glucose blood test strip Commonly known as:  ONETOUCH VERIO Use to test blood sugar 1 time a day as instructed.Dx code E11.65   ONE TOUCH LANCETS Misc Use to test blood sugar once daily   ranitidine 150 MG tablet Commonly known as:  ZANTAC Take 150 mg by mouth 2 (two) times daily.   Semaglutide 0.25 or 0.5 MG/DOSE Sopn Commonly known as:   OZEMPIC Inject 0.5 mg into the skin once a week.   telmisartan 40 MG tablet Commonly known as:  MICARDIS Take 1 tablet (40 mg total) by mouth daily.       Allergies: No Known Allergies  Past Medical History:  Diagnosis Date  . Diabetes mellitus without complication (HCC)   . GERD (gastroesophageal reflux disease)   . Obesity     Past Surgical History:  Procedure Laterality Date  . GROWTH PLATE SURGERY Bilateral 1980s  . WISDOM TOOTH EXTRACTION      Family History  Problem Relation Age of Onset  . Heart disease Mother   . Hypertension Mother   . Early death Neg Hx   . Alcohol abuse Neg Hx   . Birth defects Neg Hx   . Cancer Neg Hx   . COPD Neg Hx   . Drug abuse Neg Hx   . Hyperlipidemia Neg Hx   . Kidney disease Neg Hx   . Stroke Neg Hx   . Thyroid disease Neg Hx   . Diabetes Maternal Grandfather     Social History:  reports that  has never smoked. he has never used smokeless tobacco. He reports that he does not drink alcohol or use drugs.    Review of Systems         Most recent eye exam was 5/.17       Lipids: He has been Taking Lipitor 10 mg daily LDL below 100     Lab Results  Component Value Date   CHOL 190 01/08/2017   HDL 40.60 01/08/2017   LDLCALC 125 (H) 01/08/2017   LDLDIRECT 141.3 01/12/2014   TRIG 121.0 01/08/2017   CHOLHDL 5 01/08/2017                  Thyroid:  No  unusual fatigue.   He was found to have left-sided thyroid nodule on exam and this was benign on biopsy On exam he has a 2.5-3 cm nodule on the left side  Lab Results  Component Value Date   TSH 0.76 03/27/2015       The blood pressure again appears to be Consistently high He thinks blood pressure at work is about 130/78-88 recently   BP Readings from Last 3 Encounters:  01/08/17 134/90  01/08/17 136/84  11/20/16 (!) 150/88    Previously liver functions were abnormal  Lab Results  Component Value Date   ALT 32 11/20/2016      LABS:  Appointment on  01/08/2017  Component Date Value Ref Range Status  .  PSA 01/08/2017 0.39  0.10 - 4.00 ng/mL Final   Test performed using Access Hybritech PSA Assay, a parmagnetic partical, chemiluminecent immunoassay.  . Cholesterol 01/08/2017 190  0 - 200 mg/dL Final   ATP III Classification       Desirable:  < 200 mg/dL               Borderline High:  200 - 239 mg/dL          High:  > = 161 mg/dL  . Triglycerides 01/08/2017 121.0  0.0 - 149.0 mg/dL Final   Normal:  <096 mg/dLBorderline High:  150 - 199 mg/dL  . HDL 01/08/2017 40.60  >39.00 mg/dL Final  . VLDL 04/54/0981 24.2  0.0 - 40.0 mg/dL Final  . LDL Cholesterol 01/08/2017 125* 0 - 99 mg/dL Final  . Total CHOL/HDL Ratio 01/08/2017 5   Final                  Men          Women1/2 Average Risk     3.4          3.3Average Risk          5.0          4.42X Average Risk          9.6          7.13X Average Risk          15.0          11.0                      . NonHDL 01/08/2017 149.34   Final   NOTE:  Non-HDL goal should be 30 mg/dL higher than patient's LDL goal (i.e. LDL goal of < 70 mg/dL, would have non-HDL goal of < 100 mg/dL)    Physical Examination:  BP 134/90   Pulse 89   Ht 6' (1.829 m)   Wt (!) 332 lb (150.6 kg)   SpO2 96%   BMI 45.03 kg/m   REPEAT blood pressure with large cuff 130/86  Left side thyroid nodule palpable and about 2.5-3 cm in size Right-sided thyroid last minute felt, about 1-1/2 times normal and nodular    ASSESSMENT/PLAN:  Diabetes type 2 with morbid obesity  See history of present illness for detailed discussion of his current management, blood sugar patterns and problems identified  A1c is  6.3 now but he has gained weight despite taking Xigduo and Trulicity This is probably from not being consistent with diet and portion control as well as lack of exercise He thinks he will start exercising with joining gym and hopefully will improve his diet also consistently  He may benefit better from Ozempic compared to  Trulicity . Demonstrated the medication injection device and injection technique to the patient.  Patient will start with 0.25 mg dosage weekly for the first 2 weeks and then dial the dose to 0.5 mg every week Patient brochure on Ozempic with enclosed co-pay card given  Also encouraged him to start checking his blood sugars more regularly  Follow-up in 3 months     HYPERTENSION: Blood pressure is  still mildly increased, has been seen by PCP today and no changes made  LIPIDS: Being checked by PCP today    Patient Instructions  Check blood sugars on waking up  2/3  Also check blood sugars about 2 hours after a meal and do this after different meals by  rotation  Recommended blood sugar levels on waking up is 90-130 and about 2 hours after meal is 130-160  Please bring your blood sugar monitor to each visit, thank you     [facility] CenterKUMAR,Akane Tessier 01/08/2017, 8:36 PM   Note: This office note was prepared with Dragon voice recognition system technology. Any transcriptional errors that result from this process are unintentional.

## 2017-01-08 NOTE — Progress Notes (Signed)
Subjective:  Patient ID: Tyler Benton, male    DOB: 10/31/1970  Age: 46 y.o. MRN: [id number]  CC: Annual Exam; Hypertension; and Back Pain   HPI JORON VELIS presents for a CPX.  He complains of chronic, intermittent, nonradiating low back pain.  He had a recent set of films that showed degenerative disc disease.  He gets symptom relief with over-the-counter doses of anti-inflammatories.  He also complains of chronic right shoulder pain and decreasing range of motion in the right shoulder. He tells me his blood sugars have recently been well controlled.   He does not monitor his blood pressure but tells me he is not taking the ARB that is prescribed on his medication list.  He is not sure why he is not taking it. He is also not taking a statin.  He is not sure why.  Outpatient Medications Prior to Visit  Medication Sig Dispense Refill  . Dapagliflozin-Metformin HCl ER (XIGDUO XR) 06-998 MG TB24 Take 2 tablets daily by mouth. 60 tablet 0  . glucose blood (ONETOUCH VERIO) test strip Use to test blood sugar 1 time a day as instructed.Dx code E11.65 100 each 1  . ONE TOUCH LANCETS MISC Use to test blood sugar once daily 200 each 1  . ranitidine (ZANTAC) 150 MG tablet Take 150 mg by mouth 2 (two) times daily.    Marland Kitchen atorvastatin (LIPITOR) 10 MG tablet Take 1 tablet (10 mg total) by mouth daily. 30 tablet 3  . Dulaglutide (TRULICITY) 1.5 MG/0.5ML SOPN INJECT 1 SYRINGE INTO THE SKIN ONCE WEEKLY. REPLACING VICTOZA 2 mL 4  . valsartan (DIOVAN) 80 MG tablet Take 1 tablet (80 mg total) by mouth daily. 30 tablet 2   No facility-administered medications prior to visit.     ROS Review of Systems  Constitutional: Negative.  Negative for diaphoresis, fatigue and unexpected weight change.  HENT: Negative.   Eyes: Negative.  Negative for visual disturbance.  Respiratory: Negative.  Negative for cough, chest tightness, shortness of breath and wheezing.   Cardiovascular: Negative.  Negative for  chest pain, palpitations and leg swelling.  Gastrointestinal: Negative.  Negative for abdominal pain, constipation, diarrhea, nausea and vomiting.  Endocrine: Negative.  Negative for polydipsia, polyphagia and polyuria.  Genitourinary: Negative.  Negative for difficulty urinating, hematuria, penile swelling, scrotal swelling and testicular pain.  Musculoskeletal: Positive for arthralgias and back pain. Negative for myalgias and neck pain.  Skin: Negative.  Negative for color change and rash.  Allergic/Immunologic: Negative.   Neurological: Negative.  Negative for dizziness, weakness, numbness and headaches.  Hematological: Negative for adenopathy. Does not bruise/bleed easily.  Psychiatric/Behavioral: Negative.     Objective:  BP 136/84   Pulse 84   Temp 98.1 F (36.7 C) (Oral)   Ht 6' (1.829 m)   Wt (!) 332 lb (150.6 kg)   SpO2 97%   BMI 45.03 kg/m   BP Readings from Last 3 Encounters:  01/08/17 134/90  01/08/17 136/84  11/20/16 (!) 150/88    Wt Readings from Last 3 Encounters:  01/08/17 (!) 332 lb (150.6 kg)  01/08/17 (!) 332 lb (150.6 kg)  11/20/16 (!) 337 lb (152.9 kg)    Physical Exam  Constitutional: He is oriented to person, place, and time. No distress.  HENT:  Mouth/Throat: Oropharynx is clear and moist. No oropharyngeal exudate.  Eyes: Conjunctivae are normal. Right eye exhibits no discharge. Left eye exhibits no discharge. No scleral icterus.  Neck: Normal range of motion. Neck supple. No  JVD present. No thyromegaly present.  Cardiovascular: Normal rate, regular rhythm and normal heart sounds.  No murmur heard. Pulmonary/Chest: Effort normal and breath sounds normal. No respiratory distress. He has no wheezes. He has no rales.  Abdominal: Soft. Bowel sounds are normal. He exhibits no distension and no mass. There is no rebound and no guarding. Hernia confirmed negative in the right inguinal area and confirmed negative in the left inguinal area.  Genitourinary:  Rectum normal, prostate normal, testes normal and penis normal. Rectal exam shows no external hemorrhoid, no internal hemorrhoid, no fissure, no mass, no tenderness and guaiac negative stool. Prostate is not enlarged and not tender. Right testis shows no mass, no swelling and no tenderness. Right testis is descended. Left testis shows no swelling and no tenderness. Left testis is descended. Circumcised. No penile erythema or penile tenderness. No discharge found.  Musculoskeletal: He exhibits no edema, tenderness or deformity.       Right shoulder: He exhibits decreased range of motion. He exhibits no tenderness, no bony tenderness, no swelling and no deformity.       Lumbar back: Normal.  Lymphadenopathy:    He has no cervical adenopathy.       Right: No inguinal adenopathy present.       Left: No inguinal adenopathy present.  Neurological: He is alert and oriented to person, place, and time.  Skin: Skin is warm and dry. No rash noted. He is not diaphoretic. No erythema. No pallor.  Vitals reviewed.   Lab Results  Component Value Date   WBC 5.3 11/20/2016   HGB 15.2 11/20/2016   HCT 46.0 11/20/2016   PLT 268.0 11/20/2016   GLUCOSE 270 (H) 11/20/2016   CHOL 190 01/08/2017   TRIG 121.0 01/08/2017   HDL 40.60 01/08/2017   LDLDIRECT 141.3 01/12/2014   LDLCALC 125 (H) 01/08/2017   ALT 32 11/20/2016   AST 23 11/20/2016   NA 141 11/20/2016   K 3.9 11/20/2016   CL 103 11/20/2016   CREATININE 1.08 11/20/2016   BUN 11 11/20/2016   CO2 28 11/20/2016   TSH 0.76 03/27/2015   PSA 0.39 01/08/2017   HGBA1C 6.3 11/20/2016   MICROALBUR 10 11/20/2016    Dg Abd Acute W/chest  Result Date: 11/20/2016 CLINICAL DATA:  Left-sided flank pain for 1 month EXAM: DG ABDOMEN ACUTE W/ 1V CHEST COMPARISON:  None. FINDINGS: Cardiac shadow is within normal limits. The lungs are clear bilaterally. No acute bony abnormality is seen. Scattered large and small bowel gas is noted. Fecal material is noted within  the colon consistent with a mild degree of constipation. No definitive renal or ureteral stones are seen. Postsurgical changes in the left hip are noted. Degenerative change of the lumbar spine is seen. IMPRESSION: Mild constipation.  No other focal abnormality is noted. Electronically Signed   By: Alcide CleverMark  Lukens M.D.   On: 11/20/2016 20:57    Assessment & Plan:   Ramon Dredgedward was seen today for annual exam, hypertension and back pain.  Diagnoses and all orders for this visit:  Routine general medical examination at a health care facility- Exam completed, labs reviewed, vaccines reviewed and addressed, patient education material was given.  He has antibodies against hep B but not hep A.  Of asked him to return to start the vaccine process against hep a. -     Lipid panel; Future -     PSA; Future -     HIV antibody; Future  Essential hypertension- His blood pressure  is not adequately well controlled.  Will start an ARB. -     telmisartan (MICARDIS) 40 MG tablet; Take 1 tablet (40 mg total) by mouth daily.  Type 2 diabetes mellitus with complication, without long-term current use of insulin (HCC)- His A1c is at 6.3%.  His blood sugars are adequately well controlled.  Will start an ARB for renal protection. -     telmisartan (MICARDIS) 40 MG tablet; Take 1 tablet (40 mg total) by mouth daily. -     Ambulatory referral to Ophthalmology -     Hepatitis A antibody, total; Future -     Hepatitis B surface antibody; Future  DDD (degenerative disc disease), lumbar- He tells me the symptoms are not severe enough to need medical therapy.  Chronic pain in right shoulder- I have asked him to see sports medicine about this. -     Ambulatory referral to Sports Medicine  Hyperlipidemia LDL goal <100-he has not achieved his LDL goal.  Of asked him to start a statin for CV risk reduction. -     atorvastatin (LIPITOR) 20 MG tablet; Take 1 tablet (20 mg total) by mouth daily.  Other orders -     Pneumococcal  polysaccharide vaccine 23-valent greater than or equal to 2yo subcutaneous/IM   I have discontinued Chino L. Fatima's atorvastatin and valsartan. I am also having him start on telmisartan and atorvastatin. Additionally, I am having him maintain his ranitidine, glucose blood, ONE TOUCH LANCETS, and Dapagliflozin-Metformin HCl ER.  Meds ordered this encounter  Medications  . telmisartan (MICARDIS) 40 MG tablet    Sig: Take 1 tablet (40 mg total) by mouth daily.    Dispense:  90 tablet    Refill:  1  . atorvastatin (LIPITOR) 20 MG tablet    Sig: Take 1 tablet (20 mg total) by mouth daily.    Dispense:  90 tablet    Refill:  1     Follow-up: Return in about 6 months (around 07/09/2017).  Sanda Lingerhomas Gwenevere Goga, MD

## 2017-01-09 ENCOUNTER — Encounter: Payer: Self-pay | Admitting: Internal Medicine

## 2017-01-09 LAB — HIV ANTIBODY (ROUTINE TESTING W REFLEX): HIV 1&2 Ab, 4th Generation: NONREACTIVE

## 2017-01-09 LAB — HEPATITIS A ANTIBODY, TOTAL: Hepatitis A AB,Total: NONREACTIVE

## 2017-01-09 LAB — HEPATITIS B SURFACE ANTIBODY, QUANTITATIVE: Hepatitis B-Post: 242 m[IU]/mL (ref >=10)

## 2017-02-02 ENCOUNTER — Other Ambulatory Visit: Payer: Self-pay | Admitting: Endocrinology

## 2017-02-02 DIAGNOSIS — E1165 Type 2 diabetes mellitus with hyperglycemia: Principal | ICD-10-CM

## 2017-02-02 DIAGNOSIS — IMO0001 Reserved for inherently not codable concepts without codable children: Secondary | ICD-10-CM

## 2017-02-10 ENCOUNTER — Telehealth: Payer: Self-pay | Admitting: Endocrinology

## 2017-02-10 ENCOUNTER — Other Ambulatory Visit: Payer: Self-pay

## 2017-02-10 MED ORDER — SEMAGLUTIDE(0.25 OR 0.5MG/DOS) 2 MG/1.5ML ~~LOC~~ SOPN: 0.5000 mg | PEN_INJECTOR | SUBCUTANEOUS | 2 refills | Status: DC

## 2017-02-10 NOTE — Telephone Encounter (Signed)
This has been filled 

## 2017-02-10 NOTE — Telephone Encounter (Signed)
Need Refill of Semaglutide (OZEMPIC) 0.25 or 0.5 MG/DOSE SOPN [[id number]][[id number]]  XIGDUO XR 06-998 MG TB24 [[id number]][[id number]]   Send to  Pharmacy:  [id number] - [place], [place] - [address] AT [address] ([address])

## 2017-02-11 NOTE — Progress Notes (Signed)
[facility] [address] [place], [place] [address] Phone: [phone] Subjective:    I'm seeing this patient by the request  of:  Etta Grandchild, MD   CC: Right shoulder pain  WGN:[id number]  Tyler Benton is a 47 y.o. male coming in with complaint of right shoulder pain and lower back pain. Back gives him pain with standing and sitting and is becoming progressively worse. His shoulder is painful deep in the anterior aspect. He has noted some weakness with lifting.  Patient states that the same pain is sometimes is better 7 out of 10.  Symptoms can wake him up at night.  He does not think it has been improving and if anything seems to be increasing in weakening.  Denies any neck pain.  Some low back pain.  Has had it for quite some time.  Patient works as a Radiation protection practitioner.  States that repetitive activity seems to make it worse.        Past Medical History:  Diagnosis Date  . Diabetes mellitus without complication (HCC)   . GERD (gastroesophageal reflux disease)   . Obesity    Past Surgical History:  Procedure Laterality Date  . GROWTH PLATE SURGERY Bilateral 1980s  . WISDOM TOOTH EXTRACTION     Social History   Socioeconomic History  . Marital status: Legally Separated    Spouse name: None  . Number of children: None  . Years of education: None  . Highest education level: None  Social Needs  . Financial resource strain: None  . Food insecurity - worry: None  . Food insecurity - inability: None  . Transportation needs - medical: None  . Transportation needs - non-medical: None  Occupational History  . 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 Occupation: Paramedic  Tobacco Use  . Smoking status: Never Smoker  . Smokeless tobacco: Never Used  Substance and Sexual Activity  . Alcohol use: No  . Drug use: No  . Sexual activity: Not Currently  Other Topics Concern  . None  Social History Narrative  . None   No Known  Allergies Family History  Problem Relation Age of Onset  . Heart disease Mother   . Hypertension Mother   . Early death Neg Hx   . Alcohol abuse Neg Hx   . Birth defects Neg Hx   . Cancer Neg Hx   . COPD Neg Hx   . Drug abuse Neg Hx   . Hyperlipidemia Neg Hx   . Kidney disease Neg Hx   . Stroke Neg Hx   . Thyroid disease Neg Hx   . Diabetes Maternal Grandfather      Past medical history, social, surgical and family history all reviewed in electronic medical record.  No pertanent information unless stated regarding to the chief complaint.   Review of Systems:Review of systems updated and as accurate as of 02/12/17  No headache, visual changes, nausea, vomiting, diarrhea, constipation, dizziness, abdominal pain, skin rash, fevers, chills, night sweats, weight loss, swollen lymph nodes, body aches, joint swelling, muscle aches, chest pain, shortness of breath, mood changes.   Objective  Blood pressure (!) 152/100, pulse 75, height 6' (1.829 m), weight (!) 339 lb (153.8 kg), SpO2 96 %. Systems examined below as of 02/12/17   General: No apparent distress alert and oriented x3 mood and affect normal, dressed appropriately.  HEENT: Pupils equal, extraocular movements intact  Respiratory: Patient's speak in full sentences  and does not appear short of breath  Cardiovascular: No lower extremity edema, non tender, no erythema  Skin: Warm dry intact with no signs of infection or rash on extremities or on axial skeleton.  Abdomen: Soft nontender  Neuro: Cranial nerves II through XII are intact, neurovascularly intact in all extremities with 2+ DTRs and 2+ pulses.  Lymph: No lymphadenopathy of posterior or anterior cervical chain or axillae bilaterally.  Gait normal with good balance and coordination.  MSK:  Non tender with full range of motion and good stability and symmetric strength and tone of  elbows, wrist, hip, knee and ankles bilaterally.  Shoulder: Right Inspection reveals no  abnormalities, atrophy or asymmetry. Palpation is normal with no tenderness over AC joint or bicipital groove. ROM is 4+ out of 5 compared to the contralateral side Rotator cuff strength normal throughout. signs of impingement with positive Neer and Hawkin's tests, but negative empty can sign. Speeds and Yergason's tests normal. No labral pathology noted with negative Obrien's, negative clunk and good stability.  Positive crossover Normal scapular function observed. No painful arc and no drop arm sign. No apprehension sign Contralateral shoulder unremarkable   MSK US performed of: Right This study was ordered, performed, and interpreted by Terrilee FilesZach Smith D.O.  Shoulder:   Supraspinatus: Articular sided tear noted with what appears to be a very small Hill-Sachs deformity  bursal bulge seen with shoulder abduction on impingement view. Subscapularis:  Appears normal on long and transverse views. Positive bursa Teres Minor:  Appears normal on long and transverse views. AC joint: Arthritic changes Glenohumeral Joint:  Appears normal without effusion. Glenoid Labrum:  Intact without visualized tears. Biceps Tendon:  Appears normal on long and transverse views, no fraying of tendon, tendon located in intertubercular groove, no subluxation with shoulder internal or external rotation.  Impression: Partial rotator cuff tear  [id number]; 15 additional minutes spent for Therapeutic exercises as stated in above notes.  This included exercises focusing on stretching, strengthening, with significant focus on eccentric aspects.   Long term goals include an improvement in range of motion, strength, endurance as well as avoiding reinjury. Patient's frequency would include in 1-2 times a day, 3-5 times a week for a duration of 6-12 weeks. Shoulder Exercises that included:  Basic scapular stabilization to include adduction and depression of scapula Scaption, focusing on proper movement and good control Internal  and External rotation utilizing a theraband, with elbow tucked at side entire time Rows with theraband given   Proper technique shown and discussed handout in great detail with ATC.  All questions were discussed and answered.     Impression and Recommendations:     This case required medical decision making of moderate complexity.      Note: This dictation was prepared with Dragon dictation along with smaller phrase technology. Any transcriptional errors that result from this process are unintentional.

## 2017-02-12 ENCOUNTER — Ambulatory Visit: Payer: Self-pay

## 2017-02-12 ENCOUNTER — Encounter: Payer: Self-pay | Admitting: Family Medicine

## 2017-02-12 ENCOUNTER — Ambulatory Visit: Payer: BLUE CROSS/BLUE SHIELD | Admitting: Family Medicine

## 2017-02-12 ENCOUNTER — Ambulatory Visit (INDEPENDENT_AMBULATORY_CARE_PROVIDER_SITE_OTHER)
Admission: RE | Admit: 2017-02-12 | Discharge: 2017-02-12 | Disposition: A | Discharge status: Home / Self Care | Payer: BLUE CROSS/BLUE SHIELD | Source: Ambulatory Visit | Attending: Family Medicine | Admitting: Family Medicine

## 2017-02-12 VITALS — BP 152/100 | HR 75 | Ht 72.0 in | Wt 339.0 lb

## 2017-02-12 DIAGNOSIS — M5136 Other intervertebral disc degeneration, lumbar region: Secondary | ICD-10-CM

## 2017-02-12 DIAGNOSIS — M25511 Pain in right shoulder: Secondary | ICD-10-CM

## 2017-02-12 DIAGNOSIS — M75111 Incomplete rotator cuff tear or rupture of right shoulder, not specified as traumatic: Secondary | ICD-10-CM | POA: Diagnosis not present

## 2017-02-12 DIAGNOSIS — M545 Low back pain: Secondary | ICD-10-CM | POA: Diagnosis not present

## 2017-02-12 DIAGNOSIS — S4991XA Unspecified injury of right shoulder and upper arm, initial encounter: Secondary | ICD-10-CM | POA: Diagnosis not present

## 2017-02-12 MED ORDER — DICLOFENAC SODIUM 2 % TD SOLN: 2.0000 g | Freq: Two times a day (BID) | TRANSDERMAL | 3 refills | Status: DC

## 2017-02-12 MED ORDER — NITROGLYCERIN 0.2 MG/HR TD PT24: MEDICATED_PATCH | TRANSDERMAL | 1 refills | Status: DC

## 2017-02-12 NOTE — Assessment & Plan Note (Signed)
Patient does have a partial-thickness tear.  We will start nitroglycerin to the length of this.  There is no significant retraction.,  Work with Event organiserathletic trainer to learn home exercises in greater detail.  We discussed which activities of doing which wants to avoid.  Patient will follow up with me again in 4 weeks.

## 2017-02-12 NOTE — Assessment & Plan Note (Signed)
Known degenerative disc disease.  Trying topical anti-inflammatories, x-rays ordered today.  We discussed icing regimen.  Discussed which activities to do which wants to avoid.  Discussed the importance of weight loss.  When patient follows up we will consider further evaluation of the back and more greater detail.

## 2017-02-12 NOTE — Telephone Encounter (Signed)
Pt is calling back about script that was sent over and stated his pharmacy said they have not received it    Please advise [facility] [id number] - [place], [place] - [address] [facility] [address] [phone] (Phone) [phone] (Fax)

## 2017-02-12 NOTE — Patient Instructions (Addendum)
Good to see you.  Lets get xrays downstairs.   Ice 20 minutes 2 times daily. Usually after activity and before bed. Exercises 3 times a week.  pennsaid pinkie amount topically 2 times daily as needed.  Nitroglycerin Protocol   Apply 1/4 nitroglycerin patch to affected area daily.  Change position of patch within the affected area every 24 hours.  You may experience a headache during the first 1-2 weeks of using the patch, these should subside.  If you experience headaches after beginning nitroglycerin patch treatment, you may take your preferred over the counter pain reliever.  Another side effect of the nitroglycerin patch is skin irritation or rash related to patch adhesive.  Please notify our office if you develop more severe headaches or rash, and stop the patch.  Tendon healing with nitroglycerin patch may require 12 to 24 weeks depending on the extent of injury.  Men should not use if taking Viagra, Cialis, or Levitra.   Do not use if you have migraines or rosacea.   Keep hands within peripheral vision  See me again in 4 weeks

## 2017-02-13 ENCOUNTER — Other Ambulatory Visit: Payer: Self-pay | Admitting: Endocrinology

## 2017-02-13 ENCOUNTER — Other Ambulatory Visit: Payer: Self-pay

## 2017-02-13 DIAGNOSIS — IMO0001 Reserved for inherently not codable concepts without codable children: Secondary | ICD-10-CM

## 2017-02-13 DIAGNOSIS — E1165 Type 2 diabetes mellitus with hyperglycemia: Principal | ICD-10-CM

## 2017-02-13 MED ORDER — SEMAGLUTIDE(0.25 OR 0.5MG/DOS) 2 MG/1.5ML ~~LOC~~ SOPN: 0.5000 mg | PEN_INJECTOR | SUBCUTANEOUS | 2 refills | Status: DC

## 2017-02-13 NOTE — Telephone Encounter (Signed)
Patient need refill  Original Order:  Dapagliflozin-Metformin HCl ER (XIGDUO XR) 06-998 MG TB24 [[id number]][[id number]]    [facility]:  [id number] [place], [place] - [phone] [facility] [address] DEA #:  [id number]     [phone] (Phone) [phone] (Fax)

## 2017-02-13 NOTE — Telephone Encounter (Signed)
This was sent on 02/10/17 and confirmed by the pharmacy- I resent this today patient needs to contact the pharmacy

## 2017-02-19 ENCOUNTER — Other Ambulatory Visit: Payer: Self-pay

## 2017-02-19 DIAGNOSIS — E1165 Type 2 diabetes mellitus with hyperglycemia: Principal | ICD-10-CM

## 2017-02-19 DIAGNOSIS — IMO0001 Reserved for inherently not codable concepts without codable children: Secondary | ICD-10-CM

## 2017-02-19 MED ORDER — DAPAGLIFLOZIN PRO-METFORMIN ER 5-1000 MG PO TB24
2.0000 | ORAL_TABLET | Freq: Every day | ORAL | 4 refills | Status: DC
Start: 2017-02-19 — End: 2017-05-05

## 2017-02-19 NOTE — Telephone Encounter (Signed)
This was already sent 1/9 but I resent it today

## 2017-03-12 ENCOUNTER — Ambulatory Visit: Payer: BLUE CROSS/BLUE SHIELD | Admitting: Family Medicine

## 2017-03-12 DIAGNOSIS — Z0289 Encounter for other administrative examinations: Secondary | ICD-10-CM

## 2017-03-24 ENCOUNTER — Encounter: Payer: Self-pay | Admitting: Internal Medicine

## 2017-04-03 DIAGNOSIS — M9903 Segmental and somatic dysfunction of lumbar region: Secondary | ICD-10-CM | POA: Diagnosis not present

## 2017-04-03 DIAGNOSIS — M5136 Other intervertebral disc degeneration, lumbar region: Secondary | ICD-10-CM | POA: Diagnosis not present

## 2017-04-09 ENCOUNTER — Ambulatory Visit: Payer: BLUE CROSS/BLUE SHIELD | Admitting: Endocrinology

## 2017-04-09 DIAGNOSIS — Z0289 Encounter for other administrative examinations: Secondary | ICD-10-CM

## 2017-04-18 ENCOUNTER — Telehealth: Payer: Self-pay | Admitting: Endocrinology

## 2017-04-18 MED ORDER — DULAGLUTIDE 1.5 MG/0.5ML ~~LOC~~ SOAJ: 1.0000 | SUBCUTANEOUS | 1 refills | Status: DC

## 2017-04-18 NOTE — Telephone Encounter (Signed)
[facility] [place] - [phone] AT [address] to Registered [facility]    Semaglutide [facility](OZEMPIC) 0.25 or 0.5 MG/DOSE SOPN  Patient would like to speak to someone before this medication is sent in. He states that dr wanted to try a different medication   Please advise

## 2017-04-18 NOTE — Telephone Encounter (Signed)
Please check how much Ozempic he has been taking, 0.25 or 0.5

## 2017-04-18 NOTE — Addendum Note (Signed)
Addended by: Merrilyn PumaSIMMONS, STACEY N on: 04/18/2017 05:00 PM   Modules accepted: Orders

## 2017-04-18 NOTE — Telephone Encounter (Signed)
I called pt- He states the Trulicity better controlled his CBGs. He is out of Ozempic and is requesting Trulicity instead. Please advise.

## 2017-04-18 NOTE — Telephone Encounter (Signed)
Pt states he was taking Ozempic 0.5. He is requesting a 90 day supply of Trulicity due to insurance.

## 2017-04-18 NOTE — Telephone Encounter (Signed)
Please send 1.5 mg weekly of Trulicity, 12 devices

## 2017-04-18 NOTE — Telephone Encounter (Signed)
Done. See meds.  

## 2017-04-20 NOTE — Progress Notes (Deleted)
Patient ID: Tyler Benton, male   DOB: [birth year], 47 y.o.   MRN: [id number]            Reason for Appointment: Follow-up for Type 2 Diabetes  Referring physician: Sanda Linger  History of Present Illness:          Diagnosis: Type 2 diabetes mellitus, date of diagnosis: 2014       Past history: He had routine labs in 2014 which showed A1c of 6.6 and glucose of 158 fasting This was felt to be borderline and he was not started on any medication He was given Belviq for weight loss but he did not continue this after 2 weeks since he did not help and he found it expensive He was referred for evaluation because of poor blood sugar control in 8/15 and his blood sugar was significantly high with fasting glucose of 266 and A1c of 9.2 This was despite his trying to lose weight and he had lost 30 pounds along with exercising regularly His PCP had started treatment of his diabetes with a combination treatment using Victoza and Xigduo He was having poor control of his diabetes with A1c 9.2 in 2/17 because of noncompliance with on measures of self-care and medications and having symptomatic hyperglycemia  Recent history:  Non-insulin hypoglycemic drugs the patient is taking are: Xigduo 06/998, 2 tablets daily, Trulicity 1.5 mg weekly     He has not been seen in follow-up since 04/2016  A1c last done in 11/2016 was 6.3, previously 5.9  Current management, blood sugar patterns and problems identified:  He has checked his blood sugar sporadically again and will only twice recently, blood sugars midmorning 158 and lunchtime 128  However his lab glucose was 270 last month after eating cinnamon rolls at work  He has a gained significant amount of weight since his last visit  Previously had been on Victoza and this had been changed earlier this year to Trulicity, he thinks he is taking this regularly every week  For various reasons has not been exercising again but is planning to join a gym  He  is not considering seeing the dietitian because he thinks he knows what to do any generally is trying to limit carbohydrates and fats  No side effects with Xigduo       Side effects from medications have been:  none Compliance with the medical regimen: fair  Glucose monitoring:  done irregularly        Glucometer: One touch Verio   Blood Glucose readings from monitor download as follows:   He has only 2 readings in a month as above   Self-care: He has been consistent with diet usually   Meals: 3 meals per day. Breakfast is protein shake or oatmeal, lunch is frequently salad or sandwich. Dinner usually sandwich         Exercise: none  Dietician visit, most recent: 10/15            Weight history: was 380 in 2014  Wt Readings from Last 3 Encounters:  02/12/17 (!) 339 lb (153.8 kg)  01/08/17 (!) 332 lb (150.6 kg)  01/08/17 (!) 332 lb (150.6 kg)    Glycemic control:   Lab Results  Component Value Date   HGBA1C 6.3 11/20/2016   HGBA1C 5.9 04/11/2016   HGBA1C 5.6 12/01/2015   Lab Results  Component Value Date   MICROALBUR 10 11/20/2016   LDLCALC 125 (H) 01/08/2017   CREATININE 1.08 11/20/2016    Other  active problems: See review of systems   Allergies as of 04/21/2017   No Known Allergies     Medication List        Accurate as of 04/20/17  9:37 PM. Always use your most recent med list.          atorvastatin 20 MG tablet Commonly known as:  LIPITOR Take 1 tablet (20 mg total) by mouth daily.   Dapagliflozin-metFORMIN HCl ER 06-998 MG Tb24 Commonly known as:  XIGDUO XR Take 2 tablets by mouth daily.   Diclofenac Sodium 2 % Soln Commonly known as:  PENNSAID Place 2 g onto the skin 2 (two) times daily.   Dulaglutide 1.5 MG/0.5ML Sopn Commonly known as:  TRULICITY Inject 1 Syringe into the skin once a week.   glucose blood test strip Commonly known as:  ONETOUCH VERIO Use to test blood sugar 1 time a day as instructed.Dx code E11.65   nitroGLYCERIN  0.2 mg/hr patch Commonly known as:  NITRODUR - Dosed in mg/24 hr 1/4 patch daily   ONE TOUCH LANCETS Misc Use to test blood sugar once daily   ranitidine 150 MG tablet Commonly known as:  ZANTAC Take 150 mg by mouth 2 (two) times daily.   Semaglutide 0.25 or 0.5 MG/DOSE Sopn Commonly known as:  OZEMPIC Inject 0.5 mg into the skin once a week.   telmisartan 40 MG tablet Commonly known as:  MICARDIS Take 1 tablet (40 mg total) by mouth daily.       Allergies: No Known Allergies  Past Medical History:  Diagnosis Date  . Diabetes mellitus without complication (HCC)   . GERD (gastroesophageal reflux disease)   . Obesity     Past Surgical History:  Procedure Laterality Date  . GROWTH PLATE SURGERY Bilateral 1980s  . WISDOM TOOTH EXTRACTION      Family History  Problem Relation Age of Onset  . Heart disease Mother   . Hypertension Mother   . Early death Neg Hx   . Alcohol abuse Neg Hx   . Birth defects Neg Hx   . Cancer Neg Hx   . COPD Neg Hx   . Drug abuse Neg Hx   . Hyperlipidemia Neg Hx   . Kidney disease Neg Hx   . Stroke Neg Hx   . Thyroid disease Neg Hx   . Diabetes Maternal Grandfather     Social History:  reports that  has never smoked. he has never used smokeless tobacco. He reports that he does not drink alcohol or use drugs.    Review of Systems         Most recent eye exam was 5/.17       Lipids: He has been Taking Lipitor 10 mg daily LDL below 100     Lab Results  Component Value Date   CHOL 190 01/08/2017   HDL 40.60 01/08/2017   LDLCALC 125 (H) 01/08/2017   LDLDIRECT 141.3 01/12/2014   TRIG 121.0 01/08/2017   CHOLHDL 5 01/08/2017                  Thyroid:  No  unusual fatigue.   He was found to have left-sided thyroid nodule on exam and this was benign on biopsy On exam he has a 2.5-3 cm nodule on the left side  Lab Results  Component Value Date   TSH 0.76 03/27/2015       The blood pressure again appears to be Consistently  high He thinks blood pressure  at work is about 130/78-88 recently   BP Readings from Last 3 Encounters:  02/12/17 (!) 152/100  01/08/17 134/90  01/08/17 136/84    Previously liver functions were abnormal  Lab Results  Component Value Date   ALT 32 11/20/2016      LABS:  No visits with results within 1 Week(s) from this visit.  Latest known visit with results is:  Appointment on 01/08/2017  Component Date Value Ref Range Status  . Hepatitis B-Post 01/08/2017 242  > OR = 10 mIU/mL Final   Comment: . Patient has immunity to hepatitis B virus. . For additional information, please refer to http://education.questdiagnostics.com/faq/FAQ105 (This link is being provided for informational/ educational purposes only).   Marland Kitchen. HIV 1&2 Ab, 4th Generation 01/08/2017 NON-REACTIVE  NON-REACTI Final   Comment: HIV-1 antigen and HIV-1/HIV-2 antibodies were not detected. There is no laboratory evidence of HIV infection. Marland Kitchen. PLEASE NOTE: This information has been disclosed to you from records whose confidentiality may be protected by state law.  If your state requires such protection, then the state law prohibits you from making any further disclosure of the information without the specific written consent of the person to whom it pertains, or as otherwise permitted by law. A general authorization for the release of medical or other information is NOT sufficient for this purpose. . For additional information please refer to http://education.questdiagnostics.com/faq/FAQ106 (This link is being provided for informational/ educational purposes only.) . Marland Kitchen. The performance of this assay has not been clinically validated in patients less than 921 years old. .   . Hepatitis A AB,Total 01/08/2017 NON-REACTIVE  NON-REACTI Final  . PSA 01/08/2017 0.39  0.10 - 4.00 ng/mL Final   Test performed using Access Hybritech PSA Assay, a parmagnetic partical, chemiluminecent immunoassay.  . Cholesterol  01/08/2017 190  0 - 200 mg/dL Final   ATP III Classification       Desirable:  < 200 mg/dL               Borderline High:  200 - 239 mg/dL          High:  > = 161240 mg/dL  . Triglycerides 01/08/2017 121.0  0.0 - 149.0 mg/dL Final   Normal:  <096<150 mg/dLBorderline High:  150 - 199 mg/dL  . HDL 01/08/2017 40.60  >39.00 mg/dL Final  . VLDL 04/54/098112/06/2016 24.2  0.0 - 40.0 mg/dL Final  . LDL Cholesterol 01/08/2017 125* 0 - 99 mg/dL Final  . Total CHOL/HDL Ratio 01/08/2017 5   Final                  Men          Women1/2 Average Risk     3.4          3.3Average Risk          5.0          4.42X Average Risk          9.6          7.13X Average Risk          15.0          11.0                      . NonHDL 01/08/2017 149.34   Final   NOTE:  Non-HDL goal should be 30 mg/dL higher than patient's LDL goal (i.e. LDL goal of < 70 mg/dL, would have non-HDL goal of < 100 mg/dL)  Physical Examination:  There were no vitals taken for this visit.  REPEAT blood pressure with large cuff 130/86  Left side thyroid nodule palpable and about 2.5-3 cm in size Right-sided thyroid last minute felt, about 1-1/2 times normal and nodular    ASSESSMENT/PLAN:  Diabetes type 2 with morbid obesity  See history of present illness for detailed discussion of his current management, blood sugar patterns and problems identified  A1c is  6.3 now but he has gained weight despite taking Xigduo and Trulicity This is probably from not being consistent with diet and portion control as well as lack of exercise He thinks he will start exercising with joining gym and hopefully will improve his diet also consistently  He may benefit better from Ozempic compared to Trulicity . Demonstrated the medication injection device and injection technique to the patient.  Patient will start with 0.25 mg dosage weekly for the first 2 weeks and then dial the dose to 0.5 mg every week Patient brochure on Ozempic with enclosed co-pay card  given  Also encouraged him to start checking his blood sugars more regularly  Follow-up in 3 months     HYPERTENSION: Blood pressure is  still mildly increased, has been seen by PCP today and no changes made  LIPIDS: Being checked by PCP today    There are no Patient Instructions on file for this visit.   Reather Littler 04/20/2017, 9:37 PM   Note: This office note was prepared with Dragon voice recognition system technology. Any transcriptional errors that result from this process are unintentional.

## 2017-04-21 ENCOUNTER — Ambulatory Visit: Payer: BLUE CROSS/BLUE SHIELD | Admitting: Endocrinology

## 2017-05-01 ENCOUNTER — Telehealth: Payer: Self-pay | Admitting: Endocrinology

## 2017-05-01 NOTE — Telephone Encounter (Signed)
Patient stated he need this medication (XIGDUO XR) sent to [facility] [place] - [id number] AT [facility] to Registered [facility] DEA #:  --  He need it in 3 months increments Dapagliflozin-Metformin HCl ER ) 06-998 MG TB24 [[id number]]

## 2017-05-05 ENCOUNTER — Other Ambulatory Visit: Payer: Self-pay

## 2017-05-05 DIAGNOSIS — IMO0001 Reserved for inherently not codable concepts without codable children: Secondary | ICD-10-CM

## 2017-05-05 DIAGNOSIS — E1165 Type 2 diabetes mellitus with hyperglycemia: Principal | ICD-10-CM

## 2017-05-05 MED ORDER — DAPAGLIFLOZIN PRO-METFORMIN ER 5-1000 MG PO TB24
2.0000 | ORAL_TABLET | Freq: Every day | ORAL | 4 refills | Status: DC
Start: 2017-05-05 — End: 2017-12-26

## 2017-05-05 NOTE — Telephone Encounter (Signed)
This has been done.

## 2017-05-21 ENCOUNTER — Ambulatory Visit: Payer: BLUE CROSS/BLUE SHIELD | Admitting: Endocrinology

## 2017-05-26 ENCOUNTER — Ambulatory Visit: Payer: BLUE CROSS/BLUE SHIELD | Admitting: Endocrinology

## 2017-06-18 ENCOUNTER — Ambulatory Visit: Payer: BLUE CROSS/BLUE SHIELD | Admitting: Endocrinology

## 2017-06-18 ENCOUNTER — Other Ambulatory Visit (INDEPENDENT_AMBULATORY_CARE_PROVIDER_SITE_OTHER): Payer: BLUE CROSS/BLUE SHIELD

## 2017-06-18 DIAGNOSIS — E1165 Type 2 diabetes mellitus with hyperglycemia: Secondary | ICD-10-CM

## 2017-06-18 LAB — BASIC METABOLIC PANEL
BUN: 13 mg/dL (ref 6–23)
CO2: 27 mEq/L (ref 19–32)
Calcium: 10.1 mg/dL (ref 8.4–10.5)
Chloride: 101 mEq/L (ref 96–112)
Creatinine, Ser: 0.97 mg/dL (ref 0.40–1.50)
GFR: 107.63 mL/min (ref >60.00)
Glucose, Bld: 133 mg/dL — ABNORMAL HIGH (ref 70–99)
Potassium: 4.1 mEq/L (ref 3.5–5.1)
Sodium: 137 mEq/L (ref 135–145)

## 2017-06-18 LAB — HEMOGLOBIN A1C: Hgb A1c MFr Bld: 6 % (ref 4.6–6.5)

## 2017-06-26 ENCOUNTER — Other Ambulatory Visit: Payer: Self-pay | Admitting: Internal Medicine

## 2017-06-26 DIAGNOSIS — E785 Hyperlipidemia, unspecified: Secondary | ICD-10-CM

## 2017-06-26 DIAGNOSIS — E118 Type 2 diabetes mellitus with unspecified complications: Secondary | ICD-10-CM

## 2017-06-26 DIAGNOSIS — I10 Essential (primary) hypertension: Secondary | ICD-10-CM

## 2017-07-15 IMAGING — US US SOFT TISSUE HEAD/NECK
1 series · 13 of 25 positions shown · non-contrast
Comparison: 11/02/2013

CLINICAL DATA: 44-year-old male with a history of thyroid nodules

EXAM:
THYROID ULTRASOUND
TECHNIQUE: Ultrasound examination of the thyroid gland and adjacent soft
tissues was performed.

[Series 1: us soft tissue head/neck · 0.08mm/px · 13 of 48 slices shown]
[im 1/48]
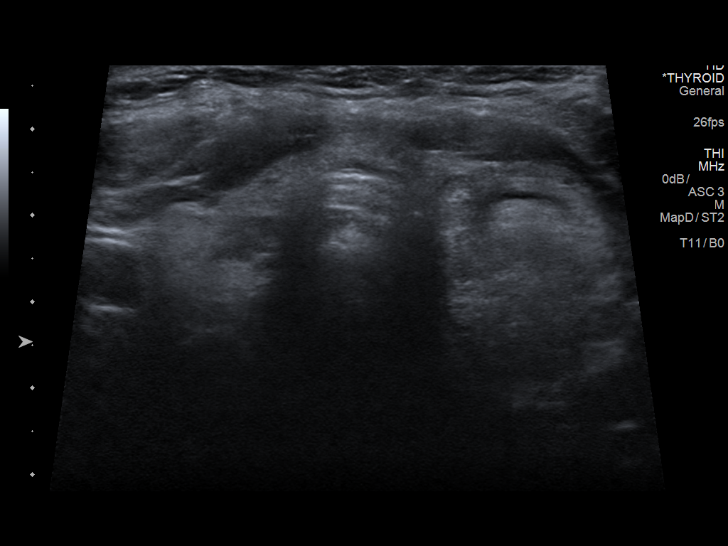
[im 4/48]
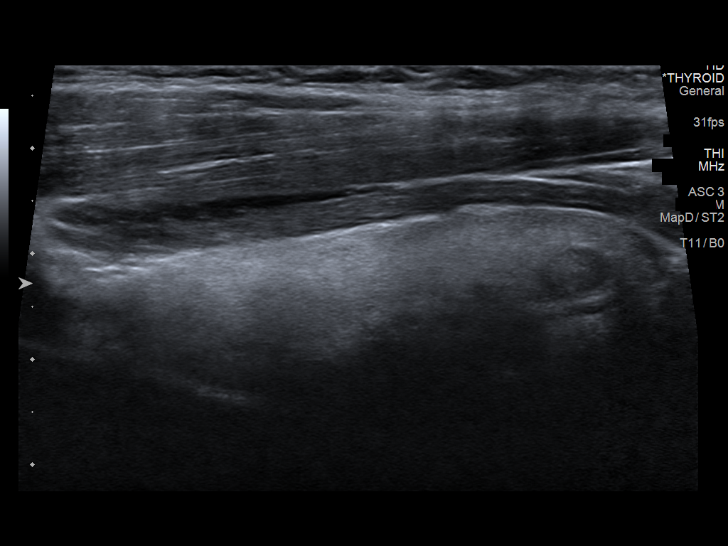
[im 8/48]
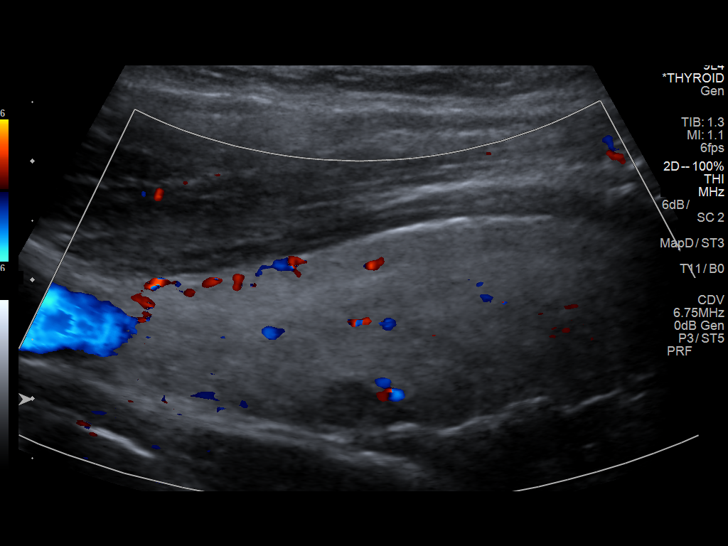
[im 12/48]
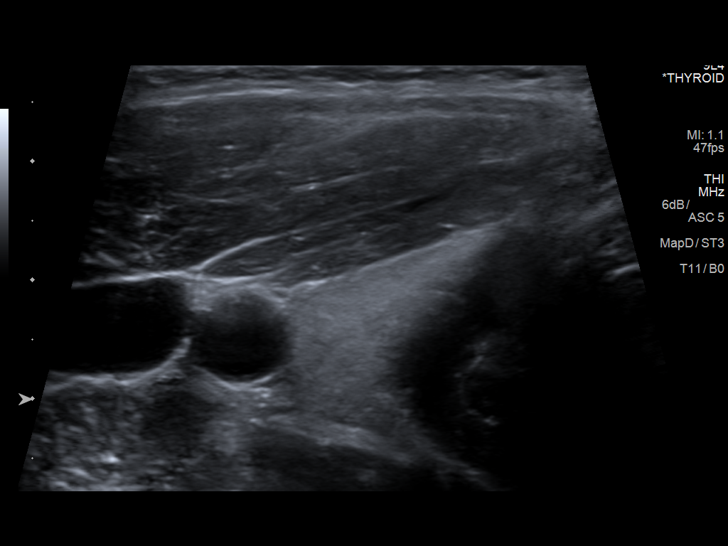
[im 16/48]
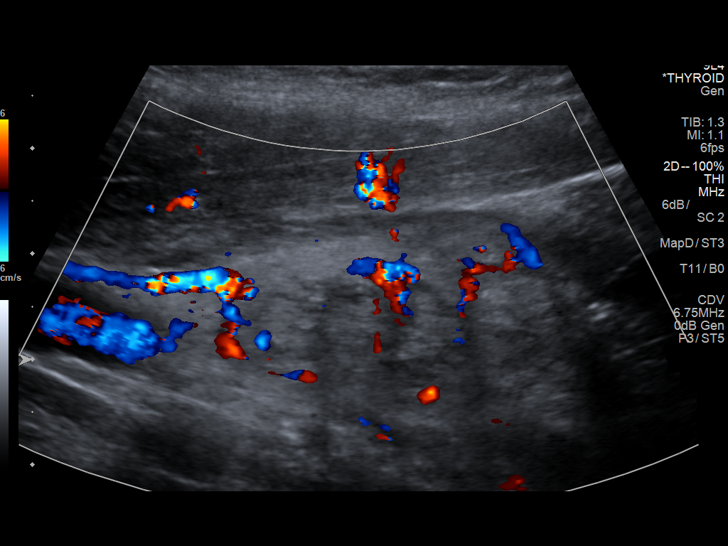
[im 20/48]
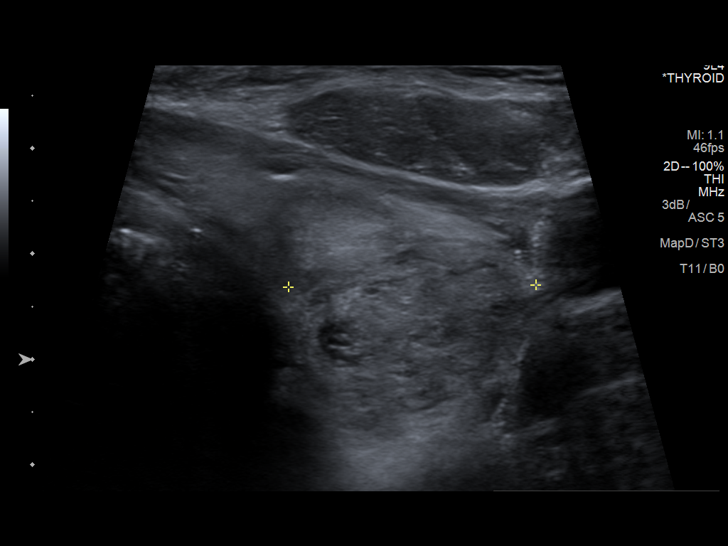
[im 24/48]
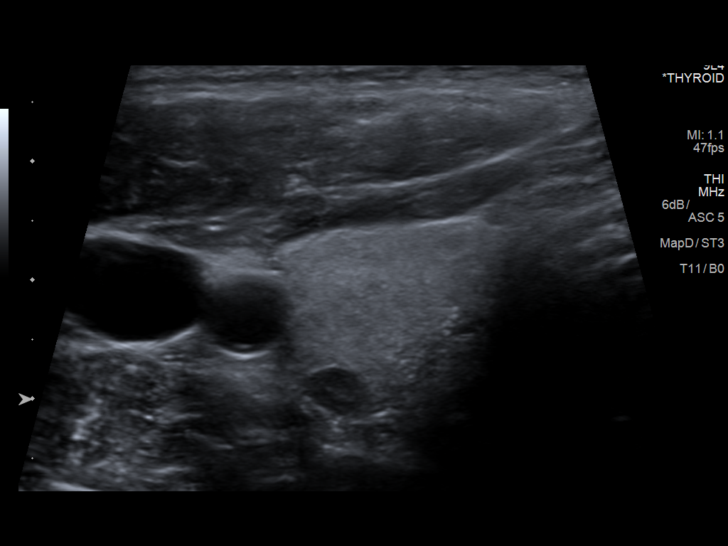
[im 28/48]
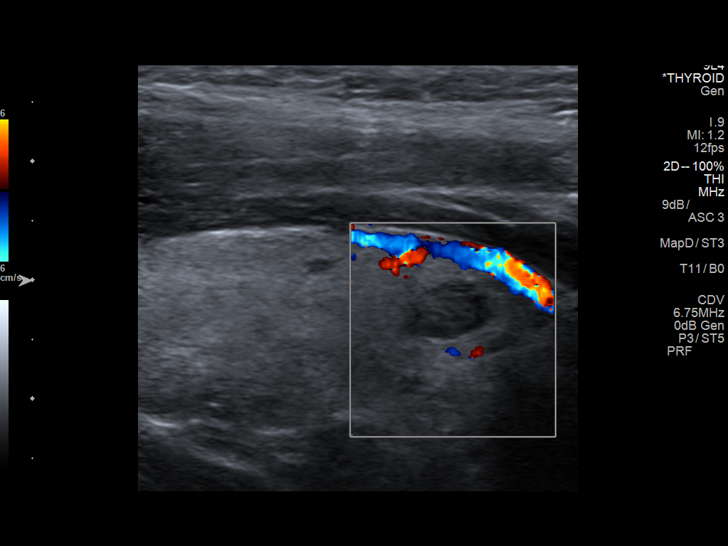
[im 32/48]
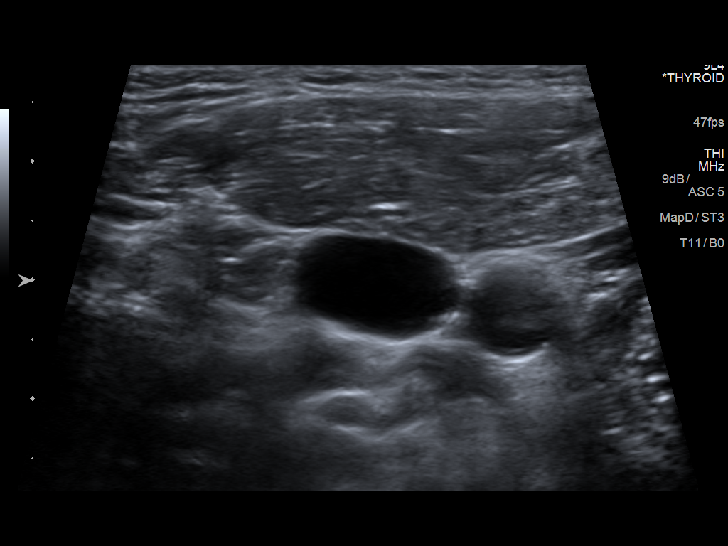
[im 36/48]
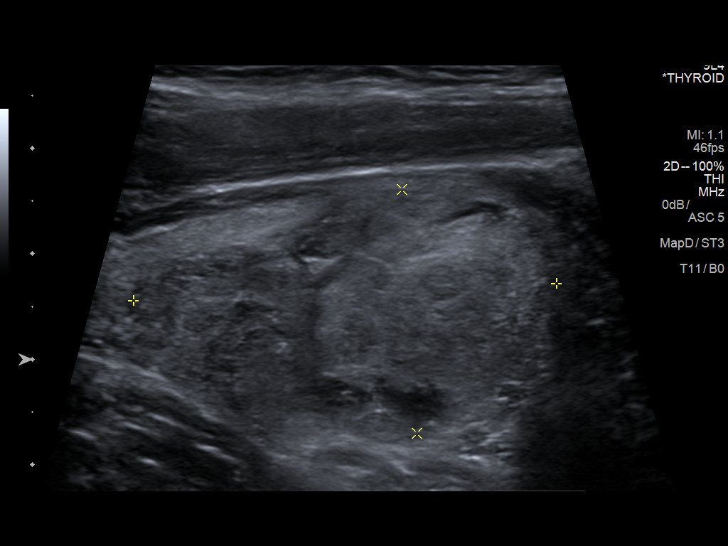
[im 40/48]
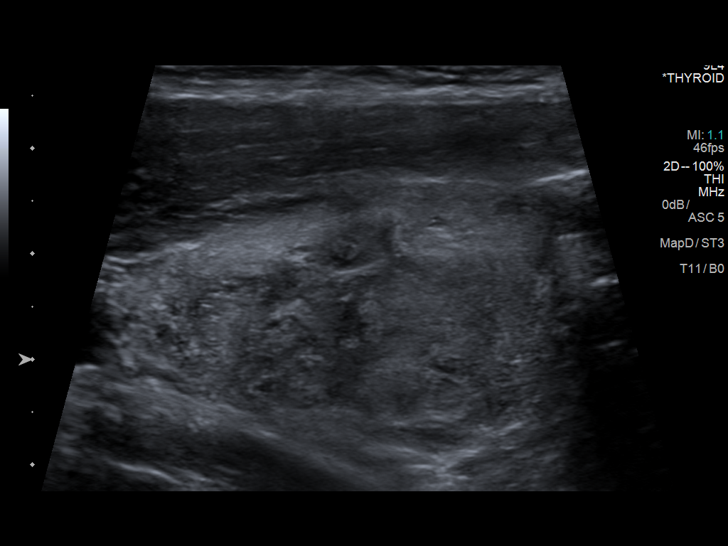
[im 44/48]
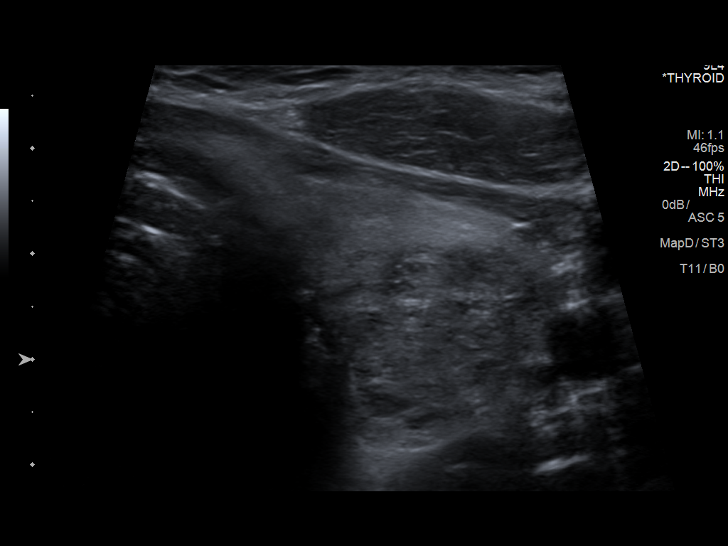
[im 48/48]
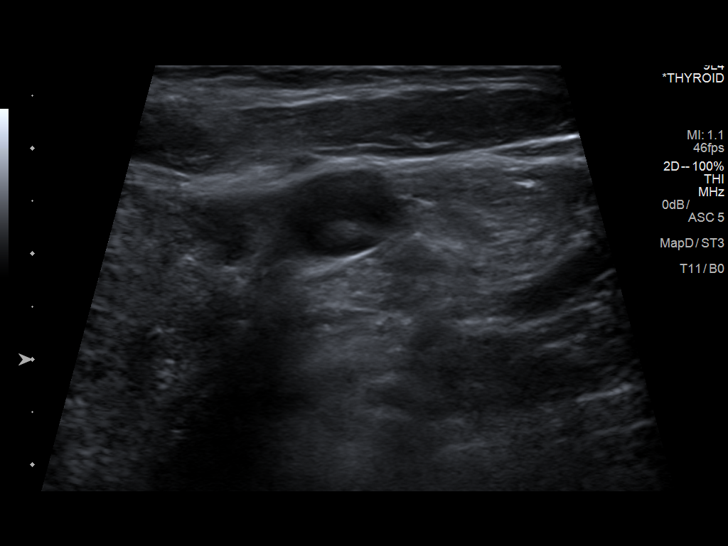

[13 of 25 positions shown; findings below may reference images not displayed]

FINDINGS: Right thyroid lobe

Measurements: 4.7 cm x 1.8 cm x 2.0 cm. Heterogeneous appearance of
the right thyroid.

Mid right thyroid nodule measures 6 mm x 5 mm x 4 mm.

Inferior right thyroid nodule measures 7 mm x 7 mm x 5 mm.

Left thyroid lobe

Measurements: 5.8 cm x 2.9 cm x 2.3 cm. Heterogeneous left thyroid
tissue, enlarged.

Nodule of thyroid has grown in the interval, now measures 4.0 cm x
2.9 cm x 2.3 cm. (Previous measurement 2.1 cm x 2.5 cm x 2.4 cm.

Isthmus

Thickness: 5 mm.  No nodules visualized.

Lymphadenopathy

None visualized.
IMPRESSION: Multinodular thyroid.

Left-sided thyroid nodule has increased in size in the interval, and
meets criteria for biopsy. There is no interval imaging history of a
prior biopsy.

Findings meet consensus criteria for biopsy, if not already
performed. Ultrasound-guided fine needle aspiration should be
considered, as per the consensus statement: Management of Thyroid
Nodules Detected at US: Society of Radiologists in Ultrasound

## 2017-07-31 ENCOUNTER — Encounter: Payer: Self-pay | Admitting: Endocrinology

## 2017-07-31 ENCOUNTER — Ambulatory Visit (INDEPENDENT_AMBULATORY_CARE_PROVIDER_SITE_OTHER): Payer: BLUE CROSS/BLUE SHIELD | Admitting: Endocrinology

## 2017-07-31 VITALS — BP 132/76 | HR 89 | Ht 72.0 in | Wt 323.8 lb

## 2017-07-31 DIAGNOSIS — E785 Hyperlipidemia, unspecified: Secondary | ICD-10-CM | POA: Diagnosis not present

## 2017-07-31 DIAGNOSIS — E1165 Type 2 diabetes mellitus with hyperglycemia: Secondary | ICD-10-CM

## 2017-07-31 NOTE — Patient Instructions (Signed)
Eye exam report  Check blood sugars on waking up  2/3/7   Also check blood sugars about 2 hours after a meal and do this after different meals by rotation  Recommended blood sugar levels on waking up is 90-130 and about 2 hours after meal is 130-160  Please bring your blood sugar monitor to each visit, thank you

## 2017-07-31 NOTE — Progress Notes (Signed)
Patient ID: Tyler Benton, male   DOB: November 01, 1970, 47 y.o.   MRN: [id number]            Reason for Appointment: Follow-up for Type 2 Diabetes  Referring physician: Sanda Linger  History of Present Illness:          Diagnosis: Type 2 diabetes mellitus, date of diagnosis: 2014       Past history: He had routine labs in 2014 which showed A1c of 6.6 and glucose of 158 fasting This was felt to be borderline and he was not started on any medication He was given Belviq for weight loss but he did not continue this after 2 weeks since he did not help and he found it expensive He was referred for evaluation because of poor blood sugar control in 8/15 and his blood sugar was significantly high with fasting glucose of 266 and A1c of 9.2 This was despite his trying to lose weight and he had lost 30 pounds along with exercising regularly His PCP had started treatment of his diabetes with a combination treatment using Victoza and Xigduo He was having poor control of his diabetes with A1c 9.2 in 2/17 because of noncompliance with on measures of self-care and medications and having symptomatic hyperglycemia  Recent history:  Non-insulin hypoglycemic drugs the patient is taking are: Xigduo 06/998, 2 tablets daily, Trulicity 1.5 mg weekly  He has not been seen in follow-up since 01/2017  A1c last done in 5/19 was 6.0 and is about the same   Current management, blood sugar patterns and problems identified:  He has checked his blood sugar very infrequently and has only 6 readings in the last month  Although he has done only 2 readings in the morning they appear to be relatively higher than later in the day  Highest reading was 170 after lunch and although he thinks he is cutting back on fried food and processed foods he may still occasionally have some sweet tea  He has however lost weight, previously about 11 pounds more  He says he is very active at work with walking but does not do any formal  exercise  Appears to be more committed to trying to watch his diet consistently  Also has been taking his Trulicity and Xigduo consistently  He was tried on Ozempic instead of Trulicity on the last visit but he felt that it was not helping his sugars much and he wanted to go back to Trulicity       Side effects from medications have been:  none Compliance with the medical regimen: fair  Glucose monitoring:  done irregularly        Glucometer: One touch Verio   Blood Glucose readings from monitor download as follows:   He has only 6 readings in a month with a range of 82-170 and median 124  Self-care: He has been consistent with diet usually   Meals: 3 meals per day. Breakfast is protein shake or oatmeal, lunch is frequently salad or sandwich. Dinner usually sandwich         Exercise: none formally  Dietician visit, most recent: 10/15            Weight history: was 380 in 2014  Wt Readings from Last 3 Encounters:  07/31/17 (!) 323 lb 12.8 oz (146.9 kg)  02/12/17 (!) 339 lb (153.8 kg)  01/08/17 (!) 332 lb (150.6 kg)    Glycemic control:   Lab Results  Component Value Date  HGBA1C 6.0 06/18/2017   HGBA1C 6.3 11/20/2016   HGBA1C 5.9 04/11/2016   Lab Results  Component Value Date   MICROALBUR 10 11/20/2016   LDLCALC 125 (H) 01/08/2017   CREATININE 0.97 06/18/2017   No visits with results within 1 Week(s) from this visit.  Latest known visit with results is:  Lab on 06/18/2017  Component Date Value Ref Range Status  . Sodium 06/18/2017 137  135 - 145 mEq/L Final  . Potassium 06/18/2017 4.1  3.5 - 5.1 mEq/L Final  . Chloride 06/18/2017 101  96 - 112 mEq/L Final  . CO2 06/18/2017 27  19 - 32 mEq/L Final  . Glucose, Bld 06/18/2017 133* 70 - 99 mg/dL Final  . BUN 16/11/9602 13  6 - 23 mg/dL Final  . Creatinine, Ser 06/18/2017 0.97  0.40 - 1.50 mg/dL Final  . Calcium 54/10/8117 10.1  8.4 - 10.5 mg/dL Final  . GFR 14/78/2956 107.63  >60.00 mL/min Final  . Hgb A1c MFr  Bld 06/18/2017 6.0  4.6 - 6.5 % Final   Glycemic Control Guidelines for People with Diabetes:Non Diabetic:  <6%Goal of Therapy: <7%Additional Action Suggested:  >8%     Other active problems: See review of systems   Allergies as of 07/31/2017   No Known Allergies     Medication List        Accurate as of 07/31/17 11:59 PM. Always use your most recent med list.          atorvastatin 20 MG tablet Commonly known as:  LIPITOR TAKE 1 TABLET DAILY   Dapagliflozin-metFORMIN HCl ER 06-998 MG Tb24 Commonly known as:  XIGDUO XR Take 2 tablets by mouth daily.   Diclofenac Sodium 2 % Soln Commonly known as:  PENNSAID Place 2 g onto the skin 2 (two) times daily.   Dulaglutide 1.5 MG/0.5ML Sopn Commonly known as:  TRULICITY Inject 1 Syringe into the skin once a week.   glucose blood test strip Commonly known as:  ONETOUCH VERIO Use to test blood sugar 1 time a day as instructed.Dx code E11.65   nitroGLYCERIN 0.2 mg/hr patch Commonly known as:  NITRODUR - Dosed in mg/24 hr 1/4 patch daily   ONE TOUCH LANCETS Misc Use to test blood sugar once daily   ranitidine 150 MG tablet Commonly known as:  ZANTAC Take 150 mg by mouth 2 (two) times daily.   telmisartan 40 MG tablet Commonly known as:  MICARDIS TAKE 1 TABLET DAILY       Allergies: No Known Allergies  Past Medical History:  Diagnosis Date  . Diabetes mellitus without complication (HCC)   . GERD (gastroesophageal reflux disease)   . Obesity     Past Surgical History:  Procedure Laterality Date  . GROWTH PLATE SURGERY Bilateral 1980s  . WISDOM TOOTH EXTRACTION      Family History  Problem Relation Age of Onset  . Heart disease Mother   . Hypertension Mother   . Early death Neg Hx   . Alcohol abuse Neg Hx   . Birth defects Neg Hx   . Cancer Neg Hx   . COPD Neg Hx   . Drug abuse Neg Hx   . Hyperlipidemia Neg Hx   . Kidney disease Neg Hx   . Stroke Neg Hx   . Thyroid disease Neg Hx   . Diabetes  Maternal Grandfather     Social History:  reports that he has never smoked. He has never used smokeless tobacco. He reports that he  does not drink alcohol or use drugs.    Review of Systems         Most recent eye exam was 5/17, needs follow-up       Lipids: He has been Taking Lipitor 20 mg daily LDL was somewhat above 100 but he thinks he is taking the supplement daily now     Lab Results  Component Value Date   CHOL 190 01/08/2017   HDL 40.60 01/08/2017   LDLCALC 125 (H) 01/08/2017   LDLDIRECT 141.3 01/12/2014   TRIG 121.0 01/08/2017   CHOLHDL 5 01/08/2017                  Thyroid:  No  unusual fatigue.   He was found to have left-sided thyroid nodule on initial exam and this was benign on biopsy On exam he has a 2.5-3 cm nodule on the left side  Lab Results  Component Value Date   TSH 0.76 03/27/2015       The blood pressure again appears to be much better controlled and he probably has done better with his diet and reducing sodium and high fat content  He says blood pressure at work is about 130s/78-80 recently   BP Readings from Last 3 Encounters:  07/31/17 132/76  02/12/17 (!) 152/100  01/08/17 134/90      LABS:  No visits with results within 1 Week(s) from this visit.  Latest known visit with results is:  Lab on 06/18/2017  Component Date Value Ref Range Status  . Sodium 06/18/2017 137  135 - 145 mEq/L Final  . Potassium 06/18/2017 4.1  3.5 - 5.1 mEq/L Final  . Chloride 06/18/2017 101  96 - 112 mEq/L Final  . CO2 06/18/2017 27  19 - 32 mEq/L Final  . Glucose, Bld 06/18/2017 133* 70 - 99 mg/dL Final  . BUN 16/11/9602 13  6 - 23 mg/dL Final  . Creatinine, Ser 06/18/2017 0.97  0.40 - 1.50 mg/dL Final  . Calcium 54/10/8117 10.1  8.4 - 10.5 mg/dL Final  . GFR 14/78/2956 107.63  >60.00 mL/min Final  . Hgb A1c MFr Bld 06/18/2017 6.0  4.6 - 6.5 % Final   Glycemic Control Guidelines for People with Diabetes:Non Diabetic:  <6%Goal of Therapy: <7%Additional  Action Suggested:  >8%     Physical Examination:  BP 132/76 (BP Location: Left Arm, Patient Position: Sitting, Cuff Size: Normal)   Pulse 89   Ht 6' (1.829 m)   Wt (!) 323 lb 12.8 oz (146.9 kg)   SpO2 98%   BMI 43.92 kg/m     Exam not indicated    ASSESSMENT/PLAN:  Diabetes type 2 with morbid obesity  See history of present illness for detailed discussion of his current management, blood sugar patterns and problems identified  A1c is  6 and is about the same as before However he has finally started losing some weight with changing his diet with cutting back on high calorie foods and some portion control However he can do more formal exercise And encouraged him to check his sugars more often including after meals to make sure his blood sugars are not going up especially after meals are overnight No change in medications at this time  Also encouraged him to start checking his blood sugars more regularly  Follow-up in 3 months     HYPERTENSION: Blood pressure is improved   LIPIDS:  checked by PCP and will need follow-up to make sure he is compliant with his  medication  Recommended regular eye exams and he will schedule, need to have a copy of his report sent to us  Patient Instructions  Eye exam report  Check blood sugars on waking up  2/3/7   Also check blood sugars about 2 hours after a meal and do this after different meals by rotation  Recommended blood sugar levels on waking up is 90-130 and about 2 hours after meal is 130-160  Please bring your blood sugar monitor to each visit, thank you     Reather LittlerAjay Towana Stenglein 08/01/2017, 8:13 AM   Note: This office note was prepared with Dragon voice recognition system technology. Any transcriptional errors that result from this process are unintentional.

## 2017-11-19 ENCOUNTER — Ambulatory Visit: Payer: BLUE CROSS/BLUE SHIELD | Admitting: Endocrinology

## 2017-12-26 ENCOUNTER — Other Ambulatory Visit: Payer: Self-pay

## 2017-12-26 ENCOUNTER — Telehealth: Payer: Self-pay | Admitting: Endocrinology

## 2017-12-26 DIAGNOSIS — E1165 Type 2 diabetes mellitus with hyperglycemia: Principal | ICD-10-CM

## 2017-12-26 DIAGNOSIS — IMO0001 Reserved for inherently not codable concepts without codable children: Secondary | ICD-10-CM

## 2017-12-26 MED ORDER — DULAGLUTIDE 1.5 MG/0.5ML ~~LOC~~ SOAJ: 1.0000 | SUBCUTANEOUS | 1 refills | Status: DC

## 2017-12-26 MED ORDER — DAPAGLIFLOZIN PRO-METFORMIN ER 5-1000 MG PO TB24: 2.0000 | ORAL_TABLET | Freq: Every day | ORAL | 4 refills | Status: DC

## 2017-12-26 NOTE — Telephone Encounter (Signed)
Patient is requesting these Rx's be refilled to new pharmacy. Dulaglutide (TRULICITY) 1.5 MG/0.5ML SOPN Dapagliflozin-metFORMIN HCl ER (XIGDUO XR) 06-998 MG TB24 [facility] [address]

## 2017-12-26 NOTE — Telephone Encounter (Signed)
This has been sent

## 2017-12-29 ENCOUNTER — Telehealth: Payer: Self-pay | Admitting: Endocrinology

## 2017-12-29 NOTE — Telephone Encounter (Signed)
noted 

## 2017-12-29 NOTE — Telephone Encounter (Signed)
Patient called in regards to two [facility]. Patient wanted to advice that they are sending paperwork for a prior auth. Per patients request contact when this has been done. thanks

## 2018-01-05 ENCOUNTER — Other Ambulatory Visit: Payer: Self-pay

## 2018-01-05 DIAGNOSIS — IMO0001 Reserved for inherently not codable concepts without codable children: Secondary | ICD-10-CM

## 2018-01-05 DIAGNOSIS — E1165 Type 2 diabetes mellitus with hyperglycemia: Principal | ICD-10-CM

## 2018-01-05 MED ORDER — DAPAGLIFLOZIN PRO-METFORMIN ER 5-1000 MG PO TB24: 2.0000 | ORAL_TABLET | Freq: Every day | ORAL | 0 refills | Status: DC

## 2018-01-05 MED ORDER — DULAGLUTIDE 1.5 MG/0.5ML ~~LOC~~ SOAJ: 1.0000 | SUBCUTANEOUS | 1 refills | Status: DC

## 2018-01-05 NOTE — Telephone Encounter (Signed)
Patient is asking if the PA paperwork has been received yet. I just put his insurance information in the system, I noticed that was missing. Please advise and call [facility] if it has not been received

## 2018-01-05 NOTE — Telephone Encounter (Signed)
Pt stated that he was without insurance, and now his insurance is back into effects and he needs a new RX. The new RX was sent in to [facility] per pt request. Pt stated that he thought the insurance said one of the medications needed a PA. Pt was informed that since a new rx was just sent to the pharamacy, if insurance requires a PA for either of the two medications, this office will be notified. Pt verbalized understanding.

## 2018-01-05 NOTE — Telephone Encounter (Signed)
error 

## 2018-01-06 ENCOUNTER — Telehealth: Payer: Self-pay

## 2018-01-06 NOTE — Telephone Encounter (Signed)
PA initiated for Trulicity 1.5mg /0.515mL pen injectors. Key:[id number]

## 2018-01-12 ENCOUNTER — Telehealth: Payer: Self-pay | Admitting: Endocrinology

## 2018-01-12 NOTE — Telephone Encounter (Signed)
Patient has called in regards to wanting to speak with Tyler Benton concerning the "battle" with insurance about an Rx. Please Advise, thanks

## 2018-01-16 ENCOUNTER — Other Ambulatory Visit: Payer: Self-pay | Admitting: Endocrinology

## 2018-01-16 ENCOUNTER — Other Ambulatory Visit: Payer: Self-pay

## 2018-01-16 ENCOUNTER — Ambulatory Visit: Payer: BLUE CROSS/BLUE SHIELD | Admitting: Endocrinology

## 2018-01-16 ENCOUNTER — Telehealth: Payer: Self-pay

## 2018-01-16 ENCOUNTER — Encounter: Payer: Self-pay | Admitting: Endocrinology

## 2018-01-16 VITALS — BP 140/76 | HR 83 | Ht 72.0 in | Wt 306.4 lb

## 2018-01-16 DIAGNOSIS — I10 Essential (primary) hypertension: Secondary | ICD-10-CM | POA: Diagnosis not present

## 2018-01-16 DIAGNOSIS — E1165 Type 2 diabetes mellitus with hyperglycemia: Secondary | ICD-10-CM | POA: Diagnosis not present

## 2018-01-16 LAB — COMPREHENSIVE METABOLIC PANEL
ALT: 37 U/L (ref 0–53)
AST: 32 U/L (ref 0–37)
Albumin: 4.3 g/dL (ref 3.5–5.2)
Alkaline Phosphatase: 72 U/L (ref 39–117)
BUN: 12 mg/dL (ref 6–23)
CO2: 24 mEq/L (ref 19–32)
Calcium: 9.9 mg/dL (ref 8.4–10.5)
Chloride: 92 mEq/L — ABNORMAL LOW (ref 96–112)
Creatinine, Ser: 0.93 mg/dL (ref 0.40–1.50)
GFR: 112.7 mL/min (ref >60.00)
Glucose, Bld: 592 mg/dL (ref 70–99)
Potassium: 4.2 mEq/L (ref 3.5–5.1)
Sodium: 128 mEq/L — ABNORMAL LOW (ref 135–145)
Total Bilirubin: 0.8 mg/dL (ref 0.2–1.2)
Total Protein: 7.5 g/dL (ref 6.0–8.3)

## 2018-01-16 LAB — POCT GLYCOSYLATED HEMOGLOBIN (HGB A1C): Hemoglobin A1C: 13.1 % — AB (ref 4.0–5.6)

## 2018-01-16 LAB — GLUCOSE, POCT (MANUAL RESULT ENTRY): POC Glucose: 516 mg/dl — AB (ref 70–99)

## 2018-01-16 MED ORDER — INSULIN ASPART PROT & ASPART (70-30 MIX) 100 UNIT/ML PEN: 30.0000 [IU] | PEN_INJECTOR | Freq: Two times a day (BID) | SUBCUTANEOUS | 0 refills | Status: DC

## 2018-01-16 MED ORDER — INSULIN LISPRO PROT & LISPRO (75-25 MIX) 100 UNIT/ML KWIKPEN: 30.0000 [IU] | PEN_INJECTOR | Freq: Two times a day (BID) | SUBCUTANEOUS | 11 refills | Status: DC

## 2018-01-16 MED ORDER — SEMAGLUTIDE 7 MG PO TABS: 1.0000 | ORAL_TABLET | Freq: Every day | ORAL | 11 refills | Status: DC

## 2018-01-16 MED ORDER — INSULIN PEN NEEDLE 31G X 5 MM MISC: 1 refills | Status: DC

## 2018-01-16 NOTE — Progress Notes (Signed)
Patient ID: Tyler Benton, male   DOB: 07-10-1970, 47 y.o.   MRN: [id number]            Reason for Appointment: Follow-up for Type 2 Diabetes  Referring physician: Sanda Linger  History of Present Illness:          Diagnosis: Type 2 diabetes mellitus, date of diagnosis: 2014       Past history: He had routine labs in 2014 which showed A1c of 6.6 and glucose of 158 fasting This was felt to be borderline and he was not started on any medication He was given Belviq for weight loss but he did not continue this after 2 weeks since he did not help and he found it expensive He was referred for evaluation because of poor blood sugar control in 8/15 and his blood sugar was significantly high with fasting glucose of 266 and A1c of 9.2 This was despite his trying to lose weight and he had lost 30 pounds along with exercising regularly His PCP had started treatment of his diabetes with a combination treatment using Victoza and Xigduo He was having poor control of his diabetes with A1c 9.2 in 2/17 because of noncompliance with on measures of self-care and medications and having symptomatic hyperglycemia  Recent history:  Non-insulin hypoglycemic drugs : None, was on Xigduo 06/998, 2 tablets daily, Trulicity 1.5 mg weekly  He has not been seen in follow-up since 07/2017  A1c last done in 5/19 was 6.0 and is now over 13   Current management, blood sugar patterns and problems identified:  He says his insurance did not cover his medications about 3 months ago he had some issues with continuing insurance and he stopped taking his diabetes medicine  He did not let us know that he had difficulty getting his medication  He says his sugars have been gradually going up but much higher in the last 3 weeks  He called today because blood sugar was over 500 at home  Recently avoiding regular soft drinks and drinks with sugar, occasionally has some juice  He has been excessively thirsty and losing  weight  This is despite his trying to exercise regularly also  He has only readings in his meter for last week and today, he checks his blood sugar at work mostly       Side effects from medications have been:  none Compliance with the medical regimen: fair  Glucose monitoring:  done irregularly        Glucometer: One touch Verio   Blood Glucose readings from monitor download show only 3 readings as above   Self-care: He has been consistent with diet usually   Meals: 3 meals per day. Breakfast is protein shake or oatmeal, lunch is frequently salad or sandwich. Dinner usually sandwich         Exercise: none formally  Dietician visit, most recent: 10/15            Weight history: was 380 in 2014  Wt Readings from Last 3 Encounters:  01/16/18 (!) 306 lb 6.4 oz (139 kg)  07/31/17 (!) 323 lb 12.8 oz (146.9 kg)  02/12/17 (!) 339 lb (153.8 kg)    Glycemic control:   Lab Results  Component Value Date   HGBA1C 13.1 (A) 01/16/2018   HGBA1C 6.0 06/18/2017   HGBA1C 6.3 11/20/2016   Lab Results  Component Value Date   MICROALBUR 10 11/20/2016   LDLCALC 125 (H) 01/08/2017   CREATININE 0.97 06/18/2017  Office Visit on 01/16/2018  Component Date Value Ref Range Status  . Hemoglobin A1C 01/16/2018 13.1* 4.0 - 5.6 % Final  . POC Glucose 01/16/2018 516* 70 - 99 mg/dl Final    Other active problems: See review of systems   Allergies as of 01/16/2018   No Known Allergies     Medication List       Accurate as of January 16, 2018 12:00 PM. Always use your most recent med list.        atorvastatin 20 MG tablet Commonly known as:  LIPITOR TAKE 1 TABLET DAILY   Dapagliflozin-metFORMIN HCl ER 06-998 MG Tb24 Commonly known as:  XIGDUO XR Take 2 tablets by mouth daily.   Diclofenac Sodium 2 % Soln Commonly known as:  PENNSAID Place 2 g onto the skin 2 (two) times daily.   Dulaglutide 1.5 MG/0.5ML Sopn Commonly known as:  TRULICITY Inject 1 Syringe into the skin once  a week.   glucose blood test strip Commonly known as:  ONETOUCH VERIO Use to test blood sugar 1 time a day as instructed.Dx code E11.65   insulin aspart protamine - aspart (70-30) 100 UNIT/ML FlexPen Commonly known as:  NOVOLOG MIX 70/30 FLEXPEN Inject 0.3 mLs (30 Units total) into the skin 2 (two) times daily with a meal.   nitroGLYCERIN 0.2 mg/hr patch Commonly known as:  NITRODUR - Dosed in mg/24 hr 1/4 patch daily   ONE TOUCH LANCETS Misc Use to test blood sugar once daily   ranitidine 150 MG tablet Commonly known as:  ZANTAC Take 150 mg by mouth 2 (two) times daily.   RYBELSUS 7 MG Tabs Generic drug:  Semaglutide Take 1 tablet by mouth daily. Take 1 tablet by mouth once daily.   telmisartan 40 MG tablet Commonly known as:  MICARDIS TAKE 1 TABLET DAILY       Allergies: No Known Allergies  Past Medical History:  Diagnosis Date  . Diabetes mellitus without complication (HCC)   . GERD (gastroesophageal reflux disease)   . Obesity     Past Surgical History:  Procedure Laterality Date  . GROWTH PLATE SURGERY Bilateral 1980s  . WISDOM TOOTH EXTRACTION      Family History  Problem Relation Age of Onset  . Heart disease Mother   . Hypertension Mother   . Early death Neg Hx   . Alcohol abuse Neg Hx   . Birth defects Neg Hx   . Cancer Neg Hx   . COPD Neg Hx   . Drug abuse Neg Hx   . Hyperlipidemia Neg Hx   . Kidney disease Neg Hx   . Stroke Neg Hx   . Thyroid disease Neg Hx   . Diabetes Maternal Grandfather     Social History:  reports that he has never smoked. He has never used smokeless tobacco. He reports that he does not drink alcohol or use drugs.    Review of Systems         Most recent eye exam was 5/17, needs follow-up       Lipids: He has been prescribed Lipitor 20 mg daily by his PCP Not had any recent follow-up     Lab Results  Component Value Date   CHOL 190 01/08/2017   HDL 40.60 01/08/2017   LDLCALC 125 (H) 01/08/2017    LDLDIRECT 141.3 01/12/2014   TRIG 121.0 01/08/2017   CHOLHDL 5 01/08/2017  Thyroid:  No  unusual fatigue.   He was found to have left-sided thyroid nodule on initial exam and this was benign on biopsy On exam he has had a 2.5-3 cm nodule on the left side  Lab Results  Component Value Date   TSH 0.76 03/27/2015    HYPERTENSION: Has been treated with Micardis 40 mg Has not followed up with PCP regularly, measures at work     BP Readings from Last 3 Encounters:  01/16/18 140/76  07/31/17 132/76  02/12/17 (!) 152/100      LABS:  Office Visit on 01/16/2018  Component Date Value Ref Range Status  . Hemoglobin A1C 01/16/2018 13.1* 4.0 - 5.6 % Final  . POC Glucose 01/16/2018 516* 70 - 99 mg/dl Final    Physical Examination:  BP 140/76 (BP Location: Left Arm, Patient Position: Sitting, Cuff Size: Normal)   Pulse 83   Ht 6' (1.829 m)   Wt (!) 306 lb 6.4 oz (139 kg)   SpO2 93%   BMI 41.56 kg/m         ASSESSMENT/PLAN:  Diabetes type 2 with morbid obesity  See history of present illness for detailed discussion of his current management, blood sugar patterns and problems identified  A1c is 13.1, previously 6  He has not been regular with follow-up as before Recently his blood sugars are much higher because of leaving off his medications for the last 3 months Although he has had hyperglycemia in the past with not being on his medication regularly this is highly unusual for him to have blood sugars around 500 He is maybe insulin deficient now Also having hyperglycemia because of glucose toxicity  Discussed that we will need to treat him with insulin at least short-term to improve his blood sugar more quickly and relieved glucose toxicity Discussed in detail the actions of insulin, types of insulin available and for simplicity can go with premixed insulin twice a day Showed him the action profile of 70/30 insulin Discussed in detail how this should  be taken, starting doses and adjustment every 4 days x 4 units to get blood sugars to target  We will also need to get his Davonna BellingXigduo approved through his insurance or at least find out what is preferred For his GLP-1 treatment he will start Rybelsus orally since this would be simpler to take than injections Discussed reactions, possible side effects and titration schedule And may be more effective also Reminded him of the importance of being regular with follow-up, not leaving off medications arbitrarily and keeping a check on his blood sugars consistently In the short-term he will need to check his sugars 4 times a day  Patient Instructions:  Check blood sugars before each meal and bedtime  Insulin dose will be 20 units before breakfast and dinnertime, take before eating  As on the handout the morning dose will be adjusted based on the afternoon blood sugars every 4 days The dinnertime dose will be adjusted based on the average morning blood sugar every 4 days  Once your blood sugars are below about 120-130 start cutting back 4 units on each insulin dose progressively every 2-3 days  RYBELSUS: Take before breakfast with a sip of water, 30 minutes before eating Stay on the 3 mg dose until the end of the month and then start the 7 mg dose   Follow-up in 3 weeks and also will have him review his progress with nurse educator    HYPERTENSION: Blood pressure is controlled  LIPIDS: Usually checked by PCP and will need follow-up to make sure he is compliant with his medication  Counseling time on subjects discussed in assessment and plan sections is over 50% of today's 25 minute visit   Patient Instructions  Check blood sugars before each meal and bedtime  Insulin dose will be 20 units before breakfast and dinnertime, take before eating  As on the handout the morning dose will be adjusted based on the afternoon blood sugars every 4 days The dinnertime dose will be adjusted based on the  average morning blood sugar every 4 days  Once your blood sugars are below about 120-130 start cutting back 4 units on each insulin dose progressively every 2-3 days  RYBELSUS: Take before breakfast with a sip of water, 30 minutes before eating Stay on the 3 mg dose until the end of the month and then start the 7 mg dose     Dayvian Blixt 01/16/2018, 12:00 PM   Note: This office note was prepared with Insurance underwriter. Any transcriptional errors that result from this process are unintentional.

## 2018-01-16 NOTE — Addendum Note (Signed)
Addended by: Romero BellingELLISON, Akyla Vavrek on: 01/16/2018 05:07 PM   Modules accepted: Orders

## 2018-01-16 NOTE — Telephone Encounter (Signed)
Main lab just called with a critical glucose of 592. Spoke with Dr. Deloria LairAbby she's aware and the Nurse Ammie is working on this now with the patient.

## 2018-01-16 NOTE — Telephone Encounter (Signed)
Penny received call from lab with critical glucose results of 500. Dr. Lucianne MussKumar not available. Made Dr. Everardo AllEllison aware of results. Will await further orders.  Pt also called stating Novolog 70/30 requires PA [facility] [address] - [place], [place] - [address] [place]. After speaking with pharmacist, alternative would be Humalog. Message routed to Dr. Everardo AllEllison for orders on this issue as well.  Will also route message to [facility] and Dr. Lucianne MussKumar for continuity of care and to address need for PA.

## 2018-01-16 NOTE — Patient Instructions (Signed)
Check blood sugars before each meal and bedtime  Insulin dose will be 20 units before breakfast and dinnertime, take before eating  As on the handout the morning dose will be adjusted based on the afternoon blood sugars every 4 days The dinnertime dose will be adjusted based on the average morning blood sugar every 4 days  Once your blood sugars are below about 120-130 start cutting back 4 units on each insulin dose progressively every 2-3 days  RYBELSUS: Take before breakfast with a sip of water, 30 minutes before eating Stay on the 3 mg dose until the end of the month and then start the 7 mg dose

## 2018-01-16 NOTE — Telephone Encounter (Signed)
please call patient: Blood sugar is 500. Please verify no n/v/sob.  If so, go to ER. I have sent a prescription to your pharmacy, for an insulin that ins covers.   Drinking fluids also helps.

## 2018-01-16 NOTE — Telephone Encounter (Signed)
Called pt and informed of critical lab results. Confirmed he is not having any N/V or dyspnea. Advised to remain hydrated. Should he develop any hyperglycemic s/sx's, advise to proceed to ED for treatment. Verbalized acceptance and understanding.   Also informed pt of new Rx for Humalog 75/25 in lieu of Novolog 70/30. Advised this will be a temporary Rx pending completion and determination of PA. Verbalized acceptance and understanding. Pt is also aware that a message has been routed to Dr. Lucianne MussKumar and Anette RiedelNoah for continuity of care.

## 2018-01-19 ENCOUNTER — Telehealth: Payer: Self-pay | Admitting: Endocrinology

## 2018-01-19 ENCOUNTER — Ambulatory Visit: Payer: Self-pay | Admitting: Endocrinology

## 2018-01-19 NOTE — Telephone Encounter (Signed)
Yes there is no reason to change that I know of.  He should have been on insulin the last 3 days because of his very high blood sugars

## 2018-01-19 NOTE — Telephone Encounter (Signed)
Noted. Pt is aware to remain on this medication.

## 2018-01-19 NOTE — Telephone Encounter (Signed)
Spoke with pt and he stated that he did not pick up the Humalog 75/25 mix this weekend. Would you like to keep the insulin at 75/25 mix?

## 2018-01-19 NOTE — Telephone Encounter (Signed)
Noted  

## 2018-01-19 NOTE — Telephone Encounter (Signed)
[facility] "Caller has been without insulin for 3 months and office called in Novalog, nut wasn't covered. It was switched to Humalog, but the pharm doesn't have it."

## 2018-01-27 ENCOUNTER — Telehealth: Payer: Self-pay

## 2018-01-27 NOTE — Telephone Encounter (Signed)
PA initiated via [email or web address] for Xigduo. Key:[id number]

## 2018-01-29 NOTE — Telephone Encounter (Signed)
[facility] [employer] stating that pt was denied coverage for Xigduo.  Insurance stated that it was denied because the pt has not had a poor response to or intolerance to [place], [place], or Synjardy XR. MD notified of this.

## 2018-02-02 ENCOUNTER — Telehealth: Payer: Self-pay | Admitting: Endocrinology

## 2018-02-02 ENCOUNTER — Other Ambulatory Visit: Payer: Self-pay

## 2018-02-02 MED ORDER — GLUCOSE BLOOD VI STRP: ORAL_STRIP | 4 refills | Status: DC

## 2018-02-02 NOTE — Telephone Encounter (Signed)
Rx sent 

## 2018-02-02 NOTE — Telephone Encounter (Signed)
MEDICATION: glucose blood (ONETOUCH VERIO) test strip  PHARMACY:  [id number]   IS THIS A 90 DAY SUPPLY : Yes  IS PATIENT OUT OF MEDICATION:   IF NOT; HOW MUCH IS LEFT: 1 week left  LAST APPOINTMENT DATE: @12 /24/2019  NEXT APPOINTMENT DATE:@1 /14/2020  DO WE HAVE YOUR PERMISSION TO LEAVE A DETAILED MESSAGE:  OTHER COMMENTS:    **Let patient know to contact pharmacy at the end of the day to make sure medication is ready. **  ** Please notify patient to allow 48-72 hours to process**  **Encourage patient to contact the pharmacy for refills or they can request refills through [facility]**

## 2018-02-03 ENCOUNTER — Ambulatory Visit: Payer: Self-pay | Admitting: Endocrinology

## 2018-02-04 NOTE — Progress Notes (Signed)
e  This encounter was created in error - please disregard.

## 2018-02-05 ENCOUNTER — Encounter: Payer: BLUE CROSS/BLUE SHIELD | Admitting: Endocrinology

## 2018-02-05 ENCOUNTER — Other Ambulatory Visit: Payer: Self-pay | Admitting: Endocrinology

## 2018-02-05 ENCOUNTER — Telehealth: Payer: Self-pay | Admitting: Endocrinology

## 2018-02-05 ENCOUNTER — Other Ambulatory Visit (INDEPENDENT_AMBULATORY_CARE_PROVIDER_SITE_OTHER): Payer: BLUE CROSS/BLUE SHIELD

## 2018-02-05 ENCOUNTER — Other Ambulatory Visit: Payer: Self-pay

## 2018-02-05 DIAGNOSIS — E785 Hyperlipidemia, unspecified: Secondary | ICD-10-CM | POA: Diagnosis not present

## 2018-02-05 DIAGNOSIS — E1165 Type 2 diabetes mellitus with hyperglycemia: Secondary | ICD-10-CM

## 2018-02-05 LAB — BASIC METABOLIC PANEL
BUN: 14 mg/dL (ref 6–23)
CO2: 28 mEq/L (ref 19–32)
Calcium: 9.2 mg/dL (ref 8.4–10.5)
Chloride: 102 mEq/L (ref 96–112)
Creatinine, Ser: 1.01 mg/dL (ref 0.40–1.50)
GFR: 102.43 mL/min (ref >60.00)
Glucose, Bld: 160 mg/dL — ABNORMAL HIGH (ref 70–99)
Potassium: 3.6 mEq/L (ref 3.5–5.1)
Sodium: 138 mEq/L (ref 135–145)

## 2018-02-05 LAB — LIPID PANEL
Cholesterol: 196 mg/dL (ref 0–200)
HDL: 42.4 mg/dL (ref >39.00)
LDL Cholesterol: 130 mg/dL — ABNORMAL HIGH (ref 0–99)
NonHDL: 153.48
Total CHOL/HDL Ratio: 5
Triglycerides: 117 mg/dL (ref 0.0–149.0)
VLDL: 23.4 mg/dL (ref 0.0–40.0)

## 2018-02-05 LAB — MICROALBUMIN / CREATININE URINE RATIO
Creatinine,U: 170.1 mg/dL
Microalb Creat Ratio: 0.4 mg/g (ref 0.0–30.0)
Microalb, Ur: <0.7 mg/dL (ref 0.0–1.9)

## 2018-02-05 NOTE — Telephone Encounter (Signed)
[employer] calling wanting to speak with who is responsible for prior auths for Dr. Lucianne Muss. Please Advice, thanks Ph # [phone] Reference # [id number]

## 2018-02-05 NOTE — Telephone Encounter (Signed)
Md is going to change medication, but first, MD wants to see pt in office.

## 2018-02-06 LAB — FRUCTOSAMINE: Fructosamine: 357 umol/L — ABNORMAL HIGH (ref 0–285)

## 2018-02-11 DIAGNOSIS — E119 Type 2 diabetes mellitus without complications: Secondary | ICD-10-CM | POA: Diagnosis not present

## 2018-02-11 LAB — HM DIABETES EYE EXAM

## 2018-02-17 ENCOUNTER — Encounter: Payer: Self-pay | Admitting: Endocrinology

## 2018-02-17 ENCOUNTER — Ambulatory Visit: Payer: BLUE CROSS/BLUE SHIELD | Admitting: Endocrinology

## 2018-02-17 ENCOUNTER — Ambulatory Visit (INDEPENDENT_AMBULATORY_CARE_PROVIDER_SITE_OTHER): Payer: BLUE CROSS/BLUE SHIELD | Admitting: Endocrinology

## 2018-02-17 VITALS — BP 148/98 | HR 83 | Ht 72.0 in | Wt 320.8 lb

## 2018-02-17 DIAGNOSIS — E785 Hyperlipidemia, unspecified: Secondary | ICD-10-CM | POA: Diagnosis not present

## 2018-02-17 DIAGNOSIS — E1165 Type 2 diabetes mellitus with hyperglycemia: Secondary | ICD-10-CM | POA: Diagnosis not present

## 2018-02-17 MED ORDER — ATORVASTATIN CALCIUM 20 MG PO TABS: 20.0000 mg | ORAL_TABLET | Freq: Every day | ORAL | 0 refills | Status: DC

## 2018-02-17 MED ORDER — EMPAGLIFLOZIN-METFORMIN HCL ER 12.5-1000 MG PO TB24: 2.0000 | ORAL_TABLET | Freq: Every day | ORAL | 2 refills | Status: DC

## 2018-02-17 NOTE — Patient Instructions (Signed)
Check blood sugars on waking up 5 days a week  Also check blood sugars about 2 hours after meals and do this after different meals by rotation  Recommended blood sugar levels on waking up are 90-130 and about 2 hours after meal is 130-180  Please bring your blood sugar monitor to each visit, thank you   If sugar < 90 reduce insulin 4 units

## 2018-02-17 NOTE — Progress Notes (Signed)
Patient ID: Tyler Benton, male   DOB: 10/30/1970, 48 y.o.   MRN: [id number]            Reason for Appointment: Follow-up for Type 2 Diabetes  Referring physician: Sanda Lingerhomas Jones  History of Present Illness:          Diagnosis: Type 2 diabetes mellitus, date of diagnosis: 2014       Past history: Tyler Benton had routine labs in 2014 which showed A1c of 6.6 and glucose of 158 fasting This was felt to be borderline and Tyler Benton was not started on any medication Tyler Benton was given Belviq for weight loss but Tyler Benton did not continue this after 2 weeks since Tyler Benton did not help and Tyler Benton found it expensive Tyler Benton was referred for evaluation because of poor blood sugar control in 8/15 and his blood sugar was significantly high with fasting glucose of 266 and A1c of 9.2 This was despite his trying to lose weight and Tyler Benton had lost 30 pounds along with exercising regularly His PCP had started treatment of his diabetes with a combination treatment using Victoza and Xigduo Tyler Benton was having poor control of his diabetes with A1c 9.2 in 2/17 because of noncompliance with on measures of self-care and medications and having symptomatic hyperglycemia  Recent history:  Non-insulin hypoglycemic drugs : Rybelsus 7 mg daily, was on Xigduo 06/998, 2 tablets daily  Insulin: 30 units twice daily of Humalog mix 75/25   A1c last done in 12/19 and had gone up to 13%   Current management, blood sugar patterns and problems identified:  Tyler Benton was started on insulin because of marked hyperglycemia  Tyler Benton has no difficulty doing this and has increased the dose from 20 up to 30 units  However his insurance has been denying the Xigduo and Tyler Benton has not taken this  For simplicity has been started on Rybelsus since Trulicity was also not covered  Tyler Benton has no nausea and is taking this early in the morning without food  Tyler Benton still thinks Tyler Benton is exercising  As expected his weight has gone up significantly with treating his hyperglycemia  Monitoring: Tyler Benton has done  this very sporadically and not every day  For the last 2 weeks or so his blood sugars have been mostly ranging between 136 and 166 with some readings at different times  Apparently tries to check some readings at work also  Tyler Benton is still trying to eat reasonably healthy       Side effects from medications have been:  none Compliance with the medical regimen: fair  Glucose monitoring:  done irregularly        Glucometer: One touch Verio   Blood Glucose readings from monitor download show blood sugars as above Average for the last 30 days is 244   Self-care: Tyler Benton has been consistent with diet usually   Meals: 3 meals per day. Breakfast is protein shake or oatmeal, lunch is frequently salad or sandwich. Dinner usually sandwich         Exercise:  3 times a week at the gym  Dietician visit, most recent: 10/15            Weight history: was 380 in 2014  Wt Readings from Last 3 Encounters:  02/17/18 (!) 320 lb 12.8 oz (145.5 kg)  01/16/18 (!) 306 lb 6.4 oz (139 kg)  07/31/17 (!) 323 lb 12.8 oz (146.9 kg)    Glycemic control:   Lab Results  Component Value Date   HGBA1C 13.1 (A)  01/16/2018   HGBA1C 6.0 06/18/2017   HGBA1C 6.3 11/20/2016   Lab Results  Component Value Date   MICROALBUR <0.7 02/05/2018   LDLCALC 130 (H) 02/05/2018   CREATININE 1.01 02/05/2018   No visits with results within 1 Week(s) from this visit.  Latest known visit with results is:  Lab on 02/05/2018  Component Date Value Ref Range Status  . Cholesterol 02/05/2018 196  0 - 200 mg/dL Final   ATP III Classification       Desirable:  < 200 mg/dL               Borderline High:  200 - 239 mg/dL          High:  > = 119240 mg/dL  . Triglycerides 02/05/2018 117.0  0.0 - 149.0 mg/dL Final   Normal:  <147<150 mg/dLBorderline High:  150 - 199 mg/dL  . HDL 02/05/2018 42.40  >39.00 mg/dL Final  . VLDL 82/95/621301/03/2018 23.4  0.0 - 40.0 mg/dL Final  . LDL Cholesterol 02/05/2018 130* 0 - 99 mg/dL Final  . Total CHOL/HDL Ratio  02/05/2018 5   Final                  Men          Women1/2 Average Risk     3.4          3.3Average Risk          5.0          4.42X Average Risk          9.6          7.13X Average Risk          15.0          11.0                      . NonHDL 02/05/2018 153.48   Final   NOTE:  Non-HDL goal should be 30 mg/dL higher than patient's LDL goal (i.e. LDL goal of < 70 mg/dL, would have non-HDL goal of < 100 mg/dL)  . Microalb, Ur 02/05/2018 <0.7  0.0 - 1.9 mg/dL Final  . Creatinine,U 08/65/784601/03/2018 170.1  mg/dL Final  . Microalb Creat Ratio 02/05/2018 0.4  0.0 - 30.0 mg/g Final  . Fructosamine 02/05/2018 357* 0 - 285 umol/L Final   Comment: Published reference interval for apparently healthy subjects between age 46 and 4360 is 70205 - 285 umol/L and in a poorly controlled diabetic population is 228 - 563 umol/L with a mean of 396 umol/L.   Marland Kitchen. Sodium 02/05/2018 138  135 - 145 mEq/L Final  . Potassium 02/05/2018 3.6  3.5 - 5.1 mEq/L Final  . Chloride 02/05/2018 102  96 - 112 mEq/L Final  . CO2 02/05/2018 28  19 - 32 mEq/L Final  . Glucose, Bld 02/05/2018 160* 70 - 99 mg/dL Final  . BUN 96/29/528401/03/2018 14  6 - 23 mg/dL Final  . Creatinine, Ser 02/05/2018 1.01  0.40 - 1.50 mg/dL Final  . Calcium 13/24/401001/03/2018 9.2  8.4 - 10.5 mg/dL Final  . GFR 27/25/366401/03/2018 102.43  >60.00 mL/min Final    Other active problems: See review of systems   Allergies as of 02/17/2018   No Known Allergies     Medication List       Accurate as of February 17, 2018  2:16 PM. Always use your most recent med list.        atorvastatin 20 MG  tablet Commonly known as:  LIPITOR Take 1 tablet (20 mg total) by mouth daily.   Diclofenac Sodium 2 % Soln Commonly known as:  PENNSAID Place 2 g onto the skin 2 (two) times daily.   Empagliflozin-metFORMIN HCl ER 12.06-998 MG Tb24 Commonly known as:  SYNJARDY XR Take 2 tablets by mouth daily with breakfast.   glucose blood test strip Commonly known as:  ONETOUCH VERIO Use to test blood  sugar 1 time a day as instructed.Dx code E11.65   Insulin Lispro Prot & Lispro (75-25) 100 UNIT/ML Kwikpen Commonly known as:  HUMALOG MIX 75/25 KWIKPEN Inject 30 Units into the skin 2 (two) times daily with a meal. And pen needles 2/day   Insulin Pen Needle 31G X 5 MM Misc Use on insulin pen 2x daily   nitroGLYCERIN 0.2 mg/hr patch Commonly known as:  NITRODUR - Dosed in mg/24 hr 1/4 patch daily   ONE TOUCH LANCETS Misc Use to test blood sugar once daily   ranitidine 150 MG tablet Commonly known as:  ZANTAC Take 150 mg by mouth 2 (two) times daily.   Semaglutide 7 MG Tabs Commonly known as:  RYBELSUS Take 1 tablet by mouth daily. Take 1 tablet by mouth once daily. DX:E11.65   telmisartan 40 MG tablet Commonly known as:  MICARDIS TAKE 1 TABLET DAILY       Allergies: No Known Allergies  Past Medical History:  Diagnosis Date  . Diabetes mellitus without complication (HCC)   . GERD (gastroesophageal reflux disease)   . Obesity     Past Surgical History:  Procedure Laterality Date  . GROWTH PLATE SURGERY Bilateral 1980s  . WISDOM TOOTH EXTRACTION      Family History  Problem Relation Age of Onset  . Heart disease Mother   . Hypertension Mother   . Early death Neg Hx   . Alcohol abuse Neg Hx   . Birth defects Neg Hx   . Cancer Neg Hx   . COPD Neg Hx   . Drug abuse Neg Hx   . Hyperlipidemia Neg Hx   . Kidney disease Neg Hx   . Stroke Neg Hx   . Thyroid disease Neg Hx   . Diabetes Maternal Grandfather     Social History:  reports that Tyler Benton has never smoked. Tyler Benton has never used smokeless tobacco. Tyler Benton reports that Tyler Benton does not drink alcohol or use drugs.    Review of Systems           Lipids: Tyler Benton has been prescribed Lipitor 20 mg daily by his PCP Not had any Rx recently and lipids are high     Lab Results  Component Value Date   CHOL 196 02/05/2018   HDL 42.40 02/05/2018   LDLCALC 130 (H) 02/05/2018   LDLDIRECT 141.3 01/12/2014   TRIG 117.0 02/05/2018     CHOLHDL 5 02/05/2018                  Thyroid:  No  unusual fatigue.   Tyler Benton was found to have left-sided thyroid nodule on initial exam and this was benign on biopsy On exam Tyler Benton has had a 2.5-3 cm nodule on the left side  Lab Results  Component Value Date   TSH 0.76 03/27/2015    HYPERTENSION: Has been treated with Micardis 40 mg Has not followed up with PCP regularly, measures at work, recent reading 130/70-80   BP Readings from Last 3 Encounters:  02/17/18 (!) 148/98  01/16/18 140/76  07/31/17  132/76      LABS:  No visits with results within 1 Week(s) from this visit.  Latest known visit with results is:  Lab on 02/05/2018  Component Date Value Ref Range Status  . Cholesterol 02/05/2018 196  0 - 200 mg/dL Final   ATP III Classification       Desirable:  < 200 mg/dL               Borderline High:  200 - 239 mg/dL          High:  > = 244 mg/dL  . Triglycerides 02/05/2018 117.0  0.0 - 149.0 mg/dL Final   Normal:  <010 mg/dLBorderline High:  150 - 199 mg/dL  . HDL 02/05/2018 42.40  >39.00 mg/dL Final  . VLDL 27/25/3664 23.4  0.0 - 40.0 mg/dL Final  . LDL Cholesterol 02/05/2018 130* 0 - 99 mg/dL Final  . Total CHOL/HDL Ratio 02/05/2018 5   Final                  Men          Women1/2 Average Risk     3.4          3.3Average Risk          5.0          4.42X Average Risk          9.6          7.13X Average Risk          15.0          11.0                      . NonHDL 02/05/2018 153.48   Final   NOTE:  Non-HDL goal should be 30 mg/dL higher than patient's LDL goal (i.e. LDL goal of < 70 mg/dL, would have non-HDL goal of < 100 mg/dL)  . Microalb, Ur 02/05/2018 <0.7  0.0 - 1.9 mg/dL Final  . Creatinine,U 40/34/7425 170.1  mg/dL Final  . Microalb Creat Ratio 02/05/2018 0.4  0.0 - 30.0 mg/g Final  . Fructosamine 02/05/2018 357* 0 - 285 umol/L Final   Comment: Published reference interval for apparently healthy subjects between age 70 and 26 is 84 - 285 umol/L and in a  poorly controlled diabetic population is 228 - 563 umol/L with a mean of 396 umol/L.   Marland Kitchen Sodium 02/05/2018 138  135 - 145 mEq/L Final  . Potassium 02/05/2018 3.6  3.5 - 5.1 mEq/L Final  . Chloride 02/05/2018 102  96 - 112 mEq/L Final  . CO2 02/05/2018 28  19 - 32 mEq/L Final  . Glucose, Bld 02/05/2018 160* 70 - 99 mg/dL Final  . BUN 95/63/8756 14  6 - 23 mg/dL Final  . Creatinine, Ser 02/05/2018 1.01  0.40 - 1.50 mg/dL Final  . Calcium 43/32/9518 9.2  8.4 - 10.5 mg/dL Final  . GFR 84/16/6063 102.43  >60.00 mL/min Final    Physical Examination:  BP (!) 148/98 (BP Location: Left Arm, Patient Position: Sitting, Cuff Size: Large)   Pulse 83   Ht 6' (1.829 m)   Wt (!) 320 lb 12.8 oz (145.5 kg)   SpO2 98%   BMI 43.51 kg/m         ASSESSMENT/PLAN:  Diabetes type 2 with morbid obesity  See history of present illness for detailed discussion of his current management, blood sugar patterns and problems identified  A1c is 13.1 in  December  With adding insulin his blood sugars are better and recently fructosamine was 357 However Tyler Benton still has readings as high as 180 this month Although Tyler Benton thinks Tyler Benton is doing well on his diet Tyler Benton is gaining back significant amount of weight This is despite exercise  Most likely Tyler Benton will do better if Tyler Benton is able to get metformin and an SGLT2 drug With prior authorization his Davonna Belling was denied again Also Tyler Benton is not compliant with monitoring blood sugars as directed Tyler Benton will now start taking Synjardy which appears to be covered by his insurance Discussed how this works, timing of medication Also discussed timing of glucose monitoring and blood sugar targets  Once his blood sugars are below about 90 Tyler Benton will start cutting back 4 units on each insulin dose progressively  RYBELSUS: Continue to take before breakfast with a sip of water, 30 minutes before eating Stay on the 7 mg dose for now   Follow-up in 6 weeks and also will have A1c on the next visit     HYPERTENSION: Blood pressure is reportedly controlled at work but higher today because of anxiety May also benefit from starting Synjardy   LIPIDS: Tyler Benton is not taking his Lipitor and LDL is 130, needs to start back on his Lipitor and prescription was sent  Counseling time on subjects discussed in assessment and plan sections is over 50% of today's 25 minute visit   Patient Instructions  Check blood sugars on waking up 5 days a week  Also check blood sugars about 2 hours after meals and do this after different meals by rotation  Recommended blood sugar levels on waking up are 90-130 and about 2 hours after meal is 130-180  Please bring your blood sugar monitor to each visit, thank you   If sugar < 90 reduce insulin 4 units    Tyler Benton 02/17/2018, 2:16 PM   Note: This office note was prepared with Dragon voice recognition system technology. Any transcriptional errors that result from this process are unintentional.

## 2018-02-18 ENCOUNTER — Telehealth: Payer: Self-pay

## 2018-02-18 NOTE — Telephone Encounter (Signed)
PA initiated via [email or web address] for Synjardy XR 12.5-1000mg  tablet. Take 2 tablets by mouth once daily with breakfast.   PA was approved for the next 12 months  PA approval number- [id number]

## 2018-03-10 ENCOUNTER — Other Ambulatory Visit: Payer: Self-pay | Admitting: Endocrinology

## 2018-03-11 ENCOUNTER — Other Ambulatory Visit: Payer: Self-pay

## 2018-03-11 MED ORDER — INSULIN LISPRO PROT & LISPRO (75-25 MIX) 100 UNIT/ML KWIKPEN: PEN_INJECTOR | SUBCUTANEOUS | 4 refills | Status: DC

## 2018-03-30 ENCOUNTER — Other Ambulatory Visit: Payer: Self-pay

## 2018-03-30 ENCOUNTER — Telehealth: Payer: Self-pay | Admitting: Endocrinology

## 2018-03-30 NOTE — Telephone Encounter (Signed)
Patient stated he still has not received the prescription for his new medication that Dr Lucianne Muss wanted him to take.  Please advise        [id number] - [place], [place] - [address] AT [address] ([address])

## 2018-03-31 ENCOUNTER — Ambulatory Visit: Payer: BLUE CROSS/BLUE SHIELD | Admitting: Endocrinology

## 2018-04-03 ENCOUNTER — Ambulatory Visit: Payer: BLUE CROSS/BLUE SHIELD | Admitting: Endocrinology

## 2018-04-09 ENCOUNTER — Ambulatory Visit: Payer: BLUE CROSS/BLUE SHIELD | Admitting: Endocrinology

## 2018-05-06 ENCOUNTER — Ambulatory Visit: Payer: BLUE CROSS/BLUE SHIELD | Admitting: Endocrinology

## 2018-05-08 ENCOUNTER — Encounter: Payer: Self-pay | Admitting: Endocrinology

## 2018-05-08 ENCOUNTER — Other Ambulatory Visit: Payer: Self-pay

## 2018-05-08 ENCOUNTER — Ambulatory Visit (INDEPENDENT_AMBULATORY_CARE_PROVIDER_SITE_OTHER): Payer: BLUE CROSS/BLUE SHIELD | Admitting: Endocrinology

## 2018-05-08 VITALS — BP 140/90 | HR 73 | Ht 72.0 in | Wt 331.2 lb

## 2018-05-08 DIAGNOSIS — E1165 Type 2 diabetes mellitus with hyperglycemia: Secondary | ICD-10-CM | POA: Diagnosis not present

## 2018-05-08 DIAGNOSIS — I10 Essential (primary) hypertension: Secondary | ICD-10-CM | POA: Diagnosis not present

## 2018-05-08 DIAGNOSIS — Z794 Long term (current) use of insulin: Secondary | ICD-10-CM | POA: Diagnosis not present

## 2018-05-08 LAB — HEMOGLOBIN A1C: Hemoglobin A1C: 6.5

## 2018-05-08 MED ORDER — CANAGLIFLOZIN-METFORMIN HCL ER 150-1000 MG PO TB24: 2.0000 | ORAL_TABLET | Freq: Every day | ORAL | 1 refills | Status: DC

## 2018-05-08 NOTE — Progress Notes (Signed)
Patient ID: Tyler Benton, male   DOB: 21-Dec-1970, 48 y.o.   MRN: [id number]            Reason for Appointment: Follow-up for Type 2 Diabetes  Referring physician: Sanda Linger  History of Present Illness:          Diagnosis: Type 2 diabetes mellitus, date of diagnosis: 2014       Past history: He had routine labs in 2014 which showed A1c of 6.6 and glucose of 158 fasting This was felt to be borderline and he was not started on any medication He was given Belviq for weight loss but he did not continue this after 2 weeks since he did not help and he found it expensive He was referred for evaluation because of poor blood sugar control in 8/15 and his blood sugar was significantly high with fasting glucose of 266 and A1c of 9.2 This was despite his trying to lose weight and he had lost 30 pounds along with exercising regularly His PCP had started treatment of his diabetes with a combination treatment using Victoza and Xigduo He was having poor control of his diabetes with A1c 9.2 in 2/17 because of noncompliance with on measures of self-care and medications and having symptomatic hyperglycemia  Recent history:  Non-insulin hypoglycemic drugs : Rybelsus 7 mg daily, was on Xigduo 06/998, 2 tablets daily  Insulin: 30 units twice daily of Humalog mix 75/25   A1c previously done in 12/19 and had gone up to 13% and now is back to below 6.5   Current management, blood sugar patterns and problems identified:  He was advised to start Synjardy XR instead of Xigduo which was denied by his insurance but this also has been denied by insurance and not clear why he is not getting coverage despite prior authorization  He has continued on insulin as before without any hypoglycemia  Although he thinks he is having reduced abdominal girth and better muscle strength from lifting weights a seems to have gained weight progressively since December  Also trying to do some walking  However as before he  has checked his blood sugars infrequently  Most of his blood sugars are mildly increased fasting blood today was 117  After meals blood sugars are less than 170  No side effects with Rybelsus       Side effects from medications have been:  none Compliance with the medical regimen: fair  Glucose monitoring:  done irregularly        Glucometer: One touch Verio   Blood Glucose readings from monitor download show blood sugars as below   PRE-MEAL  midday Lunch Dinner Bedtime Overall  Glucose range:  117-155      Mean/median:  141     148   POST-MEAL PC Breakfast PC Lunch PC Dinner  Glucose range:  164  147  148, 166  Mean/median:        Self-care: He has been consistent with diet usually   Meals: 3 meals per day. Breakfast is protein shake or oatmeal, lunch is frequently salad or sandwich. Dinner usually sandwich         Exercise:  3 times a week with walking and weights  Dietician visit, most recent: 10/15            Weight history: was 380 in 2014  Wt Readings from Last 3 Encounters:  05/08/18 (!) 331 lb 3.2 oz (150.2 kg)  02/17/18 (!) 320 lb 12.8 oz (145.5 kg)  01/16/18 (!) 306 lb 6.4 oz (139 kg)    Glycemic control:   Lab Results  Component Value Date   HGBA1C 13.1 (A) 01/16/2018   HGBA1C 6.0 06/18/2017   HGBA1C 6.3 11/20/2016   Lab Results  Component Value Date   MICROALBUR <0.7 02/05/2018   LDLCALC 130 (H) 02/05/2018   CREATININE 1.01 02/05/2018   No visits with results within 1 Week(s) from this visit.  Latest known visit with results is:  Lab on 02/05/2018  Component Date Value Ref Range Status  . Cholesterol 02/05/2018 196  0 - 200 mg/dL Final   ATP III Classification       Desirable:  < 200 mg/dL               Borderline High:  200 - 239 mg/dL          High:  > = 830 mg/dL  . Triglycerides 02/05/2018 117.0  0.0 - 149.0 mg/dL Final   Normal:  <940 mg/dLBorderline High:  150 - 199 mg/dL  . HDL 02/05/2018 42.40  >39.00 mg/dL Final  . VLDL 76/80/8811  23.4  0.0 - 40.0 mg/dL Final  . LDL Cholesterol 02/05/2018 130* 0 - 99 mg/dL Final  . Total CHOL/HDL Ratio 02/05/2018 5   Final                  Men          Women1/2 Average Risk     3.4          3.3Average Risk          5.0          4.42X Average Risk          9.6          7.13X Average Risk          15.0          11.0                      . NonHDL 02/05/2018 153.48   Final   NOTE:  Non-HDL goal should be 30 mg/dL higher than patient's LDL goal (i.e. LDL goal of < 70 mg/dL, would have non-HDL goal of < 100 mg/dL)  . Microalb, Ur 02/05/2018 <0.7  0.0 - 1.9 mg/dL Final  . Creatinine,U 04/18/9456 170.1  mg/dL Final  . Microalb Creat Ratio 02/05/2018 0.4  0.0 - 30.0 mg/g Final  . Fructosamine 02/05/2018 357* 0 - 285 umol/L Final   Comment: Published reference interval for apparently healthy subjects between age 59 and 103 is 49 - 285 umol/L and in a poorly controlled diabetic population is 228 - 563 umol/L with a mean of 396 umol/L.   Marland Kitchen Sodium 02/05/2018 138  135 - 145 mEq/L Final  . Potassium 02/05/2018 3.6  3.5 - 5.1 mEq/L Final  . Chloride 02/05/2018 102  96 - 112 mEq/L Final  . CO2 02/05/2018 28  19 - 32 mEq/L Final  . Glucose, Bld 02/05/2018 160* 70 - 99 mg/dL Final  . BUN 59/29/2446 14  6 - 23 mg/dL Final  . Creatinine, Ser 02/05/2018 1.01  0.40 - 1.50 mg/dL Final  . Calcium 28/63/8177 9.2  8.4 - 10.5 mg/dL Final  . GFR 11/65/7903 102.43  >60.00 mL/min Final    Other active problems: See review of systems   Allergies as of 05/08/2018   No Known Allergies     Medication List  Accurate as of May 08, 2018 11:59 PM. Always use your most recent med list.        atorvastatin 20 MG tablet Commonly known as:  LIPITOR Take 1 tablet (20 mg total) by mouth daily.   Canagliflozin-metFORMIN HCl ER [phone] MG Tb24 Commonly known as:  Invokamet XR Take 2 tablets by mouth daily with breakfast.   Diclofenac Sodium 2 % Soln Commonly known as:  Pennsaid Place 2 g onto the skin  2 (two) times daily.   Empagliflozin-metFORMIN HCl ER 12.06-998 MG Tb24 Commonly known as:  Synjardy XR Take 2 tablets by mouth daily with breakfast.   glucose blood test strip Commonly known as:  OneTouch Verio Use to test blood sugar 1 time a day as instructed.Dx code E11.65   Insulin Lispro Prot & Lispro (75-25) 100 UNIT/ML Kwikpen Commonly known as:  HumaLOG Mix 75/25 KwikPen INJECT 30 UNITS TWICE DAILY WITH A MEAL   Insulin Pen Needle 31G X 5 MM Misc Use on insulin pen 2x daily   nitroGLYCERIN 0.2 mg/hr patch Commonly known as:  NITRODUR - Dosed in mg/24 hr 1/4 patch daily   ONE TOUCH LANCETS Misc Use to test blood sugar once daily   ranitidine 150 MG tablet Commonly known as:  ZANTAC Take 150 mg by mouth 2 (two) times daily.   Semaglutide 7 MG Tabs Commonly known as:  Rybelsus Take 1 tablet by mouth daily. Take 1 tablet by mouth once daily. DX:E11.65   telmisartan 40 MG tablet Commonly known as:  MICARDIS TAKE 1 TABLET DAILY       Allergies: No Known Allergies  Past Medical History:  Diagnosis Date  . Diabetes mellitus without complication (HCC)   . GERD (gastroesophageal reflux disease)   . Obesity     Past Surgical History:  Procedure Laterality Date  . GROWTH PLATE SURGERY Bilateral 1980s  . WISDOM TOOTH EXTRACTION      Family History  Problem Relation Age of Onset  . Heart disease Mother   . Hypertension Mother   . Early death Neg Hx   . Alcohol abuse Neg Hx   . Birth defects Neg Hx   . Cancer Neg Hx   . COPD Neg Hx   . Drug abuse Neg Hx   . Hyperlipidemia Neg Hx   . Kidney disease Neg Hx   . Stroke Neg Hx   . Thyroid disease Neg Hx   . Diabetes Maternal Grandfather     Social History:  reports that he has never smoked. He has never used smokeless tobacco. He reports that he does not drink alcohol or use drugs.    Review of Systems           Lipids: He has been prescribed Lipitor 20 mg daily by his PCP This was started back  again last visit since he had run out of his prescription     Lab Results  Component Value Date   CHOL 196 02/05/2018   HDL 42.40 02/05/2018   LDLCALC 130 (H) 02/05/2018   LDLDIRECT 141.3 01/12/2014   TRIG 117.0 02/05/2018   CHOLHDL 5 02/05/2018                  Thyroid: He was found to have left-sided thyroid nodule on initial exam and this was benign on biopsy On exam he has had a 2.5-3 cm nodule on the left side  Lab Results  Component Value Date   TSH 0.76 03/27/2015    HYPERTENSION: Has  been treated with Micardis 40 mg by his PCP He has been checking blood pressure at work, recent reading 130/84-87   BP Readings from Last 3 Encounters:  05/08/18 140/90  02/17/18 (!) 148/98  01/16/18 140/76      LABS:  No visits with results within 1 Week(s) from this visit.  Latest known visit with results is:  Lab on 02/05/2018  Component Date Value Ref Range Status  . Cholesterol 02/05/2018 196  0 - 200 mg/dL Final   ATP III Classification       Desirable:  < 200 mg/dL               Borderline High:  200 - 239 mg/dL          High:  > = 213 mg/dL  . Triglycerides 02/05/2018 117.0  0.0 - 149.0 mg/dL Final   Normal:  <086 mg/dLBorderline High:  150 - 199 mg/dL  . HDL 02/05/2018 42.40  >39.00 mg/dL Final  . VLDL 57/84/6962 23.4  0.0 - 40.0 mg/dL Final  . LDL Cholesterol 02/05/2018 130* 0 - 99 mg/dL Final  . Total CHOL/HDL Ratio 02/05/2018 5   Final                  Men          Women1/2 Average Risk     3.4          3.3Average Risk          5.0          4.42X Average Risk          9.6          7.13X Average Risk          15.0          11.0                      . NonHDL 02/05/2018 153.48   Final   NOTE:  Non-HDL goal should be 30 mg/dL higher than patient's LDL goal (i.e. LDL goal of < 70 mg/dL, would have non-HDL goal of < 100 mg/dL)  . Microalb, Ur 02/05/2018 <0.7  0.0 - 1.9 mg/dL Final  . Creatinine,U 95/28/4132 170.1  mg/dL Final  . Microalb Creat Ratio 02/05/2018 0.4  0.0 -  30.0 mg/g Final  . Fructosamine 02/05/2018 357* 0 - 285 umol/L Final   Comment: Published reference interval for apparently healthy subjects between age 3 and 75 is 86 - 285 umol/L and in a poorly controlled diabetic population is 228 - 563 umol/L with a mean of 396 umol/L.   Marland Kitchen Sodium 02/05/2018 138  135 - 145 mEq/L Final  . Potassium 02/05/2018 3.6  3.5 - 5.1 mEq/L Final  . Chloride 02/05/2018 102  96 - 112 mEq/L Final  . CO2 02/05/2018 28  19 - 32 mEq/L Final  . Glucose, Bld 02/05/2018 160* 70 - 99 mg/dL Final  . BUN 44/02/270 14  6 - 23 mg/dL Final  . Creatinine, Ser 02/05/2018 1.01  0.40 - 1.50 mg/dL Final  . Calcium 53/66/4403 9.2  8.4 - 10.5 mg/dL Final  . GFR 47/42/5956 102.43  >60.00 mL/min Final    Physical Examination:  BP 140/90 (BP Location: Left Arm, Patient Position: Sitting, Cuff Size: Large)   Pulse 73   Ht 6' (1.829 m)   Wt (!) 331 lb 3.2 oz (150.2 kg)   SpO2 94%   BMI 44.92 kg/m   No ankle  edema     ASSESSMENT/PLAN:  Diabetes type 2 with morbid obesity  See history of present illness for detailed discussion of his current management, blood sugar patterns and problems identified  A1c is much better below 6.5 compared to 13.1 in December  With continuing about 50 units of insulin daily and Rybelsus his blood sugars are fairly well controlled A1c indicates significantly better levels although he is checking infrequently at home  His weight gain may be partly related to increased muscle mass but he was probably doing better with keeping his weight down using SGLT2 drugs Again difficult to get his medication approved through his insurance and he does not know what is covered  Currently appears to have coverage for Invokana and he will start on Invokamet prescription today He will get a co-pay card online    HYPERTENSION: Blood pressure is relatively controlled at work but higher diastolic He will benefit from starting Invokana   LIPIDS: He is again  taking his Lipitor and lipids need to be checked, he can do this when he follows up with his PCP next week   Patient Instructions  Invokana copay option Start 1 tab x 1 week  1st  Take 25 Units with this.    Reather LittlerAjay Kailynne Ferrington 05/10/2018, 11:45 AM   Note: This office note was prepared with Dragon voice recognition system technology. Any transcriptional errors that result from this process are unintentional.

## 2018-05-08 NOTE — Patient Instructions (Addendum)
Invokana copay option Start 1 tab x 1 week  1st  Take 25 Units with this.

## 2018-05-11 ENCOUNTER — Other Ambulatory Visit: Payer: Self-pay

## 2018-05-11 ENCOUNTER — Encounter: Payer: Self-pay | Admitting: Internal Medicine

## 2018-05-11 ENCOUNTER — Ambulatory Visit (INDEPENDENT_AMBULATORY_CARE_PROVIDER_SITE_OTHER): Payer: BLUE CROSS/BLUE SHIELD | Admitting: Internal Medicine

## 2018-05-11 ENCOUNTER — Other Ambulatory Visit (INDEPENDENT_AMBULATORY_CARE_PROVIDER_SITE_OTHER): Payer: BLUE CROSS/BLUE SHIELD

## 2018-05-11 VITALS — BP 132/100 | HR 78 | Temp 98.3°F | Resp 16 | Ht 72.0 in | Wt 333.8 lb

## 2018-05-11 DIAGNOSIS — I10 Essential (primary) hypertension: Secondary | ICD-10-CM

## 2018-05-11 DIAGNOSIS — Z794 Long term (current) use of insulin: Secondary | ICD-10-CM

## 2018-05-11 DIAGNOSIS — Z0000 Encounter for general adult medical examination without abnormal findings: Secondary | ICD-10-CM

## 2018-05-11 DIAGNOSIS — H1010 Acute atopic conjunctivitis, unspecified eye: Secondary | ICD-10-CM

## 2018-05-11 DIAGNOSIS — E118 Type 2 diabetes mellitus with unspecified complications: Secondary | ICD-10-CM

## 2018-05-11 DIAGNOSIS — E1165 Type 2 diabetes mellitus with hyperglycemia: Secondary | ICD-10-CM | POA: Diagnosis not present

## 2018-05-11 DIAGNOSIS — E785 Hyperlipidemia, unspecified: Secondary | ICD-10-CM

## 2018-05-11 DIAGNOSIS — H608X3 Other otitis externa, bilateral: Secondary | ICD-10-CM

## 2018-05-11 DIAGNOSIS — J309 Allergic rhinitis, unspecified: Secondary | ICD-10-CM | POA: Insufficient documentation

## 2018-05-11 LAB — TSH: TSH: 1.59 u[IU]/mL (ref 0.35–4.50)

## 2018-05-11 LAB — LIPID PANEL
Cholesterol: 199 mg/dL (ref 0–200)
HDL: 42.2 mg/dL (ref >39.00)
LDL Cholesterol: 139 mg/dL — ABNORMAL HIGH (ref 0–99)
NonHDL: 157.28
Total CHOL/HDL Ratio: 5
Triglycerides: 90 mg/dL (ref 0.0–149.0)
VLDL: 18 mg/dL (ref 0.0–40.0)

## 2018-05-11 LAB — PSA: PSA: 0.45 ng/mL (ref 0.10–4.00)

## 2018-05-11 MED ORDER — CHLORTHALIDONE 25 MG PO TABS: 12.5000 mg | ORAL_TABLET | Freq: Every day | ORAL | 1 refills | Status: DC

## 2018-05-11 MED ORDER — TRIAMCINOLONE ACETONIDE 55 MCG/ACT NA AERO: 2.0000 | INHALATION_SPRAY | Freq: Every day | NASAL | 3 refills | Status: DC

## 2018-05-11 MED ORDER — ROSUVASTATIN CALCIUM 20 MG PO TABS: 20.0000 mg | ORAL_TABLET | Freq: Every day | ORAL | 1 refills | Status: DC

## 2018-05-11 MED ORDER — LEVOCETIRIZINE DIHYDROCHLORIDE 5 MG PO TABS: 5.0000 mg | ORAL_TABLET | Freq: Every evening | ORAL | 1 refills | Status: DC

## 2018-05-11 NOTE — Patient Instructions (Signed)

## 2018-05-11 NOTE — Progress Notes (Addendum)
Subjective:  Patient ID: Tyler Benton, male    DOB: 11-15-1970  Age: 48 y.o. MRN: [id number]  CC: Annual Exam; Hypertension; and Allergic Rhinitis    HPI DANARIUS MCCONATHY presents for a CPX.  He complains of a 19-month history of bilateral upper and lower eyelids are puffiness.  He saw an ophthalmologist about a month ago and was told that there was no significant ocular pathology.  He also complains of nasal congestion, sneezing, and runny nose.  He uses Afrin intermittently.  He has been working on his lifestyle modifications and has been able to lose weight.  He said his blood sugars have come down quite a bit but his blood pressure has not been very well controlled.  When he works out he does not experience DOE, CP, edema, fatigue, or decreasing endurance.  He complains of chronic itching and flaking in his ear canals.  Outpatient Medications Prior to Visit  Medication Sig Dispense Refill  . glucose blood (ONETOUCH VERIO) test strip Use to test blood sugar 1 time a day as instructed.Dx code E11.65 100 each 4  . Insulin Lispro Prot & Lispro (HUMALOG MIX 75/25 KWIKPEN) (75-25) 100 UNIT/ML Kwikpen INJECT 30 UNITS TWICE DAILY WITH A MEAL 15 mL 4  . Insulin Pen Needle 31G X 5 MM MISC Use on insulin pen 2x daily 100 each 1  . ONE TOUCH LANCETS MISC Use to test blood sugar once daily 200 each 1  . Semaglutide (RYBELSUS) 7 MG TABS Take 1 tablet by mouth daily. Take 1 tablet by mouth once daily. DX:E11.65 30 tablet 11  . telmisartan (MICARDIS) 40 MG tablet TAKE 1 TABLET DAILY 90 tablet 0  . atorvastatin (LIPITOR) 20 MG tablet Take 1 tablet (20 mg total) by mouth daily. 90 tablet 0  . nitroGLYCERIN (NITRODUR - DOSED IN MG/24 HR) 0.2 mg/hr patch 1/4 patch daily 30 patch 1  . Canagliflozin-metFORMIN HCl ER (INVOKAMET XR) [phone] MG TB24 Take 2 tablets by mouth daily with breakfast. 60 tablet 1  . Diclofenac Sodium (PENNSAID) 2 % SOLN Place 2 g onto the skin 2 (two) times daily. 112 g 3  .  Empagliflozin-metFORMIN HCl ER (SYNJARDY XR) 12.06-998 MG TB24 Take 2 tablets by mouth daily with breakfast. 60 tablet 2  . ranitidine (ZANTAC) 150 MG tablet Take 150 mg by mouth 2 (two) times daily.     No facility-administered medications prior to visit.     ROS Review of Systems  Constitutional: Negative for diaphoresis and fatigue.  HENT: Positive for congestion, postnasal drip, rhinorrhea and sneezing. Negative for facial swelling, sinus pressure, sinus pain and sore throat.   Eyes: Negative for photophobia, pain and visual disturbance.  Respiratory: Negative.  Negative for cough, chest tightness, shortness of breath and wheezing.   Cardiovascular: Negative for palpitations and leg swelling.  Gastrointestinal: Negative for abdominal pain, constipation, diarrhea, nausea and vomiting.  Endocrine: Negative.  Negative for polydipsia, polyphagia and polyuria.  Genitourinary: Negative.  Negative for difficulty urinating.  Musculoskeletal: Negative.  Negative for arthralgias, back pain, myalgias and neck pain.  Skin: Negative.  Negative for color change and rash.  Neurological: Negative.  Negative for dizziness, weakness, light-headedness, numbness and headaches.  Hematological: Negative for adenopathy. Does not bruise/bleed easily.  Psychiatric/Behavioral: Negative.     Objective:  BP (!) 132/100 (BP Location: Left Arm, Patient Position: Sitting, Cuff Size: Large)   Pulse 78   Temp 98.3 F (36.8 C) (Oral)   Resp 16   Ht 6' (  1.829 m)   Wt (!) 333 lb 12 oz (151.4 kg)   SpO2 97%   BMI 45.26 kg/m   BP Readings from Last 3 Encounters:  05/11/18 (!) 132/100  05/08/18 140/90  02/17/18 (!) 148/98    Wt Readings from Last 3 Encounters:  05/11/18 (!) 333 lb 12 oz (151.4 kg)  05/08/18 (!) 331 lb 3.2 oz (150.2 kg)  02/17/18 (!) 320 lb 12.8 oz (145.5 kg)    Physical Exam Vitals signs reviewed.  Constitutional:      Appearance: He is obese. He is not ill-appearing or diaphoretic.   HENT:     Right Ear: Hearing, tympanic membrane and external ear normal. There is no impacted cerumen. No foreign body.     Left Ear: Hearing, tympanic membrane and external ear normal. There is no impacted cerumen. No foreign body.     Ears:     Comments: Both external auditory canals show scaling and flaking on the surfaces.    Nose: Mucosal edema present. No congestion or rhinorrhea.     Right Nostril: No epistaxis.     Left Nostril: No epistaxis.     Right Turbinates: Not swollen.     Left Turbinates: Not swollen.     Right Sinus: No maxillary sinus tenderness or frontal sinus tenderness.     Left Sinus: No maxillary sinus tenderness or frontal sinus tenderness.     Mouth/Throat:     Mouth: Mucous membranes are moist.     Pharynx: No oropharyngeal exudate or posterior oropharyngeal erythema.  Eyes:     General: No scleral icterus.    Extraocular Movements:     Right eye: Normal extraocular motion and no nystagmus.     Left eye: Normal extraocular motion and no nystagmus.     Conjunctiva/sclera: Conjunctivae normal.     Right eye: Right conjunctiva is not injected. No chemosis, exudate or hemorrhage.    Left eye: Left conjunctiva is not injected. No chemosis, exudate or hemorrhage.    Comments: There is no proptosis or proptosis. Bilaterally and symmetrically the upper and lower eyelids are very mildly swollen.  Neck:     Musculoskeletal: Normal range of motion and neck supple. No neck rigidity or muscular tenderness.  Cardiovascular:     Rate and Rhythm: Normal rate and regular rhythm.     Heart sounds: No murmur. No gallop.   Pulmonary:     Effort: Pulmonary effort is normal.     Breath sounds: No stridor. No wheezing, rhonchi or rales.  Abdominal:     General: Bowel sounds are normal.     Palpations: There is no hepatomegaly, splenomegaly or mass.     Tenderness: There is no abdominal tenderness. There is no guarding.     Hernia: There is no hernia in the right inguinal  area or left inguinal area.  Genitourinary:    Pubic Area: No rash.      Penis: Normal and circumcised. No discharge, swelling or lesions.      Scrotum/Testes: Normal.        Right: Mass, tenderness or swelling not present.        Left: Mass, tenderness or swelling not present.     Epididymis:     Right: Normal.     Left: Normal.     Prostate: Normal. Not enlarged, not tender and no nodules present.     Rectum: Guaiac result negative. No mass, tenderness, anal fissure, external hemorrhoid or internal hemorrhoid. Normal anal tone.  Musculoskeletal:  Normal range of motion.     Right lower leg: No edema.     Left lower leg: No edema.  Lymphadenopathy:     Cervical: No cervical adenopathy.     Lower Body: No right inguinal adenopathy. No left inguinal adenopathy.  Skin:    General: Skin is warm and dry.     Coloration: Skin is not pale.     Findings: No erythema or rash.  Neurological:     General: No focal deficit present.     Mental Status: He is alert and oriented to person, place, and time. Mental status is at baseline.  Psychiatric:        Mood and Affect: Mood normal.        Behavior: Behavior normal.     Lab Results  Component Value Date   WBC 5.3 11/20/2016   HGB 15.2 11/20/2016   HCT 46.0 11/20/2016   PLT 268.0 11/20/2016   GLUCOSE 160 (H) 02/05/2018   CHOL 199 05/11/2018   TRIG 90.0 05/11/2018   HDL 42.20 05/11/2018   LDLDIRECT 141.3 01/12/2014   LDLCALC 139 (H) 05/11/2018   ALT 37 01/16/2018   AST 32 01/16/2018   NA 138 02/05/2018   K 3.6 02/05/2018   CL 102 02/05/2018   CREATININE 1.01 02/05/2018   BUN 14 02/05/2018   CO2 28 02/05/2018   TSH 1.59 05/11/2018   PSA 0.45 05/11/2018   HGBA1C 6.5 05/08/2018   MICROALBUR <0.7 02/05/2018    Dg Lumbar Spine Complete  Result Date: 02/12/2017 CLINICAL DATA:  Chronic low back pain, worse with standing. EXAM: LUMBAR SPINE - COMPLETE 4+ VIEW COMPARISON:  None. FINDINGS: Five lumbar type vertebral bodies show  normal alignment. Mild disc space narrowing L4-5. Mild lower lumbar facet arthritis. No pars defect or slippage. Sacroiliac joints appear normal. IMPRESSION: Mild disc space narrowing L4-5.  No acute finding. Electronically Signed   By: Paulina FusiMark  Shogry M.D.   On: 02/12/2017 15:20   Dg Shoulder Right  Result Date: 02/12/2017 CLINICAL DATA:  Shoulder pain after injury 4 months ago. EXAM: RIGHT SHOULDER - 2+ VIEW COMPARISON:  None. FINDINGS: Glenohumeral joint appears normal. Narrowing of the humeral acromial distance suggesting rotator cuff pathology. Greater tuberosity osteophyte formation. AC joint degenerative change. IMPRESSION: No acute or traumatic finding. Narrowing of the humeral acromial distance. Greater tuberosity osteophyte formation. AC joint osteoarthritis. Electronically Signed   By: Paulina FusiMark  Shogry M.D.   On: 02/12/2017 15:21   [place] Limited Joint Space Structures Up Right  Result Date: 02/22/2017 [facility] US performed of: Right This study was ordered, performed, and interpreted by Terrilee FilesZach Smith D.O.  Shoulder:  Supraspinatus: Articular sided tear noted with what appears to be a very small Hill-Sachs deformity  bursal bulge seen with shoulder abduction on impingement view. Subscapularis:  Appears normal on long and transverse views. Positive bursa Teres Minor:  Appears normal on long and transverse views. AC joint: Arthritic changes Glenohumeral Joint:  Appears normal without effusion. Glenoid Labrum:  Intact without visualized tears. Biceps Tendon:  Appears normal on long and transverse views, no fraying of tendon, tendon located in intertubercular groove, no subluxation with shoulder internal or external rotation.  Impression: Partial rotator cuff tear   Assessment & Plan:   Ramon Dredgedward was seen today for annual exam, hypertension and allergic rhinitis .  Diagnoses and all orders for this visit:  Uncontrolled type 2 diabetes mellitus with hyperglycemia, with long-term current use of insulin (HCC)- His  recent A1c was 6.5%.  His  blood sugars are adequately well controlled.  Will continue the current regimen. -     HM Diabetes Foot Exam  Type II diabetes mellitus with manifestations (HCC)  Essential hypertension- His blood pressure is not adequately well controlled.  I have asked him to add a thiazide diuretic to the ARB. -     TSH; Future -     chlorthalidone (HYGROTON) 25 MG tablet; Take 0.5 tablets (12.5 mg total) by mouth daily.  Routine general medical examination at a health care facility- Exam completed, labs reviewed, vaccines reviewed and updated, patient education material was given. -     PSA; Future -     Lipid panel; Future  Hyperlipidemia LDL goal <100- He has not achieved his LDL goal with atorvastatin so I have asked him to upgrade to the more potent rosuvastatin. -     rosuvastatin (CRESTOR) 20 MG tablet; Take 1 tablet (20 mg total) by mouth daily.  Allergic rhinoconjunctivitis -     levocetirizine (XYZAL) 5 MG tablet; Take 1 tablet (5 mg total) by mouth every evening. -     triamcinolone (NASACORT) 55 MCG/ACT AERO nasal inhaler; Place 2 sprays into the nose daily.   I have discontinued Kennard L. Jellison's ranitidine, Diclofenac Sodium, nitroGLYCERIN, Empagliflozin-metFORMIN HCl ER, atorvastatin, and Canagliflozin-metFORMIN HCl ER. I am also having him start on chlorthalidone, rosuvastatin, levocetirizine, and triamcinolone. Additionally, I am having him maintain his ONE TOUCH LANCETS, telmisartan, Semaglutide, Insulin Pen Needle, glucose blood, and Insulin Lispro Prot & Lispro.  Meds ordered this encounter  Medications  . chlorthalidone (HYGROTON) 25 MG tablet    Sig: Take 0.5 tablets (12.5 mg total) by mouth daily.    Dispense:  45 tablet    Refill:  1  . rosuvastatin (CRESTOR) 20 MG tablet    Sig: Take 1 tablet (20 mg total) by mouth daily.    Dispense:  90 tablet    Refill:  1  . levocetirizine (XYZAL) 5 MG tablet    Sig: Take 1 tablet (5 mg total) by mouth every  evening.    Dispense:  90 tablet    Refill:  1  . triamcinolone (NASACORT) 55 MCG/ACT AERO nasal inhaler    Sig: Place 2 sprays into the nose daily.    Dispense:  32.4 mL    Refill:  3     Follow-up: Return in about 3 months (around 08/10/2018).  Sanda Linger, MD

## 2018-05-12 DIAGNOSIS — H608X3 Other otitis externa, bilateral: Secondary | ICD-10-CM | POA: Insufficient documentation

## 2018-05-12 MED ORDER — NEOMYCIN-POLYMYXIN-HC 1 % OT SOLN: 3.0000 [drp] | Freq: Three times a day (TID) | OTIC | 1 refills | Status: DC

## 2018-05-12 NOTE — Addendum Note (Signed)
Addended by: Etta Grandchild on: 05/12/2018 09:54 AM   Modules accepted: Orders

## 2018-08-05 ENCOUNTER — Ambulatory Visit (INDEPENDENT_AMBULATORY_CARE_PROVIDER_SITE_OTHER): Payer: BC Managed Care – PPO | Admitting: Endocrinology

## 2018-08-05 ENCOUNTER — Encounter: Payer: Self-pay | Admitting: Endocrinology

## 2018-08-05 ENCOUNTER — Other Ambulatory Visit: Payer: Self-pay

## 2018-08-05 VITALS — BP 148/100 | HR 90 | Ht 72.0 in | Wt 319.4 lb

## 2018-08-05 DIAGNOSIS — I10 Essential (primary) hypertension: Secondary | ICD-10-CM

## 2018-08-05 DIAGNOSIS — E785 Hyperlipidemia, unspecified: Secondary | ICD-10-CM

## 2018-08-05 DIAGNOSIS — E1165 Type 2 diabetes mellitus with hyperglycemia: Secondary | ICD-10-CM | POA: Diagnosis not present

## 2018-08-05 LAB — POCT GLYCOSYLATED HEMOGLOBIN (HGB A1C): Hemoglobin A1C: 5.7 % — AB (ref 4.0–5.6)

## 2018-08-05 LAB — COMPREHENSIVE METABOLIC PANEL
ALT: 25 U/L (ref 0–53)
AST: 20 U/L (ref 0–37)
Albumin: 4.3 g/dL (ref 3.5–5.2)
Alkaline Phosphatase: 47 U/L (ref 39–117)
BUN: 10 mg/dL (ref 6–23)
CO2: 28 mEq/L (ref 19–32)
Calcium: 9.6 mg/dL (ref 8.4–10.5)
Chloride: 106 mEq/L (ref 96–112)
Creatinine, Ser: 0.93 mg/dL (ref 0.40–1.50)
GFR: 105.77 mL/min (ref >60.00)
Glucose, Bld: 104 mg/dL — ABNORMAL HIGH (ref 70–99)
Potassium: 3.8 mEq/L (ref 3.5–5.1)
Sodium: 141 mEq/L (ref 135–145)
Total Bilirubin: 0.9 mg/dL (ref 0.2–1.2)
Total Protein: 7.4 g/dL (ref 6.0–8.3)

## 2018-08-05 LAB — LIPID PANEL
Cholesterol: 161 mg/dL (ref 0–200)
HDL: 38.4 mg/dL — ABNORMAL LOW (ref >39.00)
LDL Cholesterol: 104 mg/dL — ABNORMAL HIGH (ref 0–99)
NonHDL: 122.6
Total CHOL/HDL Ratio: 4
Triglycerides: 94 mg/dL (ref 0.0–149.0)
VLDL: 18.8 mg/dL (ref 0.0–40.0)

## 2018-08-05 MED ORDER — SYNJARDY XR 12.5-1000 MG PO TB24: 2.0000 | ORAL_TABLET | Freq: Every day | ORAL | 2 refills | Status: DC

## 2018-08-05 MED ORDER — TELMISARTAN 40 MG PO TABS: 40.0000 mg | ORAL_TABLET | Freq: Every day | ORAL | 0 refills | Status: DC

## 2018-08-05 NOTE — Progress Notes (Signed)
Patient ID: Tyler Benton, male   DOB: 09-Apr-1970, 48 y.o.   MRN: [id number]            Reason for Appointment: Follow-up for Type 2 Diabetes  Referring physician: Sanda Lingerhomas Jones  History of Present Illness:          Diagnosis: Type 2 diabetes mellitus, date of diagnosis: 2014       Past history: He had routine labs in 2014 which showed A1c of 6.6 and glucose of 158 fasting This was felt to be borderline and he was not started on any medication He was given Belviq for weight loss but he did not continue this after 2 weeks since he did not help and he found it expensive He was referred for evaluation because of poor blood sugar control in 8/15 and his blood sugar was significantly high with fasting glucose of 266 and A1c of 9.2 This was despite his trying to lose weight and he had lost 30 pounds along with exercising regularly His PCP had started treatment of his diabetes with a combination treatment using Victoza and Xigduo He was having poor control of his diabetes with A1c 9.2 in 2/17 because of noncompliance with on measures of self-care and medications and having symptomatic hyperglycemia  Recent history:  Non-insulin hypoglycemic drugs : Rybelsus 7 mg daily, Synjardy XR 12.06/998, 2 tablets daily   A1c previously done in 12/19 had gone up to 13% His A1c is now excellent at 5.7   Current management, blood sugar patterns and problems identified:  He was advised to start Synjardy XR instead of Xigduo which was denied by his insurance and he finally has been able to get this  However this is not on the list as it was apparently taken off by his PCP on the last visit  He thinks he is taking both tablets together regularly and had no difficulties with the prescription or side effects  Also no nausea with taking Rybelsus which he is doing consistently in the morning  He does feel that he is having more satiety with Rybelsus and mostly eating 1 meal in the evening  FASTING blood  sugars are persistently mildly increased although on an average better than the last visit  Since his blood sugars were coming down he was tapering his insulin dose down after his April visit  He has not taken any insulin for 1 month  He has been trying to do some walking for exercise since he is doing his close  He has done well with losing 14 pounds       Side effects from medications have been:  none   Glucose monitoring:  done once or twice a day       Glucometer: One touch Verio   Blood Glucose readings from monitor download show blood sugars as below   PRE-MEAL Fasting Lunch Dinner Bedtime Overall  Glucose range:  125-149   94-120  113-150   Mean/median:  136     130   Previous readings:  PRE-MEAL  midday Lunch Dinner Bedtime Overall  Glucose range:  117-155      Mean/median:  141     148   POST-MEAL PC Breakfast PC Lunch PC Dinner  Glucose range:  164  147  148, 166  Mean/median:        Self-care: He has been consistent with diet usually   Meals: 3 meals per day. Breakfast is protein shake or oatmeal, lunch is frequently salad or sandwich.  Dinner usually sandwich         Exercise:  3 times a week with walking/steps and weights  Dietician visit, most recent: 10/15            Weight history: was 380 in 2014  Wt Readings from Last 3 Encounters:  08/05/18 (!) 319 lb 6.4 oz (144.9 kg)  05/11/18 (!) 333 lb 12 oz (151.4 kg)  05/08/18 (!) 331 lb 3.2 oz (150.2 kg)    Glycemic control:   Lab Results  Component Value Date   HGBA1C 5.7 (A) 08/05/2018   HGBA1C 6.5 05/08/2018   HGBA1C 13.1 (A) 01/16/2018   Lab Results  Component Value Date   MICROALBUR <0.7 02/05/2018   LDLCALC 104 (H) 08/05/2018   CREATININE 0.93 08/05/2018   Office Visit on 08/05/2018  Component Date Value Ref Range Status  . Hemoglobin A1C 08/05/2018 5.7* 4.0 - 5.6 % Final  . Sodium 08/05/2018 141  135 - 145 mEq/L Final  . Potassium 08/05/2018 3.8  3.5 - 5.1 mEq/L Final  . Chloride  08/05/2018 106  96 - 112 mEq/L Final  . CO2 08/05/2018 28  19 - 32 mEq/L Final  . Glucose, Bld 08/05/2018 104* 70 - 99 mg/dL Final  . BUN 16/10/960407/02/2018 10  6 - 23 mg/dL Final  . Creatinine, Ser 08/05/2018 0.93  0.40 - 1.50 mg/dL Final  . Total Bilirubin 08/05/2018 0.9  0.2 - 1.2 mg/dL Final  . Alkaline Phosphatase 08/05/2018 47  39 - 117 U/L Final  . AST 08/05/2018 20  0 - 37 U/L Final  . ALT 08/05/2018 25  0 - 53 U/L Final  . Total Protein 08/05/2018 7.4  6.0 - 8.3 g/dL Final  . Albumin 54/09/811907/02/2018 4.3  3.5 - 5.2 g/dL Final  . Calcium 14/78/295607/02/2018 9.6  8.4 - 10.5 mg/dL Final  . GFR 21/30/865707/02/2018 105.77  >60.00 mL/min Final  . Cholesterol 08/05/2018 161  0 - 200 mg/dL Final   ATP III Classification       Desirable:  < 200 mg/dL               Borderline High:  200 - 239 mg/dL          High:  > = 846240 mg/dL  . Triglycerides 08/05/2018 94.0  0.0 - 149.0 mg/dL Final   Normal:  <962<150 mg/dLBorderline High:  150 - 199 mg/dL  . HDL 08/05/2018 38.40* >39.00 mg/dL Final  . VLDL 95/28/413207/02/2018 18.8  0.0 - 40.0 mg/dL Final  . LDL Cholesterol 08/05/2018 104* 0 - 99 mg/dL Final  . Total CHOL/HDL Ratio 08/05/2018 4   Final                  Men          Women1/2 Average Risk     3.4          3.3Average Risk          5.0          4.42X Average Risk          9.6          7.13X Average Risk          15.0          11.0                      . NonHDL 08/05/2018 122.60   Final   NOTE:  Non-HDL goal should be 30 mg/dL higher than  patient's LDL goal (i.e. LDL goal of < 70 mg/dL, would have non-HDL goal of < 100 mg/dL)    Other active problems: See review of systems   Allergies as of 08/05/2018   No Known Allergies     Medication List       Accurate as of August 05, 2018  4:28 PM. If you have any questions, ask your nurse or doctor.        STOP taking these medications   Insulin Lispro Prot & Lispro (75-25) 100 UNIT/ML Kwikpen Commonly known as: HumaLOG Mix 75/25 KwikPen Stopped by: Reather Littler, MD   telmisartan 40 MG  tablet Commonly known as: MICARDIS Stopped by: Reather Littler, MD     TAKE these medications   chlorthalidone 25 MG tablet Commonly known as: HYGROTON Take 0.5 tablets (12.5 mg total) by mouth daily. What changed:   how much to take  when to take this  additional instructions   glucose blood test strip Commonly known as: OneTouch Verio Use to test blood sugar 1 time a day as instructed.Dx code E11.65   Insulin Pen Needle 31G X 5 MM Misc Use on insulin pen 2x daily   levocetirizine 5 MG tablet Commonly known as: Xyzal Take 1 tablet (5 mg total) by mouth every evening.   NEOMYCIN-POLYMYXIN-HYDROCORTISONE 1 % Soln OTIC solution Commonly known as: CORTISPORIN Place 3 drops into both ears every 8 (eight) hours.   ONE TOUCH LANCETS Misc Use to test blood sugar once daily   rosuvastatin 20 MG tablet Commonly known as: CRESTOR Take 1 tablet (20 mg total) by mouth daily.   Semaglutide 7 MG Tabs Commonly known as: Rybelsus Take 1 tablet by mouth daily. Take 1 tablet by mouth once daily. DX:E11.65   Synjardy XR 12.06-998 MG Tb24 Generic drug: Empagliflozin-metFORMIN HCl ER Take 2 tablets by mouth daily with breakfast. Started by: Reather Littler, MD   triamcinolone 55 MCG/ACT Aero nasal inhaler Commonly known as: NASACORT Place 2 sprays into the nose daily.       Allergies: No Known Allergies  Past Medical History:  Diagnosis Date  . Diabetes mellitus without complication (HCC)   . GERD (gastroesophageal reflux disease)   . Obesity     Past Surgical History:  Procedure Laterality Date  . GROWTH PLATE SURGERY Bilateral 1980s  . WISDOM TOOTH EXTRACTION      Family History  Problem Relation Age of Onset  . Heart disease Mother   . Hypertension Mother   . Early death Neg Hx   . Alcohol abuse Neg Hx   . Birth defects Neg Hx   . Cancer Neg Hx   . COPD Neg Hx   . Drug abuse Neg Hx   . Hyperlipidemia Neg Hx   . Kidney disease Neg Hx   . Stroke Neg Hx   . Thyroid  disease Neg Hx   . Diabetes Maternal Grandfather     Social History:  reports that he has never smoked. He has never used smokeless tobacco. He reports that he does not drink alcohol or use drugs.    Review of Systems           Lipids: He has been prescribed Crestor 20 mg daily by his PCP This has been replacing Lipitor     Lab Results  Component Value Date   CHOL 161 08/05/2018   HDL 38.40 (L) 08/05/2018   LDLCALC 104 (H) 08/05/2018   LDLDIRECT 141.3 01/12/2014   TRIG 94.0 08/05/2018  CHOLHDL 4 08/05/2018                  Thyroid: He was found to have left-sided thyroid nodule on initial exam and this was benign on biopsy On exam he has had a 2.5-3 cm nodule on the left side  Lab Results  Component Value Date   TSH 1.59 05/11/2018    HYPERTENSION: Has been treated with Micardis 40 mg by his PCP Chlorthalidone was added by his PCP and he takes this every other day since he cannot cut this in half  His blood pressure appears to be consistently high in the office However blood pressure is higher today likely from his not taking his medication for 2 days  He has been checking blood pressure at work, yesterday reading 138/86 Previously readings have been about 126/80 at work, he works as an EMS   BP Readings from Last 3 Encounters:  08/05/18 (!) 148/100  05/11/18 (!) 132/100  05/08/18 140/90      LABS:  Office Visit on 08/05/2018  Component Date Value Ref Range Status  . Hemoglobin A1C 08/05/2018 5.7* 4.0 - 5.6 % Final  . Sodium 08/05/2018 141  135 - 145 mEq/L Final  . Potassium 08/05/2018 3.8  3.5 - 5.1 mEq/L Final  . Chloride 08/05/2018 106  96 - 112 mEq/L Final  . CO2 08/05/2018 28  19 - 32 mEq/L Final  . Glucose, Bld 08/05/2018 104* 70 - 99 mg/dL Final  . BUN 16/10/960407/02/2018 10  6 - 23 mg/dL Final  . Creatinine, Ser 08/05/2018 0.93  0.40 - 1.50 mg/dL Final  . Total Bilirubin 08/05/2018 0.9  0.2 - 1.2 mg/dL Final  . Alkaline Phosphatase 08/05/2018 47  39 -  117 U/L Final  . AST 08/05/2018 20  0 - 37 U/L Final  . ALT 08/05/2018 25  0 - 53 U/L Final  . Total Protein 08/05/2018 7.4  6.0 - 8.3 g/dL Final  . Albumin 54/09/811907/02/2018 4.3  3.5 - 5.2 g/dL Final  . Calcium 14/78/295607/02/2018 9.6  8.4 - 10.5 mg/dL Final  . GFR 21/30/865707/02/2018 105.77  >60.00 mL/min Final  . Cholesterol 08/05/2018 161  0 - 200 mg/dL Final   ATP III Classification       Desirable:  < 200 mg/dL               Borderline High:  200 - 239 mg/dL          High:  > = 846240 mg/dL  . Triglycerides 08/05/2018 94.0  0.0 - 149.0 mg/dL Final   Normal:  <962<150 mg/dLBorderline High:  150 - 199 mg/dL  . HDL 08/05/2018 38.40* >39.00 mg/dL Final  . VLDL 95/28/413207/02/2018 18.8  0.0 - 40.0 mg/dL Final  . LDL Cholesterol 08/05/2018 104* 0 - 99 mg/dL Final  . Total CHOL/HDL Ratio 08/05/2018 4   Final                  Men          Women1/2 Average Risk     3.4          3.3Average Risk          5.0          4.42X Average Risk          9.6          7.13X Average Risk          15.0  11.0                      . NonHDL 08/05/2018 122.60   Final   NOTE:  Non-HDL goal should be 30 mg/dL higher than patient's LDL goal (i.e. LDL goal of < 70 mg/dL, would have non-HDL goal of < 100 mg/dL)    Physical Examination:  BP (!) 148/100 (BP Location: Left Arm, Patient Position: Sitting, Cuff Size: Large)   Pulse 90   Ht 6' (1.829 m)   Wt (!) 319 lb 6.4 oz (144.9 kg)   SpO2 95%   BMI 43.32 kg/m      ASSESSMENT/PLAN:  Diabetes type 2 with morbid obesity  See history of present illness for detailed discussion of his current management, blood sugar patterns and problems identified  A1c is improved further at 5.7, previously 6.5  He has been able to taper off his insulin which was used to treat his glucose toxicity He is also finally starting to lose weight Likely has benefited more from adding Synjardy XR and he has no difficulty with the insurance currently He is trying to be active although can do better Overall diet  is well regulated and he is avoiding fried food and eating small meals  Discussed benefits of using SGLT2 drugs and GLP-1 drugs long-term Although he thinks he can continue to lose weight and wants to stop his medications eventually he would likely need to be on treatment long-term pharmacologically Also asked him to call if he has consistently higher readings in the mornings compared to recent months    HYPERTENSION: Blood pressure is usually high in the office However appears to be controlled at work when he checks his blood pressure He will continue follow-up with his PCP Currently needs to stay on Micardis and chlorthalidone  His medication list was updated and corrected  LIPIDS: He is now on Crestor and will do follow-up labs  Total visit time for evaluation and management of multiple problems and counseling =25 minutes   There are no Patient Instructions on file for this visit.   Elayne Snare 08/05/2018, 4:28 PM   Note: This office note was prepared with Dragon voice recognition system technology. Any transcriptional errors that result from this process are unintentional.

## 2018-08-06 ENCOUNTER — Ambulatory Visit: Payer: BLUE CROSS/BLUE SHIELD | Admitting: Endocrinology

## 2018-08-11 ENCOUNTER — Encounter: Payer: Self-pay | Admitting: Internal Medicine

## 2018-08-11 ENCOUNTER — Ambulatory Visit (INDEPENDENT_AMBULATORY_CARE_PROVIDER_SITE_OTHER): Payer: BC Managed Care – PPO | Admitting: Internal Medicine

## 2018-08-11 ENCOUNTER — Other Ambulatory Visit: Payer: Self-pay

## 2018-08-11 VITALS — BP 138/98 | HR 90 | Temp 98.1°F | Resp 16 | Ht 72.0 in | Wt 307.0 lb

## 2018-08-11 DIAGNOSIS — H02843 Edema of right eye, unspecified eyelid: Secondary | ICD-10-CM

## 2018-08-11 DIAGNOSIS — I10 Essential (primary) hypertension: Secondary | ICD-10-CM

## 2018-08-11 MED ORDER — CHLORTHALIDONE 25 MG PO TABS: 25.0000 mg | ORAL_TABLET | Freq: Every day | ORAL | 1 refills | Status: DC

## 2018-08-11 NOTE — Patient Instructions (Signed)

## 2018-08-11 NOTE — Progress Notes (Signed)
Subjective:  Patient ID: Tyler Benton, male    DOB: May 17, 1970  Age: 48 y.o. MRN: [id number]  CC: Hypertension   HPI Tyler Benton presents for f/up - He has been working on his lifestyle modifications with diet, exercise, and weight loss.  Unfortunately he struggles with compliance with the thiazide diuretic.  He occasionally misses doses.  He is concerned that his blood pressure is not been well controlled.  He denies any recent episodes of headache, blurred visions, CP, DOE, palpitations, edema, or fatigue.  He continues to complain that his right upper eyelid is swollen.  It does not bother him in any other way.  He has not seen an ophthalmologist about this.  Outpatient Medications Prior to Visit  Medication Sig Dispense Refill  . Empagliflozin-metFORMIN HCl ER (SYNJARDY XR) 12.06-998 MG TB24 Take 2 tablets by mouth daily with breakfast. 60 tablet 2  . glucose blood (ONETOUCH VERIO) test strip Use to test blood sugar 1 time a day as instructed.Dx code E11.65 100 each 4  . Insulin Pen Needle 31G X 5 MM MISC Use on insulin pen 2x daily 100 each 1  . levocetirizine (XYZAL) 5 MG tablet Take 1 tablet (5 mg total) by mouth every evening. 90 tablet 1  . NEOMYCIN-POLYMYXIN-HYDROCORTISONE (CORTISPORIN) 1 % SOLN OTIC solution Place 3 drops into both ears every 8 (eight) hours. 10 mL 1  . ONE TOUCH LANCETS MISC Use to test blood sugar once daily 200 each 1  . rosuvastatin (CRESTOR) 20 MG tablet Take 1 tablet (20 mg total) by mouth daily. 90 tablet 1  . Semaglutide (RYBELSUS) 7 MG TABS Take 1 tablet by mouth daily. Take 1 tablet by mouth once daily. DX:E11.65 30 tablet 11  . telmisartan (MICARDIS) 40 MG tablet Take 1 tablet (40 mg total) by mouth daily. 90 tablet 0  . triamcinolone (NASACORT) 55 MCG/ACT AERO nasal inhaler Place 2 sprays into the nose daily. 32.4 mL 3  . chlorthalidone (HYGROTON) 25 MG tablet Take 0.5 tablets (12.5 mg total) by mouth daily. (Patient taking differently: Take 25  mg by mouth every other day. Take 1 tablet by mouth every other day.) 45 tablet 1   No facility-administered medications prior to visit.     ROS Review of Systems  Constitutional: Negative.  Negative for diaphoresis, fatigue and unexpected weight change.  HENT: Negative.   Eyes: Negative for photophobia, pain, discharge, redness, itching and visual disturbance.  Respiratory: Negative.  Negative for cough, chest tightness, shortness of breath and wheezing.   Cardiovascular: Negative for chest pain, palpitations and leg swelling.  Gastrointestinal: Negative for abdominal pain, diarrhea, nausea and vomiting.  Endocrine: Negative.   Genitourinary: Negative.  Negative for difficulty urinating and hematuria.  Musculoskeletal: Negative.  Negative for arthralgias and myalgias.  Skin: Negative.  Negative for color change and pallor.  Neurological: Negative for dizziness, weakness, light-headedness and headaches.  Hematological: Negative for adenopathy. Does not bruise/bleed easily.  Psychiatric/Behavioral: Negative.     Objective:  BP (!) 138/98 (BP Location: Left Arm, Patient Position: Sitting, Cuff Size: Large) Comment: BP (R) 136/96 (L) 138/98  Pulse 90   Temp 98.1 F (36.7 C) (Oral)   Resp 16   Ht 6' (1.829 m)   Wt (!) 307 lb (139.3 kg)   SpO2 96%   BMI 41.64 kg/m   BP Readings from Last 3 Encounters:  08/11/18 (!) 138/98  08/05/18 (!) 148/100  05/11/18 (!) 132/100    Wt Readings from Last 3 Encounters:  08/11/18 (!) 307 lb (139.3 kg)  08/05/18 (!) 319 lb 6.4 oz (144.9 kg)  05/11/18 (!) 333 lb 12 oz (151.4 kg)    Physical Exam Vitals signs reviewed.  Constitutional:      Appearance: He is obese.  HENT:     Nose: Nose normal. No congestion or rhinorrhea.     Mouth/Throat:     Mouth: Mucous membranes are moist.     Pharynx: No oropharyngeal exudate.  Eyes:     General: No scleral icterus.       Right eye: No discharge or hordeolum.        Left eye: No discharge or  hordeolum.     Conjunctiva/sclera: Conjunctivae normal.     Right eye: Right conjunctiva is not injected. No chemosis, exudate or hemorrhage.    Left eye: Left conjunctiva is not injected. No chemosis, exudate or hemorrhage.    Comments: Right upper eyelid is mildly swollen but there is no tenderness, mass, cyst, erythema, or exudate.  Neck:     Musculoskeletal: Normal range of motion. No neck rigidity or muscular tenderness.  Cardiovascular:     Rate and Rhythm: Normal rate and regular rhythm.     Heart sounds: No murmur.  Pulmonary:     Effort: Pulmonary effort is normal.     Breath sounds: No stridor. No wheezing, rhonchi or rales.  Abdominal:     General: Abdomen is protuberant. There is no distension.     Palpations: There is no hepatomegaly or splenomegaly.     Tenderness: There is no abdominal tenderness.  Musculoskeletal: Normal range of motion.     Right lower leg: No edema.     Left lower leg: No edema.  Lymphadenopathy:     Cervical: No cervical adenopathy.  Skin:    General: Skin is warm and dry.     Coloration: Skin is not pale.  Neurological:     General: No focal deficit present.     Mental Status: He is oriented to person, place, and time. Mental status is at baseline.  Psychiatric:        Mood and Affect: Mood normal.     Lab Results  Component Value Date   WBC 5.3 11/20/2016   HGB 15.2 11/20/2016   HCT 46.0 11/20/2016   PLT 268.0 11/20/2016   GLUCOSE 104 (H) 08/05/2018   CHOL 161 08/05/2018   TRIG 94.0 08/05/2018   HDL 38.40 (L) 08/05/2018   LDLDIRECT 141.3 01/12/2014   LDLCALC 104 (H) 08/05/2018   ALT 25 08/05/2018   AST 20 08/05/2018   NA 141 08/05/2018   K 3.8 08/05/2018   CL 106 08/05/2018   CREATININE 0.93 08/05/2018   BUN 10 08/05/2018   CO2 28 08/05/2018   TSH 1.59 05/11/2018   PSA 0.45 05/11/2018   HGBA1C 5.7 (A) 08/05/2018   MICROALBUR <0.7 02/05/2018    Dg Lumbar Spine Complete  Result Date: 02/12/2017 CLINICAL DATA:  Chronic low  back pain, worse with standing. EXAM: LUMBAR SPINE - COMPLETE 4+ VIEW COMPARISON:  None. FINDINGS: Five lumbar type vertebral bodies show normal alignment. Mild disc space narrowing L4-5. Mild lower lumbar facet arthritis. No pars defect or slippage. Sacroiliac joints appear normal. IMPRESSION: Mild disc space narrowing L4-5.  No acute finding. Electronically Signed   By: Paulina FusiMark  Shogry M.D.   On: 02/12/2017 15:20   Dg Shoulder Right  Result Date: 02/12/2017 CLINICAL DATA:  Shoulder pain after injury 4 months ago. EXAM: RIGHT SHOULDER - 2+  VIEW COMPARISON:  None. FINDINGS: Glenohumeral joint appears normal. Narrowing of the humeral acromial distance suggesting rotator cuff pathology. Greater tuberosity osteophyte formation. AC joint degenerative change. IMPRESSION: No acute or traumatic finding. Narrowing of the humeral acromial distance. Greater tuberosity osteophyte formation. AC joint osteoarthritis. Electronically Signed   By: Paulina FusiMark  Shogry M.D.   On: 02/12/2017 15:21   [place] Limited Joint Space Structures Up Right  Result Date: 02/22/2017 [facility] US performed of: Right This study was ordered, performed, and interpreted by Terrilee FilesZach Smith D.O.  Shoulder:  Supraspinatus: Articular sided tear noted with what appears to be a very small Hill-Sachs deformity  bursal bulge seen with shoulder abduction on impingement view. Subscapularis:  Appears normal on long and transverse views. Positive bursa Teres Minor:  Appears normal on long and transverse views. AC joint: Arthritic changes Glenohumeral Joint:  Appears normal without effusion. Glenoid Labrum:  Intact without visualized tears. Biceps Tendon:  Appears normal on long and transverse views, no fraying of tendon, tendon located in intertubercular groove, no subluxation with shoulder internal or external rotation.  Impression: Partial rotator cuff tear   Assessment & Plan:   Tyler Benton was seen today for hypertension.  Diagnoses and all orders for this visit:   Swelling of eyelid, right -     Ambulatory referral to Ophthalmology  Essential hypertension- His blood pressure is not adequately well controlled.  In addition to lifestyle modifications, I have asked him to increase his chlorthalidone dose to 25 mg a day.  He will stay on the current dose of telmisartan.  He will continue to work on his lifestyle modifications. -     chlorthalidone (HYGROTON) 25 MG tablet; Take 1 tablet (25 mg total) by mouth daily.   I have changed Nyquan L. Delange's chlorthalidone. I am also having him maintain his ONE TOUCH LANCETS, Semaglutide, Insulin Pen Needle, glucose blood, rosuvastatin, levocetirizine, triamcinolone, NEOMYCIN-POLYMYXIN-HYDROCORTISONE, Synjardy XR, and telmisartan.  Meds ordered this encounter  Medications  . chlorthalidone (HYGROTON) 25 MG tablet    Sig: Take 1 tablet (25 mg total) by mouth daily.    Dispense:  90 tablet    Refill:  1     Follow-up: Return in about 4 months (around 12/12/2018).  Sanda Lingerhomas Jones, MD

## 2018-08-22 ENCOUNTER — Other Ambulatory Visit: Payer: Self-pay | Admitting: Endocrinology

## 2018-08-28 LAB — HM DIABETES EYE EXAM

## 2018-10-28 ENCOUNTER — Other Ambulatory Visit: Payer: Self-pay

## 2018-10-28 ENCOUNTER — Telehealth: Payer: Self-pay | Admitting: Endocrinology

## 2018-10-28 MED ORDER — ONETOUCH VERIO VI STRP: ORAL_STRIP | 4 refills | Status: DC

## 2018-10-28 NOTE — Telephone Encounter (Signed)
MEDICATION: Verio Onetouch Test Strips  PHARMACY:  [facility]   IS THIS A 90 DAY SUPPLY :    IS PATIENT OUT OF MEDICATION: yes  IF NOT; HOW MUCH IS LEFT:   LAST APPOINTMENT DATE: @7 /18/2020  NEXT APPOINTMENT DATE:@10 /02/2018  DO WE HAVE YOUR PERMISSION TO LEAVE A DETAILED MESSAGE: yes  OTHER COMMENTS: per patient current RX needs t be resent with the new testing guidelines.  Current RX is for testing 1x per day and per Dr Dwyane Dee he is testing 3x per day   **Let patient know to contact pharmacy at the end of the day to make sure medication is ready. **  ** Please notify patient to allow 48-72 hours to process**  **Encourage patient to contact the pharmacy for refills or they can request refills through [facility]**

## 2018-10-28 NOTE — Telephone Encounter (Signed)
Rx sent 

## 2018-11-04 ENCOUNTER — Other Ambulatory Visit: Payer: Self-pay

## 2018-11-05 ENCOUNTER — Ambulatory Visit: Payer: BC Managed Care – PPO | Admitting: Endocrinology

## 2018-11-05 ENCOUNTER — Encounter: Payer: Self-pay | Admitting: Endocrinology

## 2018-11-05 ENCOUNTER — Other Ambulatory Visit: Payer: Self-pay

## 2018-11-05 VITALS — BP 124/88 | HR 84 | Temp 97.8°F | Wt 304.2 lb

## 2018-11-05 DIAGNOSIS — E785 Hyperlipidemia, unspecified: Secondary | ICD-10-CM | POA: Diagnosis not present

## 2018-11-05 DIAGNOSIS — I10 Essential (primary) hypertension: Secondary | ICD-10-CM

## 2018-11-05 DIAGNOSIS — E1165 Type 2 diabetes mellitus with hyperglycemia: Secondary | ICD-10-CM | POA: Diagnosis not present

## 2018-11-05 DIAGNOSIS — Z23 Encounter for immunization: Secondary | ICD-10-CM | POA: Diagnosis not present

## 2018-11-05 LAB — BASIC METABOLIC PANEL
BUN: 17 mg/dL (ref 6–23)
CO2: 28 mEq/L (ref 19–32)
Calcium: 10.1 mg/dL (ref 8.4–10.5)
Chloride: 99 mEq/L (ref 96–112)
Creatinine, Ser: 0.98 mg/dL (ref 0.40–1.50)
GFR: 99.46 mL/min (ref >60.00)
Glucose, Bld: 101 mg/dL — ABNORMAL HIGH (ref 70–99)
Potassium: 3.9 mEq/L (ref 3.5–5.1)
Sodium: 136 mEq/L (ref 135–145)

## 2018-11-05 LAB — POCT GLYCOSYLATED HEMOGLOBIN (HGB A1C): Hemoglobin A1C: 5.8 % — AB (ref 4.0–5.6)

## 2018-11-05 LAB — MICROALBUMIN / CREATININE URINE RATIO
Creatinine,U: 87.8 mg/dL
Microalb Creat Ratio: 0.8 mg/g (ref 0.0–30.0)
Microalb, Ur: <0.7 mg/dL (ref 0.0–1.9)

## 2018-11-05 NOTE — Progress Notes (Signed)
Patient ID: Tyler Benton, male   DOB: [birth year], 48 y.o.   MRN: [id number]            Reason for Appointment: Follow-up for Type 2 Diabetes  Referring physician: Sanda Lingerhomas Jones  History of Present Illness:          Diagnosis: Type 2 diabetes mellitus, date of diagnosis: 2014       Past history: He had routine labs in 2014 which showed A1c of 6.6 and glucose of 158 fasting This was felt to be borderline and he was not started on any medication He was given Belviq for weight loss but he did not continue this after 2 weeks since he did not help and he found it expensive He was referred for evaluation because of poor blood sugar control in 8/15 and his blood sugar was significantly high with fasting glucose of 266 and A1c of 9.2 This was despite his trying to lose weight and he had lost 30 pounds along with exercising regularly His PCP had started treatment of his diabetes with a combination treatment using Victoza and Xigduo He was having poor control of his diabetes with A1c 9.2 in 2/17 because of noncompliance with on measures of self-care and medications and having symptomatic hyperglycemia  Recent history:  Non-insulin hypoglycemic drugs : Rybelsus 7 mg daily, Synjardy XR 12.06/998, 2 tablets daily   A1c previously done in 12/19 had gone up to 13%  His A1c is now consistently good at 5.8, previously was at 5.7   Current management, blood sugar patterns and problems identified:  He was off insulin on his last visit partly because he had run out of the medication but since his blood sugars have been looking fairly good he was told not to continue this  However he now says that if his blood sugars are over 130 he will take 30 units of premixed insulin  His blood sugars are being checked mostly before his first meal which is usually midday and very few readings later in the day  He will have occasional readings that are high in the morning, twice over 200  Not clear if this is  high the night before  He is usually trying to cut back on carbohydrates but occasionally with the higher carbohydrate meal he will may have a higher blood sugar the next morning  No side effects with Synjardy and he is taking this regularly  No hypoglycemia reported with insulin  He has started going to the gym for exercise almost every day      Side effects from medications have been:  none   Glucose monitoring:  done once or twice a day       Glucometer: One touch Verio   Blood Glucose readings from monitor download show blood sugars as below   PRE-MEAL Fasting Lunch Dinner Bedtime Overall  Glucose range:  97-216  104, 140  103-138    Mean/median:        Previous readings:  PRE-MEAL Fasting Lunch Dinner Bedtime Overall  Glucose range:  125-149   94-120  113-150   Mean/median:  136     130      Self-care: He has been consistent with diet usually   Meals: 3 meals per day. Breakfast is protein shake or oatmeal, lunch is frequently salad or sandwich. Dinner usually sandwich         Exercise:  3 times a week with walking/steps and weights  Dietician visit, most recent: 10/15  Weight history: was 380 in 2014  Wt Readings from Last 3 Encounters:  11/05/18 (!) 304 lb 3.2 oz (138 kg)  08/11/18 (!) 307 lb (139.3 kg)  08/05/18 (!) 319 lb 6.4 oz (144.9 kg)    Glycemic control:   Lab Results  Component Value Date   HGBA1C 5.8 (A) 11/05/2018   HGBA1C 5.7 (A) 08/05/2018   HGBA1C 6.5 05/08/2018   Lab Results  Component Value Date   MICROALBUR <0.7 02/05/2018   LDLCALC 104 (H) 08/05/2018   CREATININE 0.93 08/05/2018   Office Visit on 11/05/2018  Component Date Value Ref Range Status  . Hemoglobin A1C 11/05/2018 5.8* 4.0 - 5.6 % Final    Other active problems: See review of systems   Allergies as of 11/05/2018   No Known Allergies     Medication List       Accurate as of November 05, 2018 11:55 AM. If you have any questions, ask your nurse or  doctor.        chlorthalidone 25 MG tablet Commonly known as: HYGROTON Take 1 tablet (25 mg total) by mouth daily.   Insulin Pen Needle 31G X 5 MM Misc Use on insulin pen 2x daily   levocetirizine 5 MG tablet Commonly known as: Xyzal Take 1 tablet (5 mg total) by mouth every evening.   NEOMYCIN-POLYMYXIN-HYDROCORTISONE 1 % Soln OTIC solution Commonly known as: CORTISPORIN Place 3 drops into both ears every 8 (eight) hours.   ONE TOUCH LANCETS Misc Use to test blood sugar once daily   OneTouch Verio test strip Generic drug: glucose blood Use to test blood sugar 3 times a day as instructed.Dx code E11.65   rosuvastatin 20 MG tablet Commonly known as: CRESTOR Take 1 tablet (20 mg total) by mouth daily.   Semaglutide 7 MG Tabs Commonly known as: Rybelsus Take 1 tablet by mouth daily. Take 1 tablet by mouth once daily. DX:E11.65   Synjardy XR 12.06-998 MG Tb24 Generic drug: Empagliflozin-metFORMIN HCl ER TAKE 2 TABLETS BY MOUTH DAILY WITH BREAKFAST   telmisartan 40 MG tablet Commonly known as: MICARDIS Take 1 tablet (40 mg total) by mouth daily.   triamcinolone 55 MCG/ACT Aero nasal inhaler Commonly known as: NASACORT Place 2 sprays into the nose daily.       Allergies: No Known Allergies  Past Medical History:  Diagnosis Date  . Diabetes mellitus without complication ([place])   . GERD (gastroesophageal reflux disease)   . Obesity     Past Surgical History:  Procedure Laterality Date  . GROWTH PLATE SURGERY Bilateral 1980s  . WISDOM TOOTH EXTRACTION      Family History  Problem Relation Age of Onset  . Heart disease Mother   . Hypertension Mother   . Early death Neg Hx   . Alcohol abuse Neg Hx   . Birth defects Neg Hx   . Cancer Neg Hx   . COPD Neg Hx   . Drug abuse Neg Hx   . Hyperlipidemia Neg Hx   . Kidney disease Neg Hx   . Stroke Neg Hx   . Thyroid disease Neg Hx   . Diabetes Maternal Grandfather     Social History:  reports that he has  never smoked. He has never used smokeless tobacco. He reports that he does not drink alcohol or use drugs.    Review of Systems         Lipids: He has been prescribed Crestor 20 mg daily by his PCP  Lab Results  Component Value Date   CHOL 161 08/05/2018   HDL 38.40 (L) 08/05/2018   LDLCALC 104 (H) 08/05/2018   LDLDIRECT 141.3 01/12/2014   TRIG 94.0 08/05/2018   CHOLHDL 4 08/05/2018                  Thyroid: He was found to have left-sided thyroid nodule on initial exam and this was benign on biopsy On exam he has had a 2.5-3 cm nodule on the left side  Lab Results  Component Value Date   TSH 1.59 05/11/2018    HYPERTENSION: Has been treated with Micardis 40 mg by his PCP Chlorthalidone was added by his PCP and he takes this every day since he cannot cut this in half  His blood pressure appears to be consistently high in the office  He has been checking blood pressure at work  130/82   BP Readings from Last 3 Encounters:  11/05/18 124/88  08/11/18 (!) 138/98  08/05/18 (!) 148/100      LABS:  Office Visit on 11/05/2018  Component Date Value Ref Range Status  . Hemoglobin A1C 11/05/2018 5.8* 4.0 - 5.6 % Final    Physical Examination:  BP 124/88   Pulse 84   Temp 97.8 F (36.6 C)   Wt (!) 304 lb 3.2 oz (138 kg)   SpO2 97%   BMI 41.26 kg/m      ASSESSMENT/PLAN:  Diabetes type 2 with morbid obesity  See history of present illness for detailed discussion of his current management, blood sugar patterns and problems identified  A1c is stable at 5.8  He has done well with weight loss, exercise and cutting back on portions and carbohydrate Although he is still taking insulin most of the time in the morning to correct his high fasting readings at times this is unlikely to be needed at this time since his A1c is relatively low and he is not insulin deficient  He will stop his insulin and start watching his blood sugars after meals consistently  every day He will also increase his Rybelsus to 14 instead of 7 mg to help postprandial hyperglycemia    HYPERTENSION: Blood pressure is usually high in the office Blood pressure fairly good at work Needs follow-up microalbumin  He will continue to follow-up with PCP  LIPIDS: He is getting good control on Crestor although LDL just above 100 on the last visit We will recheck today  Flu vaccine given   Patient Instructions  Check blood sugars on waking up 5 days a week  Also check blood sugars about 2 hours after meals and do this after different meals by rotation  Recommended blood sugar levels on waking up are 90-130 and about 2 hours after meal is 130-160  Please bring your blood sugar monitor to each visit, thank you  Take 14mg  Rybelsus  No Insulin    11/05/2018, 11:55 AM   Note: This office note was prepared with Dragon voice recognition system technology. Any transcriptional errors that result from this process are unintentional.

## 2018-11-05 NOTE — Patient Instructions (Addendum)
Check blood sugars on waking up 5 days a week  Also check blood sugars about 2 hours after meals and do this after different meals by rotation  Recommended blood sugar levels on waking up are 90-130 and about 2 hours after meal is 130-160  Please bring your blood sugar monitor to each visit, thank you  Take 14mg  Rybelsus  No Insulin

## 2018-11-08 NOTE — Progress Notes (Signed)
Please call to let patient know that the lab results are normal and no further action needed

## 2018-11-09 ENCOUNTER — Other Ambulatory Visit: Payer: Self-pay | Admitting: Internal Medicine

## 2018-11-09 DIAGNOSIS — E785 Hyperlipidemia, unspecified: Secondary | ICD-10-CM

## 2018-11-09 MED ORDER — ROSUVASTATIN CALCIUM 20 MG PO TABS: 20.0000 mg | ORAL_TABLET | Freq: Every day | ORAL | 0 refills | Status: DC

## 2018-11-18 ENCOUNTER — Telehealth: Payer: Self-pay | Admitting: Endocrinology

## 2018-11-18 ENCOUNTER — Other Ambulatory Visit: Payer: Self-pay

## 2018-11-18 MED ORDER — RYBELSUS 14 MG PO TABS: 1.0000 | ORAL_TABLET | Freq: Every day | ORAL | 3 refills | Status: DC

## 2018-11-18 NOTE — Telephone Encounter (Signed)
MEDICATION: Rybelsus 14 MG tablets  PHARMACY:  [facility] [address] in [place] :   IS PATIENT OUT OF MEDICATION: yes  IF NOT; HOW MUCH IS LEFT:   LAST APPOINTMENT DATE: @10 /02/2018  NEXT APPOINTMENT DATE:@1 /06/2019  DO WE HAVE YOUR PERMISSION TO LEAVE A DETAILED MESSAGE: yes [phone]  OTHER COMMENTS:    **Let patient know to contact pharmacy at the end of the day to make sure medication is ready. **  ** Please notify patient to allow 48-72 hours to process**  **Encourage patient to contact the pharmacy for refills or they can request refills through [facility]**

## 2018-11-18 NOTE — Telephone Encounter (Signed)
Rx has been sent  

## 2018-11-24 ENCOUNTER — Other Ambulatory Visit: Payer: Self-pay | Admitting: Endocrinology

## 2018-12-14 ENCOUNTER — Ambulatory Visit: Payer: BC Managed Care – PPO | Admitting: Internal Medicine

## 2019-02-09 ENCOUNTER — Ambulatory Visit (INDEPENDENT_AMBULATORY_CARE_PROVIDER_SITE_OTHER): Payer: BC Managed Care – PPO | Admitting: Endocrinology

## 2019-02-09 ENCOUNTER — Encounter: Payer: Self-pay | Admitting: Endocrinology

## 2019-02-09 ENCOUNTER — Other Ambulatory Visit: Payer: Self-pay

## 2019-02-09 VITALS — BP 134/82 | HR 94 | Ht 72.0 in | Wt 295.6 lb

## 2019-02-09 DIAGNOSIS — E782 Mixed hyperlipidemia: Secondary | ICD-10-CM | POA: Diagnosis not present

## 2019-02-09 DIAGNOSIS — E1165 Type 2 diabetes mellitus with hyperglycemia: Secondary | ICD-10-CM | POA: Diagnosis not present

## 2019-02-09 DIAGNOSIS — I10 Essential (primary) hypertension: Secondary | ICD-10-CM

## 2019-02-09 LAB — COMPREHENSIVE METABOLIC PANEL
ALT: 23 U/L (ref 0–53)
AST: 23 U/L (ref 0–37)
Albumin: 4.3 g/dL (ref 3.5–5.2)
Alkaline Phosphatase: 58 U/L (ref 39–117)
BUN: 14 mg/dL (ref 6–23)
CO2: 27 mEq/L (ref 19–32)
Calcium: 10.1 mg/dL (ref 8.4–10.5)
Chloride: 99 mEq/L (ref 96–112)
Creatinine, Ser: 0.96 mg/dL (ref 0.40–1.50)
GFR: 101.74 mL/min (ref >60.00)
Glucose, Bld: 111 mg/dL — ABNORMAL HIGH (ref 70–99)
Potassium: 3.7 mEq/L (ref 3.5–5.1)
Sodium: 137 mEq/L (ref 135–145)
Total Bilirubin: 1.1 mg/dL (ref 0.2–1.2)
Total Protein: 7.8 g/dL (ref 6.0–8.3)

## 2019-02-09 LAB — POCT GLYCOSYLATED HEMOGLOBIN (HGB A1C): Hemoglobin A1C: 5.5 % (ref 4.0–5.6)

## 2019-02-09 LAB — LIPID PANEL
Cholesterol: 126 mg/dL (ref 0–200)
HDL: 38.2 mg/dL — ABNORMAL LOW (ref >39.00)
LDL Cholesterol: 65 mg/dL (ref 0–99)
NonHDL: 87.84
Total CHOL/HDL Ratio: 3
Triglycerides: 114 mg/dL (ref 0.0–149.0)
VLDL: 22.8 mg/dL (ref 0.0–40.0)

## 2019-02-09 MED ORDER — INSULIN LISPRO PROT & LISPRO (75-25 MIX) 100 UNIT/ML KWIKPEN: 30.0000 [IU] | PEN_INJECTOR | Freq: Every day | SUBCUTANEOUS | 1 refills | Status: DC

## 2019-02-09 NOTE — Addendum Note (Signed)
Addended by: Adline Mango I on: 02/09/2019 11:20 AM   Modules accepted: Orders

## 2019-02-09 NOTE — Patient Instructions (Addendum)
20 Units before supper daily  Check blood sugars on waking up days a week  Also check blood sugars about 2 hours after meals and do this after different meals by rotation  Recommended blood sugar levels on waking up are 80-130 and about 2 hours after meal is 130-160  Please bring your blood sugar monitor to each visit, thank you

## 2019-02-09 NOTE — Progress Notes (Signed)
Patient ID: Tyler Benton, male   DOB: [birth year], 49 y.o.   MRN: [id number]            Reason for Appointment: Follow-up for Type 2 Diabetes  Referring physician: Sanda Linger  History of Present Illness:          Diagnosis: Type 2 diabetes mellitus, date of diagnosis: 2014       Past history: He had routine labs in 2014 which showed A1c of 6.6 and glucose of 158 fasting This was felt to be borderline and he was not started on any medication He was given Belviq for weight loss but he did not continue this after 2 weeks since he did not help and he found it expensive He was referred for evaluation because of poor blood sugar control in 8/15 and his blood sugar was significantly high with fasting glucose of 266 and A1c of 9.2 This was despite his trying to lose weight and he had lost 30 pounds along with exercising regularly His PCP had started treatment of his diabetes with a combination treatment using Victoza and Xigduo He was having poor control of his diabetes with A1c 9.2 in 2/17 because of noncompliance with on measures of self-care and medications and having symptomatic hyperglycemia  A1c done in 12/19 had gone up to 13%  Recent history:   Insulin regimen: Humalog 75/25, 32 units after lunch  Non-insulin hypoglycemic drugs : Rybelsus 14 mg daily, Synjardy XR 12.06/998, 2 tablets daily  His A1c is now consistently in the normal range and now 5.5 compared to 5.8   Current management, blood sugar patterns and problems identified:  He was reporting readings occasionally over 200 when he was told to stop his insulin in October last year.  Also he thinks he felt more tired and thirsty when he was not taking insulin even though his blood sugars were not consistently high  However off insulin after his visit he went back on his insulin on his own and is arbitrarily taking 32 units  However he takes this sometimes in the early afternoon after lunch  Most of his blood sugars are  being done midday  Not clear if fasting readings are consistently around 120, he has only a couple of readings  Lowest blood sugar is 89 in the afternoon  However blood sugars are otherwise fairly good throughout the day  His Rybelsus was increased to 14 mg on the last visit  With better diet and exercise he appears to be losing weight  He has done only occasional readings after meals and highest reading is 164  No hypoglycemia reported with insulin  He has done a lot of walking for exercise now      Side effects from medications have been:  none  His dinnertime is quite variable in the evenings  Glucose monitoring:  done once or twice a day       Glucometer: One touch Verio   Blood Glucose readings from monitor download show blood sugars as below   PRE-MEAL Fasting  midday Dinner  overnight Overall  Glucose range:  121, 121  105-134  102  101-164   Mean/median:   125    121   POST-MEAL PC Breakfast PC Lunch PC Dinner  Glucose range:   89   Mean/median:      PREVIOUS readings:  PRE-MEAL Fasting Lunch Dinner Bedtime Overall  Glucose range:  97-216  104, 140  103-138    Mean/median:  Self-care: He has been consistent with diet usually   Meals: 3 meals per day. Breakfast is protein shake or oatmeal, lunch is frequently salad or sandwich. Dinner usually sandwich         Exercise:  60 min walking/steps    Dietician visit, most recent: 10/15            Weight history: was 380 in 2014  Wt Readings from Last 3 Encounters:  02/09/19 295 lb 9.6 oz (134.1 kg)  11/05/18 (!) 304 lb 3.2 oz (138 kg)  08/11/18 (!) 307 lb (139.3 kg)    Glycemic control:   Lab Results  Component Value Date   HGBA1C 5.5 02/09/2019   HGBA1C 5.8 (A) 11/05/2018   HGBA1C 5.7 (A) 08/05/2018   Lab Results  Component Value Date   MICROALBUR <0.7 11/05/2018   LDLCALC 104 (H) 08/05/2018   CREATININE 0.98 11/05/2018   Office Visit on 02/09/2019  Component Date Value Ref Range  Status  . Hemoglobin A1C 02/09/2019 5.5  4.0 - 5.6 % Final    Other active problems: See review of systems   Allergies as of 02/09/2019   No Known Allergies     Medication List       Accurate as of February 09, 2019 10:37 AM. If you have any questions, ask your nurse or doctor.        STOP taking these medications   telmisartan 40 MG tablet Commonly known as: MICARDIS Stopped by: Reather Littler, MD     TAKE these medications   chlorthalidone 25 MG tablet Commonly known as: HYGROTON Take 1 tablet (25 mg total) by mouth daily.   HumaLOG Mix 75/25 KwikPen (75-25) 100 UNIT/ML Kwikpen Generic drug: Insulin Lispro Prot & Lispro Inject 32 Units into the skin 3 (three) times daily as needed. Inject 32 units under the skin three times daily before meals if blood sugar is over 130.   Insulin Pen Needle 31G X 5 MM Misc Use on insulin pen 2x daily   levocetirizine 5 MG tablet Commonly known as: Xyzal Take 1 tablet (5 mg total) by mouth every evening.   NEOMYCIN-POLYMYXIN-HYDROCORTISONE 1 % Soln OTIC solution Commonly known as: CORTISPORIN Place 3 drops into both ears every 8 (eight) hours.   ONE TOUCH LANCETS Misc Use to test blood sugar once daily   OneTouch Verio test strip Generic drug: glucose blood Use to test blood sugar 3 times a day as instructed.Dx code E11.65   rosuvastatin 20 MG tablet Commonly known as: CRESTOR Take 1 tablet (20 mg total) by mouth daily.   Rybelsus 14 MG Tabs Generic drug: Semaglutide Take 1 tablet by mouth daily. Take 1 tablet by mouth once daily.   Synjardy XR 12.06-998 MG Tb24 Generic drug: Empagliflozin-metFORMIN HCl ER TAKE 2 TABLETS BY MOUTH DAILY WITH BREAKFAST   triamcinolone 55 MCG/ACT Aero nasal inhaler Commonly known as: NASACORT Place 2 sprays into the nose daily.       Allergies: No Known Allergies  Past Medical History:  Diagnosis Date  . Diabetes mellitus without complication ([facility])   . GERD (gastroesophageal reflux  disease)   . Obesity     Past Surgical History:  Procedure Laterality Date  . GROWTH PLATE SURGERY Bilateral 1980s  . WISDOM TOOTH EXTRACTION      Family History  Problem Relation Age of Onset  . Heart disease Mother   . Hypertension Mother   . Early death Neg Hx   . Alcohol abuse Neg Hx   .  Birth defects Neg Hx   . Cancer Neg Hx   . COPD Neg Hx   . Drug abuse Neg Hx   . Hyperlipidemia Neg Hx   . Kidney disease Neg Hx   . Stroke Neg Hx   . Thyroid disease Neg Hx   . Diabetes Maternal Grandfather     Social History:  reports that he has never smoked. He has never used smokeless tobacco. He reports that he does not drink alcohol or use drugs.    Review of Systems         Lipids: He has been prescribed Crestor 20 mg daily by his PCP but recent labs not available       Lab Results  Component Value Date   CHOL 161 08/05/2018   HDL 38.40 (L) 08/05/2018   LDLCALC 104 (H) 08/05/2018   LDLDIRECT 141.3 01/12/2014   TRIG 94.0 08/05/2018   CHOLHDL 4 08/05/2018                  Thyroid: He was found to have left-sided thyroid nodule on initial exam and this was benign on biopsy On exam he has had a 2.5-3 cm nodule on the left side  Lab Results  Component Value Date   TSH 1.59 05/11/2018    HYPERTENSION: was been treated with Micardis 40 mg by his PCP previously Chlorthalidone was added by his PCP and not clear if this is the only medication he is taking now   He has been checking blood pressure at work, usually range 128-130/76-84    BP Readings from Last 3 Encounters:  02/09/19 134/82  11/05/18 124/88  08/11/18 (!) 138/98      LABS:  Office Visit on 02/09/2019  Component Date Value Ref Range Status  . Hemoglobin A1C 02/09/2019 5.5  4.0 - 5.6 % Final    Physical Examination:  BP 134/82 (BP Location: Left Arm, Patient Position: Sitting, Cuff Size: Large)   Pulse 94   Ht 6' (1.829 m)   Wt 295 lb 9.6 oz (134.1 kg)   SpO2 96%   BMI 40.09 kg/m       ASSESSMENT/PLAN:  Diabetes type 2 with morbid obesity  See history of present illness for detailed discussion of his current management, blood sugar patterns and problems identified  A1c is stable at 5.5  A1c appears higher than expected, recent home blood sugar average 121 but most readings are being done midday and not after meals Although he is likely does not need insulin he says he has better readings with taking this and no hypoglycemia However he is taking the insulin at 1 or 2 PM and not with his main meal in the evenings Currently losing weight and benefiting from increased Rybelsus Discussed benefits of Rybelsus and Jardiance long-term  Recommendations:  Check more blood sugars after meals  Make sure he takes his insulin only before dinnertime and reduce the dose to 20 units  He will increase the dose only if he sees consistently high readings and the target range was given to him on his printout for both fasting and after meals  Continue healthy diet and regular walking  No change in medications  Need to check chemistry panel today with his staying on Synjardy      HYPERTENSION: Blood pressure is relatively better Blood pressure fairly good checked at work   Unclear what medication he is taking currently He will continue to follow-up with PCP  LIPIDS: He is due for follow-up  and we will draw his labs today since he has not had a scheduled follow-up with his PCP  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  Discussed how the vaccine works, benefits, possible side effects and current safety and efficacy information. Highly recommended that he take the vaccine because of his occupation as well as high risk status and he will consider this.   Patient Instructions  20 Units before supper daily  Check blood sugars on waking up days a week  Also check blood sugars about 2 hours after meals and do this after different  meals by rotation  Recommended blood sugar levels on waking up are 80-130 and about 2 hours after meal is 130-160  Please bring your blood sugar monitor to each visit, thank you      Reather Littler 02/09/2019, 10:37 AM   Note: This office note was prepared with Dragon voice recognition system technology. Any transcriptional errors that result from this process are unintentional.

## 2019-02-10 NOTE — Progress Notes (Signed)
Please call to let patient know that the lab results including kidney function and cholesterol are normal and no change needed

## 2019-02-12 ENCOUNTER — Other Ambulatory Visit: Payer: Self-pay | Admitting: Internal Medicine

## 2019-02-12 DIAGNOSIS — E785 Hyperlipidemia, unspecified: Secondary | ICD-10-CM

## 2019-02-12 DIAGNOSIS — I10 Essential (primary) hypertension: Secondary | ICD-10-CM

## 2019-02-28 ENCOUNTER — Other Ambulatory Visit: Payer: Self-pay | Admitting: Endocrinology

## 2019-03-02 ENCOUNTER — Other Ambulatory Visit: Payer: Self-pay | Admitting: Endocrinology

## 2019-03-30 ENCOUNTER — Other Ambulatory Visit: Payer: Self-pay | Admitting: Endocrinology

## 2019-05-16 ENCOUNTER — Other Ambulatory Visit: Payer: Self-pay | Admitting: Internal Medicine

## 2019-05-16 DIAGNOSIS — I10 Essential (primary) hypertension: Secondary | ICD-10-CM

## 2019-05-17 ENCOUNTER — Other Ambulatory Visit: Payer: Self-pay | Admitting: Internal Medicine

## 2019-05-17 DIAGNOSIS — E785 Hyperlipidemia, unspecified: Secondary | ICD-10-CM

## 2019-05-17 NOTE — Telephone Encounter (Signed)
Can you call pt for an appointment please?

## 2019-05-17 NOTE — Telephone Encounter (Signed)
Scheduled an appointment for 05/20/2019

## 2019-05-20 ENCOUNTER — Encounter: Payer: Self-pay | Admitting: Internal Medicine

## 2019-05-20 ENCOUNTER — Ambulatory Visit (INDEPENDENT_AMBULATORY_CARE_PROVIDER_SITE_OTHER): Payer: BC Managed Care – PPO | Admitting: Internal Medicine

## 2019-05-20 ENCOUNTER — Other Ambulatory Visit: Payer: Self-pay

## 2019-05-20 VITALS — BP 138/92 | HR 89 | Temp 99.0°F | Resp 16 | Ht 72.0 in | Wt 290.0 lb

## 2019-05-20 DIAGNOSIS — M5136 Other intervertebral disc degeneration, lumbar region: Secondary | ICD-10-CM

## 2019-05-20 DIAGNOSIS — M170 Bilateral primary osteoarthritis of knee: Secondary | ICD-10-CM

## 2019-05-20 DIAGNOSIS — I10 Essential (primary) hypertension: Secondary | ICD-10-CM

## 2019-05-20 DIAGNOSIS — Z794 Long term (current) use of insulin: Secondary | ICD-10-CM

## 2019-05-20 DIAGNOSIS — Z0000 Encounter for general adult medical examination without abnormal findings: Secondary | ICD-10-CM | POA: Diagnosis not present

## 2019-05-20 DIAGNOSIS — E1165 Type 2 diabetes mellitus with hyperglycemia: Secondary | ICD-10-CM | POA: Diagnosis not present

## 2019-05-20 LAB — CBC WITH DIFFERENTIAL/PLATELET
Basophils Absolute: 0.1 10*3/uL (ref 0.0–0.1)
Basophils Relative: 0.8 % (ref 0.0–3.0)
Eosinophils Absolute: 0.1 10*3/uL (ref 0.0–0.7)
Eosinophils Relative: 1.1 % (ref 0.0–5.0)
HCT: 48.3 % (ref 39.0–52.0)
Hemoglobin: 16.3 g/dL (ref 13.0–17.0)
Lymphocytes Relative: 25 % (ref 12.0–46.0)
Lymphs Abs: 1.8 10*3/uL (ref 0.7–4.0)
MCHC: 33.8 g/dL (ref 30.0–36.0)
MCV: 96.5 fl (ref 78.0–100.0)
Monocytes Absolute: 0.8 10*3/uL (ref 0.1–1.0)
Monocytes Relative: 11 % (ref 3.0–12.0)
Neutro Abs: 4.5 10*3/uL (ref 1.4–7.7)
Neutrophils Relative %: 62.1 % (ref 43.0–77.0)
Platelets: 285 10*3/uL (ref 150.0–400.0)
RBC: 5 Mil/uL (ref 4.22–5.81)
RDW: 14.6 % (ref 11.5–15.5)
WBC: 7.2 10*3/uL (ref 4.0–10.5)

## 2019-05-20 LAB — BASIC METABOLIC PANEL
BUN: 14 mg/dL (ref 6–23)
CO2: 28 mEq/L (ref 19–32)
Calcium: 9.9 mg/dL (ref 8.4–10.5)
Chloride: 100 mEq/L (ref 96–112)
Creatinine, Ser: 1.12 mg/dL (ref 0.40–1.50)
GFR: 85.06 mL/min (ref >60.00)
Glucose, Bld: 105 mg/dL — ABNORMAL HIGH (ref 70–99)
Potassium: 3.8 mEq/L (ref 3.5–5.1)
Sodium: 138 mEq/L (ref 135–145)

## 2019-05-20 LAB — PSA: PSA: 0.37 ng/mL (ref 0.10–4.00)

## 2019-05-20 LAB — HEMOGLOBIN A1C: Hgb A1c MFr Bld: 5.8 % (ref 4.6–6.5)

## 2019-05-20 LAB — TSH: TSH: 1.37 u[IU]/mL (ref 0.35–4.50)

## 2019-05-20 MED ORDER — NABUMETONE 500 MG PO TABS: 500.0000 mg | ORAL_TABLET | Freq: Two times a day (BID) | ORAL | 0 refills | Status: DC | PRN

## 2019-05-20 MED ORDER — IRBESARTAN 150 MG PO TABS: 150.0000 mg | ORAL_TABLET | Freq: Every day | ORAL | 1 refills | Status: DC

## 2019-05-20 NOTE — Progress Notes (Signed)
Subjective:  Patient ID: Tyler Benton, male    DOB: 30-May-1970  Age: 49 y.o. MRN: [id number]  CC: Annual Exam and Hypertension  This visit occurred during the SARS-CoV-2 public health emergency.  Safety protocols were in place, including screening questions prior to the visit, additional usage of staff PPE, and extensive cleaning of exam room while observing appropriate contact time as indicated for disinfecting solutions.    HPI [name] presents for a CPX.  He has intentionally lost weight with lifestyle modifications.  He tells me his blood sugar and blood pressure have been well controlled.  He complains of chronic, bilateral knee pain that he describes as a tightness at rest and with activity.  He is trying to control the pain with Aleve but it is caused an upset stomach.  He denies knee injury or recent episodes of redness or swelling.  He says that none of his other joints bother him.  His diabetes is treated by an endocrinologist.  Outpatient Medications Prior to Visit  Medication Sig Dispense Refill  . chlorthalidone (HYGROTON) 25 MG tablet TAKE 1 TABLET(25 MG) BY MOUTH DAILY 90 tablet 0  . glucose blood (ONETOUCH VERIO) test strip USE TO TEST BLOOD SUGAR ONCE DAILY AS DIRECTED 100 strip 2  . Insulin Lispro Prot & Lispro (HUMALOG MIX 75/25 KWIKPEN) (75-25) 100 UNIT/ML Kwikpen Inject 30 Units into the skin daily before supper. Inject 32 units under the skin three times daily before meals if blood sugar is over 130. 30 mL 1  . Insulin Pen Needle 31G X 5 MM MISC Use on insulin pen 2x daily 100 each 1  . ONE TOUCH LANCETS MISC Use to test blood sugar once daily 200 each 1  . rosuvastatin (CRESTOR) 20 MG tablet TAKE 1 TABLET(20 MG) BY MOUTH DAILY 90 tablet 0  . RYBELSUS 14 MG TABS TAKE 1 TABLET BY MOUTH EVERY DAY 30 tablet 3  . SYNJARDY XR 12.06-998 MG TB24 TAKE 2 TABLETS BY MOUTH DAILY WITH BREAKFAST 60 tablet 2  . levocetirizine (XYZAL) 5 MG tablet Take 1 tablet (5 mg total)  by mouth every evening. 90 tablet 1  . NEOMYCIN-POLYMYXIN-HYDROCORTISONE (CORTISPORIN) 1 % SOLN OTIC solution Place 3 drops into both ears every 8 (eight) hours. 10 mL 1  . triamcinolone (NASACORT) 55 MCG/ACT AERO nasal inhaler Place 2 sprays into the nose daily. 32.4 mL 3   No facility-administered medications prior to visit.    ROS Review of Systems  Constitutional: Negative.  Negative for diaphoresis, fatigue and unexpected weight change.  HENT: Negative.   Eyes: Negative.   Respiratory: Negative for cough, chest tightness, shortness of breath and wheezing.   Cardiovascular: Negative for chest pain and leg swelling.  Gastrointestinal: Negative for abdominal pain, blood in stool, diarrhea, nausea and vomiting.  Endocrine: Negative.   Genitourinary: Negative.  Negative for difficulty urinating, dysuria, penile swelling, scrotal swelling, testicular pain and urgency.  Musculoskeletal: Positive for arthralgias and back pain. Negative for myalgias.  Skin: Negative.   Neurological: Negative.  Negative for dizziness, weakness, light-headedness and headaches.  Hematological: Negative for adenopathy. Does not bruise/bleed easily.  Psychiatric/Behavioral: Negative.     Objective:  BP (!) 138/92 (BP Location: Left Arm, Patient Position: Sitting, Cuff Size: Large)   Pulse 89   Temp 99 F (37.2 C) (Oral)   Resp 16   Ht 6' (1.829 m)   Wt 290 lb (131.5 kg)   SpO2 96%   BMI 39.33 kg/m   BP  Readings from Last 3 Encounters:  05/20/19 (!) 138/92  02/09/19 134/82  11/05/18 124/88    Wt Readings from Last 3 Encounters:  05/20/19 290 lb (131.5 kg)  02/09/19 295 lb 9.6 oz (134.1 kg)  11/05/18 (!) 304 lb 3.2 oz (138 kg)    Physical Exam Vitals reviewed.  Constitutional:      Appearance: Normal appearance. He is obese. He is not ill-appearing.  HENT:     Nose: Nose normal.     Mouth/Throat:     Mouth: Mucous membranes are moist.  Eyes:     General: No scleral icterus.     Conjunctiva/sclera: Conjunctivae normal.  Cardiovascular:     Rate and Rhythm: Normal rate and regular rhythm.     Heart sounds: Normal heart sounds, S1 normal and S2 normal. No murmur.     Comments: EKG- NSR, 87 bpm No LVH Normal EKG Pulmonary:     Effort: Pulmonary effort is normal.     Breath sounds: No stridor. No wheezing, rhonchi or rales.  Abdominal:     General: Abdomen is flat.     Palpations: There is no mass.     Tenderness: There is no abdominal tenderness. There is no guarding.     Hernia: There is no hernia in the left inguinal area or right inguinal area.  Genitourinary:    Pubic Area: No rash.      Penis: Normal. No swelling or lesions.      Testes: Normal.        Right: Mass, tenderness or swelling not present.        Left: Mass, tenderness or swelling not present.     Epididymis:     Right: Normal. Not inflamed or enlarged. No mass.     Left: Normal. Not inflamed or enlarged. No mass.     Prostate: Normal. Not enlarged, not tender and no nodules present.     Rectum: Normal. No mass, tenderness, anal fissure, external hemorrhoid or internal hemorrhoid. Normal anal tone.  Musculoskeletal:     Cervical back: Neck supple.     Right knee: Deformity (DJD) present. No swelling, bony tenderness or crepitus. Normal range of motion. No tenderness.     Left knee: Deformity (DJD) present. No swelling, bony tenderness or crepitus. Normal range of motion. No tenderness.     Right lower leg: No edema.     Left lower leg: No edema.  Lymphadenopathy:     Cervical: No cervical adenopathy.     Lower Body: No right inguinal adenopathy. No left inguinal adenopathy.  Neurological:     Mental Status: He is alert.     Lab Results  Component Value Date   WBC 7.2 05/20/2019   HGB 16.3 05/20/2019   HCT 48.3 05/20/2019   PLT 285.0 05/20/2019   GLUCOSE 105 (H) 05/20/2019   CHOL 126 02/09/2019   TRIG 114.0 02/09/2019   HDL 38.20 (L) 02/09/2019   LDLDIRECT 141.3 01/12/2014    LDLCALC 65 02/09/2019   ALT 23 02/09/2019   AST 23 02/09/2019   NA 138 05/20/2019   K 3.8 05/20/2019   CL 100 05/20/2019   CREATININE 1.12 05/20/2019   BUN 14 05/20/2019   CO2 28 05/20/2019   TSH 1.37 05/20/2019   PSA 0.37 05/20/2019   HGBA1C 5.8 05/20/2019   MICROALBUR <0.7 11/05/2018    DG Lumbar Spine Complete  Result Date: 02/12/2017 CLINICAL DATA:  Chronic low back pain, worse with standing. EXAM: LUMBAR SPINE - COMPLETE 4+  VIEW COMPARISON:  None. FINDINGS: Five lumbar type vertebral bodies show normal alignment. Mild disc space narrowing L4-5. Mild lower lumbar facet arthritis. No pars defect or slippage. Sacroiliac joints appear normal. IMPRESSION: Mild disc space narrowing L4-5.  No acute finding. Electronically Signed   By: Paulina Fusi M.D.   On: 02/12/2017 15:20   DG Shoulder Right  Result Date: 02/12/2017 CLINICAL DATA:  Shoulder pain after injury 4 months ago. EXAM: RIGHT SHOULDER - 2+ VIEW COMPARISON:  None. FINDINGS: Glenohumeral joint appears normal. Narrowing of the humeral acromial distance suggesting rotator cuff pathology. Greater tuberosity osteophyte formation. AC joint degenerative change. IMPRESSION: No acute or traumatic finding. Narrowing of the humeral acromial distance. Greater tuberosity osteophyte formation. AC joint osteoarthritis. Electronically Signed   By: Paulina Fusi M.D.   On: 02/12/2017 15:21   [place] LIMITED JOINT SPACE STRUCTURES UP RIGHT  Result Date: 02/22/2017 [facility] US performed of: Right This study was ordered, performed, and interpreted by Terrilee Files D.O.  Shoulder:  Supraspinatus: Articular sided tear noted with what appears to be a very small Hill-Sachs deformity  bursal bulge seen with shoulder abduction on impingement view. Subscapularis:  Appears normal on long and transverse views. Positive bursa Teres Minor:  Appears normal on long and transverse views. AC joint: Arthritic changes Glenohumeral Joint:  Appears normal without effusion. Glenoid  Labrum:  Intact without visualized tears. Biceps Tendon:  Appears normal on long and transverse views, no fraying of tendon, tendon located in intertubercular groove, no subluxation with shoulder internal or external rotation.  Impression: Partial rotator cuff tear   Assessment & Plan:   Gagandeep was seen today for annual exam and hypertension.  Diagnoses and all orders for this visit:  Essential hypertension- His blood pressure is adequately well controlled.  I have recommended adding an ARB to the thiazide diuretic. -     EKG 12-Lead -     irbesartan (AVAPRO) 150 MG tablet; Take 1 tablet (150 mg total) by mouth daily. -     CBC with Differential/Platelet; Future -     TSH; Future -     TSH -     CBC with Differential/Platelet  Uncontrolled type 2 diabetes mellitus with hyperglycemia, with long-term current use of insulin (HCC)- His blood sugar is over controlled.  He will see his endocrinologist soon to discuss this. -     irbesartan (AVAPRO) 150 MG tablet; Take 1 tablet (150 mg total) by mouth daily. -     Basic metabolic panel; Future -     Hemoglobin A1c; Future -     Hemoglobin A1c -     Basic metabolic panel  DDD (degenerative disc disease), lumbar -     nabumetone (RELAFEN) 500 MG tablet; Take 1 tablet (500 mg total) by mouth 2 (two) times daily as needed.  Routine general medical examination at a health care facility- Exam completed, labs reviewed, no vaccines are due, patient education was given. -     PSA; Future -     PSA  Severe obesity (BMI >= 40) (HCC)- He was praised for his lifestyle modifications.  Primary osteoarthritis of both knees -     nabumetone (RELAFEN) 500 MG tablet; Take 1 tablet (500 mg total) by mouth 2 (two) times daily as needed.   I have discontinued Alyaan L. Akhavan's levocetirizine, triamcinolone, and NEOMYCIN-POLYMYXIN-HYDROCORTISONE. I am also having him start on irbesartan and nabumetone. Additionally, I am having him maintain his ONE TOUCH  LANCETS, Insulin Pen Needle, Insulin Lispro  Prot & Lispro, chlorthalidone, rosuvastatin, Synjardy XR, OneTouch Verio, and Rybelsus.  Meds ordered this encounter  Medications  . irbesartan (AVAPRO) 150 MG tablet    Sig: Take 1 tablet (150 mg total) by mouth daily.    Dispense:  90 tablet    Refill:  1  . nabumetone (RELAFEN) 500 MG tablet    Sig: Take 1 tablet (500 mg total) by mouth 2 (two) times daily as needed.    Dispense:  180 tablet    Refill:  0     Follow-up: Return in about 3 months (around 08/19/2019).  Scarlette Calico, MD

## 2019-05-20 NOTE — Patient Instructions (Signed)

## 2019-06-03 ENCOUNTER — Other Ambulatory Visit: Payer: Self-pay

## 2019-06-03 MED ORDER — SYNJARDY XR 12.5-1000 MG PO TB24: 2.0000 | ORAL_TABLET | Freq: Every day | ORAL | 2 refills | Status: DC

## 2019-06-03 MED ORDER — INSULIN LISPRO PROT & LISPRO (75-25 MIX) 100 UNIT/ML KWIKPEN: 30.0000 [IU] | PEN_INJECTOR | Freq: Every day | SUBCUTANEOUS | 1 refills | Status: DC

## 2019-06-09 ENCOUNTER — Other Ambulatory Visit: Payer: Self-pay

## 2019-06-10 ENCOUNTER — Ambulatory Visit: Payer: BC Managed Care – PPO | Admitting: Endocrinology

## 2019-06-11 ENCOUNTER — Other Ambulatory Visit: Payer: Self-pay

## 2019-06-11 ENCOUNTER — Ambulatory Visit: Payer: BC Managed Care – PPO | Admitting: Endocrinology

## 2019-06-14 ENCOUNTER — Other Ambulatory Visit: Payer: Self-pay

## 2019-06-14 ENCOUNTER — Encounter: Payer: Self-pay | Admitting: Endocrinology

## 2019-06-14 ENCOUNTER — Ambulatory Visit (INDEPENDENT_AMBULATORY_CARE_PROVIDER_SITE_OTHER): Payer: BC Managed Care – PPO | Admitting: Endocrinology

## 2019-06-14 VITALS — BP 108/72 | HR 96 | Temp 99.4°F | Wt 296.2 lb

## 2019-06-14 DIAGNOSIS — E1165 Type 2 diabetes mellitus with hyperglycemia: Secondary | ICD-10-CM

## 2019-06-14 DIAGNOSIS — Z794 Long term (current) use of insulin: Secondary | ICD-10-CM | POA: Diagnosis not present

## 2019-06-14 DIAGNOSIS — I10 Essential (primary) hypertension: Secondary | ICD-10-CM | POA: Diagnosis not present

## 2019-06-14 NOTE — Progress Notes (Signed)
Patient ID: Tyler Benton, male   DOB: [birth year], 49 y.o.   MRN: [id number]            Reason for Appointment: Follow-up for Type 2 Diabetes  Referring physician: Sanda Linger  History of Present Illness:          Diagnosis: Type 2 diabetes mellitus, date of diagnosis: 2014       Past history: He had routine labs in 2014 which showed A1c of 6.6 and glucose of 158 fasting This was felt to be borderline and he was not started on any medication He was given Belviq for weight loss but he did not continue this after 2 weeks since he did not help and he found it expensive He was referred for evaluation because of poor blood sugar control in 8/15 and his blood sugar was significantly high with fasting glucose of 266 and A1c of 9.2 This was despite his trying to lose weight and he had lost 30 pounds along with exercising regularly His PCP had started treatment of his diabetes with a combination treatment using Victoza and Xigduo He was having poor control of his diabetes with A1c 9.2 in 2/17 because of noncompliance with on measures of self-care and medications and having symptomatic hyperglycemia  A1c done in 12/19 had gone up to 13%  Recent history:   Insulin regimen: Humalog 75/25, none  Non-insulin hypoglycemic drugs : Rybelsus 14 mg daily, Synjardy XR 12.06/998, 2 tablets daily  His A1c is consistently in the normal range and last month 5.8, was 5.5   Current management, blood sugar patterns and problems identified:  He has not taken insulin for 2 weeks although he thinks he was taking it until about then  However does not appear to have any difference in his blood sugars with or without insulin, previously taking 25 units around lunchtime as before  He had been told to stop this on the previous visit to try without it and increase efforts to lose weight otherwise  He appears to have gained back some of the weight that he had lost this year  Is doing some walking   Occasionally will go up with diet with only 1 significantly high reading of 267  Recently fasting blood sugars are somewhat variable, once 139  He thinks that if he eats fruit for snack late at night he will have a high sugar in the morning  No nausea with 14 mg Rybelsus  Previously no hypoglycemia with insulin      Side effects from medications have been:  none  His dinnertime is variable in the evenings, usually first meal of the day is about 12 noon  Glucose monitoring:  Less than once a day on an average      Glucometer: One touch Verio   Blood Glucose readings from monitor download show blood sugars as below   PRE-MEAL Fasting Lunch  afternoon Bedtime Overall  Glucose range:  90-155  135  114, 124  90-267    Mean/median:     131   Previous readings  PRE-MEAL Fasting  midday Dinner  overnight Overall  Glucose range:  121, 121  105-134  102  101-164   Mean/median:   125    121   POST-MEAL PC Breakfast PC Lunch PC Dinner  Glucose range:   89   Mean/median:        Self-care: He has been consistent with diet usually   Meals: 3 meals per day. Breakfast is protein  shake or oatmeal, lunch is frequently salad or sandwich. Dinner usually sandwich         Exercise:  60 min walking/steps    Dietician visit, most recent: 10/15            Weight history: was 380 in 2014  Wt Readings from Last 3 Encounters:  06/14/19 296 lb 3.2 oz (134.4 kg)  05/20/19 290 lb (131.5 kg)  02/09/19 295 lb 9.6 oz (134.1 kg)    Glycemic control:   Lab Results  Component Value Date   HGBA1C 5.8 05/20/2019   HGBA1C 5.5 02/09/2019   HGBA1C 5.8 (A) 11/05/2018   Lab Results  Component Value Date   MICROALBUR <0.7 11/05/2018   LDLCALC 65 02/09/2019   CREATININE 1.12 05/20/2019   No visits with results within 1 Week(s) from this visit.  Latest known visit with results is:  Office Visit on 05/20/2019  Component Date Value Ref Range Status  . HM Diabetic Eye Exam 08/28/2018 No Retinopathy   No Retinopathy Final  . PSA 05/20/2019 0.37  0.10 - 4.00 ng/mL Final   Test performed using Access Hybritech PSA Assay, a parmagnetic partical, chemiluminecent immunoassay.  . Hgb A1c MFr Bld 05/20/2019 5.8  4.6 - 6.5 % Final   Glycemic Control Guidelines for People with Diabetes:Non Diabetic:  <6%Goal of Therapy: <7%Additional Action Suggested:  >8%   . TSH 05/20/2019 1.37  0.35 - 4.50 uIU/mL Final  . Sodium 05/20/2019 138  135 - 145 mEq/L Final  . Potassium 05/20/2019 3.8  3.5 - 5.1 mEq/L Final  . Chloride 05/20/2019 100  96 - 112 mEq/L Final  . CO2 05/20/2019 28  19 - 32 mEq/L Final  . Glucose, Bld 05/20/2019 105* 70 - 99 mg/dL Final  . BUN 71/24/5809 14  6 - 23 mg/dL Final  . Creatinine, Ser 05/20/2019 1.12  0.40 - 1.50 mg/dL Final  . GFR 98/33/8250 85.06  >60.00 mL/min Final  . Calcium 05/20/2019 9.9  8.4 - 10.5 mg/dL Final  . WBC 53/97/6734 7.2  4.0 - 10.5 K/uL Final  . RBC 05/20/2019 5.00  4.22 - 5.81 Mil/uL Final  . Hemoglobin 05/20/2019 16.3  13.0 - 17.0 g/dL Final  . HCT 19/37/9024 48.3  39.0 - 52.0 % Final  . MCV 05/20/2019 96.5  78.0 - 100.0 fl Final  . MCHC 05/20/2019 33.8  30.0 - 36.0 g/dL Final  . RDW 09/73/5329 14.6  11.5 - 15.5 % Final  . Platelets 05/20/2019 285.0  150.0 - 400.0 K/uL Final  . Neutrophils Relative % 05/20/2019 62.1  43.0 - 77.0 % Final  . Lymphocytes Relative 05/20/2019 25.0  12.0 - 46.0 % Final  . Monocytes Relative 05/20/2019 11.0  3.0 - 12.0 % Final  . Eosinophils Relative 05/20/2019 1.1  0.0 - 5.0 % Final  . Basophils Relative 05/20/2019 0.8  0.0 - 3.0 % Final  . Neutro Abs 05/20/2019 4.5  1.4 - 7.7 K/uL Final  . Lymphs Abs 05/20/2019 1.8  0.7 - 4.0 K/uL Final  . Monocytes Absolute 05/20/2019 0.8  0.1 - 1.0 K/uL Final  . Eosinophils Absolute 05/20/2019 0.1  0.0 - 0.7 K/uL Final  . Basophils Absolute 05/20/2019 0.1  0.0 - 0.1 K/uL Final    Other active problems: See review of systems   Allergies as of 06/14/2019   No Known Allergies      Medication List       Accurate as of Jun 14, 2019 11:59 PM. If you have any questions,  ask your nurse or doctor.        STOP taking these medications   Insulin Lispro Prot & Lispro (75-25) 100 UNIT/ML Kwikpen Commonly known as: HumaLOG Mix 75/25 KwikPen Stopped by: Reather LittlerAjay Sael Furches, MD     TAKE these medications   chlorthalidone 25 MG tablet Commonly known as: HYGROTON TAKE 1 TABLET(25 MG) BY MOUTH DAILY   Insulin Pen Needle 31G X 5 MM Misc Use on insulin pen 2x daily   irbesartan 150 MG tablet Commonly known as: AVAPRO Take 1 tablet (150 mg total) by mouth daily.   nabumetone 500 MG tablet Commonly known as: Relafen Take 1 tablet (500 mg total) by mouth 2 (two) times daily as needed.   ONE TOUCH LANCETS Misc Use to test blood sugar once daily   OneTouch Verio test strip Generic drug: glucose blood USE TO TEST BLOOD SUGAR ONCE DAILY AS DIRECTED   rosuvastatin 20 MG tablet Commonly known as: CRESTOR TAKE 1 TABLET(20 MG) BY MOUTH DAILY   Rybelsus 14 MG Tabs Generic drug: Semaglutide TAKE 1 TABLET BY MOUTH EVERY DAY   Synjardy XR 12.06-998 MG Tb24 Generic drug: Empagliflozin-metFORMIN HCl ER Take 2 tablets by mouth daily with breakfast.       Allergies: No Known Allergies  Past Medical History:  Diagnosis Date  . Diabetes mellitus without complication (HCC)   . GERD (gastroesophageal reflux disease)   . Obesity     Past Surgical History:  Procedure Laterality Date  . GROWTH PLATE SURGERY Bilateral 1980s  . WISDOM TOOTH EXTRACTION      Family History  Problem Relation Age of Onset  . Heart disease Mother   . Hypertension Mother   . Early death Neg Hx   . Alcohol abuse Neg Hx   . Birth defects Neg Hx   . Cancer Neg Hx   . COPD Neg Hx   . Drug abuse Neg Hx   . Hyperlipidemia Neg Hx   . Kidney disease Neg Hx   . Stroke Neg Hx   . Thyroid disease Neg Hx   . Diabetes Maternal Grandfather     Social History:  reports that he has never smoked. He has  never used smokeless tobacco. He reports that he does not drink alcohol or use drugs.    Review of Systems         Lipids: He has been prescribed Crestor 20 mg daily by his PCP with adequate control       Lab Results  Component Value Date   CHOL 126 02/09/2019   HDL 38.20 (L) 02/09/2019   LDLCALC 65 02/09/2019   LDLDIRECT 141.3 01/12/2014   TRIG 114.0 02/09/2019   CHOLHDL 3 02/09/2019                  Thyroid: He was found to have left-sided thyroid nodule on initial exam and this was benign on biopsy On exam he has had a 2.5-3 cm nodule on the left side  Lab Results  Component Value Date   TSH 1.37 05/20/2019    HYPERTENSION: He is now on Avapro 150 mg, this was added to his chlorthalidone on his last visit with PCP in April However he thinks his blood pressure was higher last month because of being anxious after driving to the visit He has not taken his chlorthalidone for the last month on his own No lightheadedness but blood pressure was low normal today  He has been checking blood pressure at work, recently systolic  readings mostly about 518 and diastolic about 84-16    BP Readings from Last 3 Encounters:  06/14/19 108/72  05/20/19 (!) 138/92  02/09/19 134/82      LABS:  No visits with results within 1 Week(s) from this visit.  Latest known visit with results is:  Office Visit on 05/20/2019  Component Date Value Ref Range Status  . HM Diabetic Eye Exam 08/28/2018 No Retinopathy  No Retinopathy Final  . PSA 05/20/2019 0.37  0.10 - 4.00 ng/mL Final   Test performed using Access Hybritech PSA Assay, a parmagnetic partical, chemiluminecent immunoassay.  . Hgb A1c MFr Bld 05/20/2019 5.8  4.6 - 6.5 % Final   Glycemic Control Guidelines for People with Diabetes:Non Diabetic:  <6%Goal of Therapy: <7%Additional Action Suggested:  >8%   . TSH 05/20/2019 1.37  0.35 - 4.50 uIU/mL Final  . Sodium 05/20/2019 138  135 - 145 mEq/L Final  . Potassium 05/20/2019 3.8  3.5 -  5.1 mEq/L Final  . Chloride 05/20/2019 100  96 - 112 mEq/L Final  . CO2 05/20/2019 28  19 - 32 mEq/L Final  . Glucose, Bld 05/20/2019 105* 70 - 99 mg/dL Final  . BUN 05/20/2019 14  6 - 23 mg/dL Final  . Creatinine, Ser 05/20/2019 1.12  0.40 - 1.50 mg/dL Final  . GFR 05/20/2019 85.06  >60.00 mL/min Final  . Calcium 05/20/2019 9.9  8.4 - 10.5 mg/dL Final  . WBC 05/20/2019 7.2  4.0 - 10.5 K/uL Final  . RBC 05/20/2019 5.00  4.22 - 5.81 Mil/uL Final  . Hemoglobin 05/20/2019 16.3  13.0 - 17.0 g/dL Final  . HCT 05/20/2019 48.3  39.0 - 52.0 % Final  . MCV 05/20/2019 96.5  78.0 - 100.0 fl Final  . MCHC 05/20/2019 33.8  30.0 - 36.0 g/dL Final  . RDW 05/20/2019 14.6  11.5 - 15.5 % Final  . Platelets 05/20/2019 285.0  150.0 - 400.0 K/uL Final  . Neutrophils Relative % 05/20/2019 62.1  43.0 - 77.0 % Final  . Lymphocytes Relative 05/20/2019 25.0  12.0 - 46.0 % Final  . Monocytes Relative 05/20/2019 11.0  3.0 - 12.0 % Final  . Eosinophils Relative 05/20/2019 1.1  0.0 - 5.0 % Final  . Basophils Relative 05/20/2019 0.8  0.0 - 3.0 % Final  . Neutro Abs 05/20/2019 4.5  1.4 - 7.7 K/uL Final  . Lymphs Abs 05/20/2019 1.8  0.7 - 4.0 K/uL Final  . Monocytes Absolute 05/20/2019 0.8  0.1 - 1.0 K/uL Final  . Eosinophils Absolute 05/20/2019 0.1  0.0 - 0.7 K/uL Final  . Basophils Absolute 05/20/2019 0.1  0.0 - 0.1 K/uL Final    Physical Examination:  BP 108/72 (Cuff Size: Large)   Pulse 96   Temp 99.4 F (37.4 C) (Oral)   Wt 296 lb 3.2 oz (134.4 kg)   SpO2 100%   BMI 40.17 kg/m      ASSESSMENT/PLAN:  Diabetes type 2 with morbid obesity  See history of present illness for detailed discussion of his current management, blood sugar patterns and problems identified  A1c is stable at 5.8 as of 4/21  We will again been advised to leave off insulin and see if he can get his blood sugar control with lifestyle changes he gives taking insulin on and off Without any insulin the last 2 weeks his blood  sugars do not appear any higher or different However in the last few months he has overall lost some weight Currently he thinks he  can do more exercise with joining the gym Does have a tendency to sometimes higher readings fasting possibly from dawn phenomenon  Recommendations:  Check blood sugars consistently including 2 hours after his main meal  No need for insulin unless he has consistently high readings after dinner or in the morning and he should let us know  May possibly need only low-dose basal insulin  Consistent exercise  Avoid high carbohydrate and high fat meals  Discussed blood sugar targets at various times  Avoid high glycemic index fruits such as grapes or large portions of other fluids    HYPERTENSION: Blood pressure is surprisingly low normal now although asymptomatic This is without taking his chlorthalidone and he will continue Avapro for now  LIPIDS: He has good control   Covid vaccination: He says he is planning to take this now even though this was discussed and recommended in January  Given him patient education information on this    Patient Instructions  Check blood sugars on waking up  3-4  days a week  Also check blood sugars about 2 hours after meals and do this after different meals by rotation  Recommended blood sugar levels on waking up are 90-130 and about 2 hours after meal is 130-160  Please bring your blood sugar monitor to each visit, thank you  Stay off diuretic    Reather Littler 06/15/2019, 8:39 AM   Note: This office note was prepared with Dragon voice recognition system technology. Any transcriptional errors that result from this process are unintentional.

## 2019-06-14 NOTE — Patient Instructions (Addendum)
Check blood sugars on waking up  3-4  days a week  Also check blood sugars about 2 hours after meals and do this after different meals by rotation  Recommended blood sugar levels on waking up are 90-130 and about 2 hours after meal is 130-160  Please bring your blood sugar monitor to each visit, thank you  Stay off diuretic

## 2019-08-04 ENCOUNTER — Other Ambulatory Visit: Payer: Self-pay | Admitting: Endocrinology

## 2019-08-19 ENCOUNTER — Other Ambulatory Visit: Payer: Self-pay | Admitting: Internal Medicine

## 2019-08-19 DIAGNOSIS — M170 Bilateral primary osteoarthritis of knee: Secondary | ICD-10-CM

## 2019-08-19 DIAGNOSIS — M5136 Other intervertebral disc degeneration, lumbar region: Secondary | ICD-10-CM

## 2019-08-23 ENCOUNTER — Encounter: Payer: Self-pay | Admitting: Internal Medicine

## 2019-08-24 ENCOUNTER — Other Ambulatory Visit: Payer: Self-pay | Admitting: Internal Medicine

## 2019-08-24 DIAGNOSIS — M170 Bilateral primary osteoarthritis of knee: Secondary | ICD-10-CM

## 2019-08-24 DIAGNOSIS — M5136 Other intervertebral disc degeneration, lumbar region: Secondary | ICD-10-CM

## 2019-08-24 MED ORDER — NABUMETONE 500 MG PO TABS: ORAL_TABLET | ORAL | 0 refills | Status: DC

## 2019-09-05 ENCOUNTER — Other Ambulatory Visit: Payer: Self-pay | Admitting: Endocrinology

## 2019-09-14 ENCOUNTER — Encounter: Payer: Self-pay | Admitting: Internal Medicine

## 2019-10-15 ENCOUNTER — Other Ambulatory Visit (INDEPENDENT_AMBULATORY_CARE_PROVIDER_SITE_OTHER): Payer: BC Managed Care – PPO

## 2019-10-15 ENCOUNTER — Other Ambulatory Visit: Payer: Self-pay

## 2019-10-15 ENCOUNTER — Ambulatory Visit (INDEPENDENT_AMBULATORY_CARE_PROVIDER_SITE_OTHER): Payer: BC Managed Care – PPO | Admitting: Endocrinology

## 2019-10-15 ENCOUNTER — Encounter: Payer: Self-pay | Admitting: Endocrinology

## 2019-10-15 VITALS — BP 140/100 | HR 75 | Ht 72.0 in | Wt 306.0 lb

## 2019-10-15 DIAGNOSIS — E1165 Type 2 diabetes mellitus with hyperglycemia: Secondary | ICD-10-CM

## 2019-10-15 DIAGNOSIS — E785 Hyperlipidemia, unspecified: Secondary | ICD-10-CM

## 2019-10-15 DIAGNOSIS — Z794 Long term (current) use of insulin: Secondary | ICD-10-CM | POA: Diagnosis not present

## 2019-10-15 LAB — COMPREHENSIVE METABOLIC PANEL
ALT: 21 U/L (ref 0–53)
AST: 16 U/L (ref 0–37)
Albumin: 4.6 g/dL (ref 3.5–5.2)
Alkaline Phosphatase: 55 U/L (ref 39–117)
BUN: 11 mg/dL (ref 6–23)
CO2: 30 mEq/L (ref 19–32)
Calcium: 10 mg/dL (ref 8.4–10.5)
Chloride: 103 mEq/L (ref 96–112)
Creatinine, Ser: 0.84 mg/dL (ref 0.40–1.50)
GFR: 118.34 mL/min (ref >60.00)
Glucose, Bld: 90 mg/dL (ref 70–99)
Potassium: 3.8 mEq/L (ref 3.5–5.1)
Sodium: 140 mEq/L (ref 135–145)
Total Bilirubin: 1.4 mg/dL — ABNORMAL HIGH (ref 0.2–1.2)
Total Protein: 7.7 g/dL (ref 6.0–8.3)

## 2019-10-15 LAB — URINALYSIS, ROUTINE W REFLEX MICROSCOPIC
Bilirubin Urine: NEGATIVE
Hgb urine dipstick: NEGATIVE
Ketones, ur: NEGATIVE
Leukocytes,Ua: NEGATIVE
Nitrite: NEGATIVE
RBC / HPF: NONE SEEN (ref 0–?)
Specific Gravity, Urine: 1.02 (ref 1.000–1.030)
Total Protein, Urine: NEGATIVE
Urine Glucose: >=1000 — AB
Urobilinogen, UA: 0.2 (ref 0.0–1.0)
pH: 5.5 (ref 5.0–8.0)

## 2019-10-15 LAB — POCT GLYCOSYLATED HEMOGLOBIN (HGB A1C): Hemoglobin A1C: 5.2 % (ref 4.0–5.6)

## 2019-10-15 LAB — LIPID PANEL
Cholesterol: 116 mg/dL (ref 0–200)
HDL: 43.4 mg/dL (ref >39.00)
LDL Cholesterol: 58 mg/dL (ref 0–99)
NonHDL: 72.33
Total CHOL/HDL Ratio: 3
Triglycerides: 70 mg/dL (ref 0.0–149.0)
VLDL: 14 mg/dL (ref 0.0–40.0)

## 2019-10-15 LAB — MICROALBUMIN / CREATININE URINE RATIO
Creatinine,U: 94.3 mg/dL
Microalb Creat Ratio: 0.7 mg/g (ref 0.0–30.0)
Microalb, Ur: <0.7 mg/dL (ref 0.0–1.9)

## 2019-10-15 NOTE — Patient Instructions (Addendum)
Take 2 Avapro daily  Check blood sugars on waking up days a week  Also check blood sugars about 2 hours after meals and do this after different meals by rotation  Recommended blood sugar levels on waking up are 90-130 and about 2 hours after meal is 130-160  Please bring your blood sugar monitor to each visit, thank you

## 2019-10-15 NOTE — Progress Notes (Signed)
Please call to let patient know that the lab results are normal and no further action needed

## 2019-10-15 NOTE — Progress Notes (Signed)
Patient ID: Tyler Benton, male   DOB: Jul 10, 1970, 49 y.o.   MRN: [id number]            Reason for Appointment: Follow-up for Type 2 Diabetes  Referring physician: Sanda Linger  History of Present Illness:          Diagnosis: Type 2 diabetes mellitus, date of diagnosis: 2014       Past history: He had routine labs in 2014 which showed A1c of 6.6 and glucose of 158 fasting This was felt to be borderline and he was not started on any medication He was given Belviq for weight loss but he did not continue this after 2 weeks since he did not help and he found it expensive He was referred for evaluation because of poor blood sugar control in 8/15 and his blood sugar was significantly high with fasting glucose of 266 and A1c of 9.2 This was despite his trying to lose weight and he had lost 30 pounds along with exercising regularly His PCP had started treatment of his diabetes with a combination treatment using Victoza and Xigduo He was having poor control of his diabetes with A1c 9.2 in 2/17 because of noncompliance with on measures of self-care and medications and having symptomatic hyperglycemia  A1c done in 12/19 had gone up to 13%  Recent history:   Insulin regimen: Humalog 75/25, none  Non-insulin hypoglycemic drugs : Rybelsus 14 mg daily, Synjardy XR 12.06/998, 2 tablets daily  His A1c is consistently in the normal range and now 5.2  A1c lower than expected for his home blood sugar average of 125  Current management, blood sugar patterns and problems identified:  He is coming back for his 66-month visit  Has not taken insulin as directed  However he says that because he has been inconsistent with diet he will sometimes take it when he goes off his diet  Also he thinks that if he takes some insulin even when blood sugars are near normal he will have less polyuria  However has gained 10 pounds with not being consistent with diet and eating more desserts  However only has  occasional readings documented in the last month on his meter after lunch or dinner  No side effects from Rybelsus which he takes in the mornings      Side effects from medications have been:  none  His dinnertime is variable in the evenings, usually first meal of the day is about 12 noon  Glucose monitoring:  Less than once a day on an average      Glucometer: One touch Verio   Blood Glucose readings from monitor download show blood sugars as below   PRE-MEAL  mornings  Lunch Dinner Bedtime Overall  Glucose range: 96-123    90-196   Mean/median:     137  125   POST-MEAL PC Breakfast PC Lunch PC Dinner  Glucose range:   99-182   Mean/median:   122    Previous readings:   PRE-MEAL Fasting Lunch  afternoon Bedtime Overall  Glucose range:  90-155  135  114, 124  90-267    Mean/median:     131      Self-care: He has been consistent with diet usually   Meals: 3 meals per day. Breakfast is protein shake or oatmeal, lunch is frequently salad or sandwich. Dinner usually sandwich         Exercise:  off and on  Dietician visit, most recent: 10/15  Weight history: was 380 in 2014  Wt Readings from Last 3 Encounters:  10/15/19 (!) 306 lb (138.8 kg)  06/14/19 296 lb 3.2 oz (134.4 kg)  05/20/19 290 lb (131.5 kg)    Glycemic control:   Lab Results  Component Value Date   HGBA1C 5.8 05/20/2019   HGBA1C 5.5 02/09/2019   HGBA1C 5.8 (A) 11/05/2018   Lab Results  Component Value Date   MICROALBUR <0.7 10/15/2019   LDLCALC 58 10/15/2019   CREATININE 0.84 10/15/2019     Other active problems: See review of systems   Allergies as of 10/15/2019   No Known Allergies     Medication List       Accurate as of October 15, 2019  4:23 PM. If you have any questions, ask your nurse or doctor.        chlorthalidone 25 MG tablet Commonly known as: HYGROTON TAKE 1 TABLET(25 MG) BY MOUTH DAILY   Insulin Lispro Prot & Lispro (75-25) 100 UNIT/ML Kwikpen Commonly  known as: HUMALOG 75/25 MIX INJECT 30 UNITS INTO THE SKIN DAILY BEFORE SUPPER, INJECT 32 UNITS UNDER THE SKIN THREE TIMES DAILY BEFORE MEALS IF BLOOD SUGAR IS OVER 130   Insulin Pen Needle 31G X 5 MM Misc Use on insulin pen 2x daily   irbesartan 150 MG tablet Commonly known as: AVAPRO Take 1 tablet (150 mg total) by mouth daily.   nabumetone 500 MG tablet Commonly known as: RELAFEN TAKE 1 TABLET(500 MG) BY MOUTH TWICE DAILY AS NEEDED   ONE TOUCH LANCETS Misc Use to test blood sugar once daily   OneTouch Verio test strip Generic drug: glucose blood USE TO TEST BLOOD SUGAR ONCE DAILY AS DIRECTED   rosuvastatin 20 MG tablet Commonly known as: CRESTOR TAKE 1 TABLET(20 MG) BY MOUTH DAILY   Rybelsus 14 MG Tabs Generic drug: Semaglutide TAKE 1 TABLET BY MOUTH EVERY DAY   Synjardy XR 12.06-998 MG Tb24 Generic drug: Empagliflozin-metFORMIN HCl ER TAKE 2 TABLETS BY MOUTH DAILY WITH BREAKFAST       Allergies: No Known Allergies  Past Medical History:  Diagnosis Date  . Diabetes mellitus without complication (HCC)   . GERD (gastroesophageal reflux disease)   . Obesity     Past Surgical History:  Procedure Laterality Date  . GROWTH PLATE SURGERY Bilateral 1980s  . WISDOM TOOTH EXTRACTION      Family History  Problem Relation Age of Onset  . Heart disease Mother   . Hypertension Mother   . Early death Neg Hx   . Alcohol abuse Neg Hx   . Birth defects Neg Hx   . Cancer Neg Hx   . COPD Neg Hx   . Drug abuse Neg Hx   . Hyperlipidemia Neg Hx   . Kidney disease Neg Hx   . Stroke Neg Hx   . Thyroid disease Neg Hx   . Diabetes Maternal Grandfather     Social History:  reports that he has never smoked. He has never used smokeless tobacco. He reports that he does not drink alcohol and does not use drugs.    Review of Systems         Lipids: He has been prescribed Crestor 20 mg daily by his PCP, labs as follows       Lab Results  Component Value Date   CHOL 116  10/15/2019   HDL 43.40 10/15/2019   LDLCALC 58 10/15/2019   LDLDIRECT 141.3 01/12/2014   TRIG 70.0 10/15/2019   CHOLHDL  3 10/15/2019                  Thyroid: He was found to have left-sided thyroid nodule on initial exam and this was benign on biopsy On exam he has had a 2.5-3 cm nodule on the left side  Lab Results  Component Value Date   TSH 1.37 05/20/2019    HYPERTENSION: He is now on Avapro 150 mg He has not taken his chlorthalidone previously prescribed by his PCP  He has been checking blood pressure at work, recently systolic readings mostly about 120-130 and diastolic about 80-88 Has not taken his Avapro yesterday and today and has not followed up with PCP recently    BP Readings from Last 3 Encounters:  10/15/19 (!) 140/100  06/14/19 108/72  05/20/19 (!) 138/92     LABS:  Appointment on 10/15/2019  Component Date Value Ref Range Status  . Cholesterol 10/15/2019 116  0 - 200 mg/dL Final   ATP III Classification       Desirable:  < 200 mg/dL               Borderline High:  200 - 239 mg/dL          High:  > = 147 mg/dL  . Triglycerides 10/15/2019 70.0  0 - 149 mg/dL Final   Normal:  <829 mg/dLBorderline High:  150 - 199 mg/dL  . HDL 10/15/2019 43.40  >39.00 mg/dL Final  . VLDL 56/21/3086 14.0  0.0 - 40.0 mg/dL Final  . LDL Cholesterol 10/15/2019 58  0 - 99 mg/dL Final  . Total CHOL/HDL Ratio 10/15/2019 3   Final                  Men          Women1/2 Average Risk     3.4          3.3Average Risk          5.0          4.42X Average Risk          9.6          7.13X Average Risk          15.0          11.0                      . NonHDL 10/15/2019 72.33   Final   NOTE:  Non-HDL goal should be 30 mg/dL higher than patient's LDL goal (i.e. LDL goal of < 70 mg/dL, would have non-HDL goal of < 100 mg/dL)  . Sodium 10/15/2019 140  135 - 145 mEq/L Final  . Potassium 10/15/2019 3.8  3.5 - 5.1 mEq/L Final  . Chloride 10/15/2019 103  96 - 112 mEq/L Final  . CO2 10/15/2019  30  19 - 32 mEq/L Final  . Glucose, Bld 10/15/2019 90  70 - 99 mg/dL Final  . BUN 57/84/6962 11  6 - 23 mg/dL Final  . Creatinine, Ser 10/15/2019 0.84  0.40 - 1.50 mg/dL Final  . Total Bilirubin 10/15/2019 1.4* 0.2 - 1.2 mg/dL Final  . Alkaline Phosphatase 10/15/2019 55  39 - 117 U/L Final  . AST 10/15/2019 16  0 - 37 U/L Final  . ALT 10/15/2019 21  0 - 53 U/L Final  . Total Protein 10/15/2019 7.7  6.0 - 8.3 g/dL Final  . Albumin 95/28/4132 4.6  3.5 - 5.2 g/dL Final  .  GFR 10/15/2019 118.34  >60.00 mL/min Final  . Calcium 10/15/2019 10.0  8.4 - 10.5 mg/dL Final  . Color, Urine 16/10/960409/11/2019 YELLOW  Yellow;Lt. Yellow;Straw;Dark Yellow;Amber;Green;Red;Brown Final  . APPearance 10/15/2019 CLEAR  Clear;Turbid;Slightly Cloudy;Cloudy Final  . Specific Gravity, Urine 10/15/2019 1.020  1.000 - 1.030 Final  . pH 10/15/2019 5.5  5.0 - 8.0 Final  . Total Protein, Urine 10/15/2019 NEGATIVE  Negative Final  . Urine Glucose 10/15/2019 >=1000* Negative Final  . Ketones, ur 10/15/2019 NEGATIVE  Negative Final  . Bilirubin Urine 10/15/2019 NEGATIVE  Negative Final  . Hgb urine dipstick 10/15/2019 NEGATIVE  Negative Final  . Urobilinogen, UA 10/15/2019 0.2  0.0 - 1.0 Final  . Leukocytes,Ua 10/15/2019 NEGATIVE  Negative Final  . Nitrite 10/15/2019 NEGATIVE  Negative Final  . WBC, UA 10/15/2019 0-2/hpf  0-2/hpf Final  . RBC / HPF 10/15/2019 none seen  0-2/hpf Final  . Microalb, Ur 10/15/2019 <0.7  0.0 - 1.9 mg/dL Final  . Creatinine,U 54/09/811909/11/2019 94.3  mg/dL Final  . Microalb Creat Ratio 10/15/2019 0.7  0.0 - 30.0 mg/g Final    Physical Examination:  BP (!) 140/100 (BP Location: Left Arm, Patient Position: Sitting, Cuff Size: Large)   Pulse 75   Ht 6' (1.829 m)   Wt (!) 306 lb (138.8 kg)   SpO2 96%   BMI 41.50 kg/m      ASSESSMENT/PLAN:  Diabetes type 2 with morbid obesity  See history of present illness for detailed discussion of his current management, blood sugar patterns and problems  identified  A1c is stable at 5.2  His A1c is likely falsely low He does need to continue his regimen of Rybelsus and Synjardy He may be having some polyuria from Jardiance and reassured him that this is not related to hyperglycemia and does not need to take insulin at this time   Recommendations:  Start exercise, needs at least 10 miles of walking a week  He will cut back on high calorie foods and desserts  Again reassured him that he does not need insulin since his overall control is still fairly good, likely does not need this now  Continue to monitor readings after meals at times  No change in medications  Call if blood sugars are consistently higher    HYPERTENSION: Blood pressure is higher from not taking Avapro for 2 days Also with his gaining weight and lack of exercise blood pressure is higher  He will go up to 300 mg on Avapro until he is seen by his PCP Since he has some polyuria he will hold off on restarting chlorthalidone  LIPIDS: He has had control on medication but needs follow-up labs  Frequent urination: He will discuss this with PCP  Again encouraged him to get the Covid vaccine and reassured him about the safety and need to have him protected because of his high risk status.  Also reminded him that it is now FDA approved   Patient Instructions  Take 2 Avapro daily  Check blood sugars on waking up days a week  Also check blood sugars about 2 hours after meals and do this after different meals by rotation  Recommended blood sugar levels on waking up are 90-130 and about 2 hours after meal is 130-160  Please bring your blood sugar monitor to each visit, thank you      Reather LittlerAjay Rukia Mcgillivray 10/15/2019, 4:23 PM   Note: This office note was prepared with Dragon voice recognition system technology. Any transcriptional errors that result  from this process are unintentional.

## 2019-11-22 ENCOUNTER — Other Ambulatory Visit: Payer: Self-pay

## 2019-11-22 ENCOUNTER — Encounter: Payer: Self-pay | Admitting: Internal Medicine

## 2019-11-22 ENCOUNTER — Ambulatory Visit (INDEPENDENT_AMBULATORY_CARE_PROVIDER_SITE_OTHER): Payer: BC Managed Care – PPO | Admitting: Internal Medicine

## 2019-11-22 ENCOUNTER — Other Ambulatory Visit: Payer: Self-pay | Admitting: Internal Medicine

## 2019-11-22 VITALS — BP 162/102 | HR 73 | Temp 98.8°F | Resp 16 | Ht 72.0 in | Wt 305.0 lb

## 2019-11-22 DIAGNOSIS — M170 Bilateral primary osteoarthritis of knee: Secondary | ICD-10-CM | POA: Diagnosis not present

## 2019-11-22 DIAGNOSIS — E139 Other specified diabetes mellitus without complications: Secondary | ICD-10-CM | POA: Diagnosis not present

## 2019-11-22 DIAGNOSIS — Z23 Encounter for immunization: Secondary | ICD-10-CM

## 2019-11-22 DIAGNOSIS — I10 Essential (primary) hypertension: Secondary | ICD-10-CM

## 2019-11-22 DIAGNOSIS — E785 Hyperlipidemia, unspecified: Secondary | ICD-10-CM | POA: Diagnosis not present

## 2019-11-22 DIAGNOSIS — Z794 Long term (current) use of insulin: Secondary | ICD-10-CM

## 2019-11-22 DIAGNOSIS — Z1211 Encounter for screening for malignant neoplasm of colon: Secondary | ICD-10-CM

## 2019-11-22 DIAGNOSIS — E1165 Type 2 diabetes mellitus with hyperglycemia: Secondary | ICD-10-CM

## 2019-11-22 MED ORDER — CHLORTHALIDONE 25 MG PO TABS: ORAL_TABLET | ORAL | 0 refills | Status: DC

## 2019-11-22 MED ORDER — ROSUVASTATIN CALCIUM 20 MG PO TABS: ORAL_TABLET | ORAL | 1 refills | Status: DC

## 2019-11-22 MED ORDER — GVOKE HYPOPEN 2-PACK 1 MG/0.2ML ~~LOC~~ SOAJ: 1.0000 | Freq: Every day | SUBCUTANEOUS | 5 refills | Status: DC | PRN

## 2019-11-22 MED ORDER — IRBESARTAN 150 MG PO TABS: ORAL_TABLET | ORAL | 1 refills | Status: DC

## 2019-11-22 NOTE — Progress Notes (Signed)
Subjective:  Patient ID: Tyler Benton, male    DOB: 10-07-1970  Age: 49 y.o. MRN: [id number]  CC: Hypertension and Diabetes  This visit occurred during the SARS-CoV-2 public health emergency.  Safety protocols were in place, including screening questions prior to the visit, additional usage of staff PPE, and extensive cleaning of exam room while observing appropriate contact time as indicated for disinfecting solutions.    HPI Tyler Benton presents for f/up - He is concerned that his blood pressure is not adequately well controlled.  He has not taken the ARB for 3 days.  He cannot recall the last time he took chlorthalidone.  Based on prescription refills this looks like it has been about 6 months.  He complains of chronic knee pain and wants to be referred to an orthopedist.  Outpatient Medications Prior to Visit  Medication Sig Dispense Refill  . glucose blood (ONETOUCH VERIO) test strip USE TO TEST BLOOD SUGAR ONCE DAILY AS DIRECTED 100 strip 2  . Insulin Lispro Prot & Lispro (HUMALOG 75/25 MIX) (75-25) 100 UNIT/ML Kwikpen INJECT 30 UNITS INTO THE SKIN DAILY BEFORE SUPPER, INJECT 32 UNITS UNDER THE SKIN THREE TIMES DAILY BEFORE MEALS IF BLOOD SUGAR IS OVER 130 30 mL 1  . Insulin Pen Needle 31G X 5 MM MISC Use on insulin pen 2x daily 100 each 1  . nabumetone (RELAFEN) 500 MG tablet TAKE 1 TABLET(500 MG) BY MOUTH TWICE DAILY AS NEEDED 180 tablet 0  . ONE TOUCH LANCETS MISC Use to test blood sugar once daily 200 each 1  . RYBELSUS 14 MG TABS TAKE 1 TABLET BY MOUTH EVERY DAY 30 tablet 3  . SYNJARDY XR 12.06-998 MG TB24 TAKE 2 TABLETS BY MOUTH DAILY WITH BREAKFAST 60 tablet 2  . irbesartan (AVAPRO) 150 MG tablet TAKE 1 TABLET(150 MG) BY MOUTH DAILY 90 tablet 1  . rosuvastatin (CRESTOR) 20 MG tablet TAKE 1 TABLET(20 MG) BY MOUTH DAILY 90 tablet 0  . chlorthalidone (HYGROTON) 25 MG tablet TAKE 1 TABLET(25 MG) BY MOUTH DAILY 90 tablet 0   No facility-administered medications prior to  visit.    ROS Review of Systems  Constitutional: Negative for appetite change, diaphoresis, fatigue and unexpected weight change.  HENT: Negative.   Eyes: Negative for visual disturbance.  Respiratory: Negative for cough, chest tightness, shortness of breath and wheezing.   Cardiovascular: Negative for chest pain, palpitations and leg swelling.  Gastrointestinal: Negative for abdominal pain, constipation, diarrhea, nausea and vomiting.  Endocrine: Negative.   Genitourinary: Negative.  Negative for difficulty urinating.  Musculoskeletal: Positive for arthralgias. Negative for myalgias.  Skin: Negative.  Negative for color change and pallor.  Neurological: Negative for dizziness, weakness, light-headedness and headaches.  Hematological: Negative for adenopathy. Does not bruise/bleed easily.  Psychiatric/Behavioral: Negative.     Objective:  BP (!) 162/102   Pulse 73   Temp 98.8 F (37.1 C) (Oral)   Resp 16   Ht 6' (1.829 m)   Wt (!) 305 lb (138.3 kg)   SpO2 95%   BMI 41.37 kg/m   BP Readings from Last 3 Encounters:  11/22/19 (!) 162/102  10/15/19 (!) 140/100  06/14/19 108/72    Wt Readings from Last 3 Encounters:  11/22/19 (!) 305 lb (138.3 kg)  10/15/19 (!) 306 lb (138.8 kg)  06/14/19 296 lb 3.2 oz (134.4 kg)    Physical Exam Vitals reviewed.  Constitutional:      Appearance: He is obese. He is not ill-appearing.  HENT:  Nose: Nose normal.     Mouth/Throat:     Mouth: Mucous membranes are moist.  Eyes:     General: No scleral icterus.    Conjunctiva/sclera: Conjunctivae normal.  Cardiovascular:     Rate and Rhythm: Normal rate and regular rhythm.     Heart sounds: No murmur heard.   Pulmonary:     Effort: Pulmonary effort is normal.     Breath sounds: No stridor. No wheezing, rhonchi or rales.  Abdominal:     General: Abdomen is protuberant. Bowel sounds are normal. There is no distension.     Palpations: Abdomen is soft. There is no hepatomegaly,  splenomegaly or mass.     Tenderness: There is no abdominal tenderness.  Musculoskeletal:        General: Normal range of motion.     Cervical back: Neck supple.     Right lower leg: No edema.     Left lower leg: No edema.  Lymphadenopathy:     Cervical: No cervical adenopathy.  Skin:    General: Skin is warm and dry.     Coloration: Skin is not pale.  Neurological:     General: No focal deficit present.     Mental Status: He is alert.  Psychiatric:        Mood and Affect: Mood normal.        Behavior: Behavior normal.     Lab Results  Component Value Date   WBC 7.2 05/20/2019   HGB 16.3 05/20/2019   HCT 48.3 05/20/2019   PLT 285.0 05/20/2019   GLUCOSE 90 10/15/2019   CHOL 116 10/15/2019   TRIG 70.0 10/15/2019   HDL 43.40 10/15/2019   LDLDIRECT 141.3 01/12/2014   LDLCALC 58 10/15/2019   ALT 21 10/15/2019   AST 16 10/15/2019   NA 140 10/15/2019   K 3.8 10/15/2019   CL 103 10/15/2019   CREATININE 0.84 10/15/2019   BUN 11 10/15/2019   CO2 30 10/15/2019   TSH 1.37 05/20/2019   PSA 0.37 05/20/2019   HGBA1C 5.2 10/15/2019   MICROALBUR <0.7 10/15/2019    DG Lumbar Spine Complete  Result Date: 02/12/2017 CLINICAL DATA:  Chronic low back pain, worse with standing. EXAM: LUMBAR SPINE - COMPLETE 4+ VIEW COMPARISON:  None. FINDINGS: Five lumbar type vertebral bodies show normal alignment. Mild disc space narrowing L4-5. Mild lower lumbar facet arthritis. No pars defect or slippage. Sacroiliac joints appear normal. IMPRESSION: Mild disc space narrowing L4-5.  No acute finding. Electronically Signed   By: Paulina Fusi M.D.   On: 02/12/2017 15:20   DG Shoulder Right  Result Date: 02/12/2017 CLINICAL DATA:  Shoulder pain after injury 4 months ago. EXAM: RIGHT SHOULDER - 2+ VIEW COMPARISON:  None. FINDINGS: Glenohumeral joint appears normal. Narrowing of the humeral acromial distance suggesting rotator cuff pathology. Greater tuberosity osteophyte formation. AC joint degenerative  change. IMPRESSION: No acute or traumatic finding. Narrowing of the humeral acromial distance. Greater tuberosity osteophyte formation. AC joint osteoarthritis. Electronically Signed   By: Paulina Fusi M.D.   On: 02/12/2017 15:21   [place] LIMITED JOINT SPACE STRUCTURES UP RIGHT  Result Date: 02/22/2017 [facility] US performed of: Right This study was ordered, performed, and interpreted by Terrilee Files D.O.  Shoulder:  Supraspinatus: Articular sided tear noted with what appears to be a very small Hill-Sachs deformity  bursal bulge seen with shoulder abduction on impingement view. Subscapularis:  Appears normal on long and transverse views. Positive bursa Teres Minor:  Appears normal  on long and transverse views. AC joint: Arthritic changes Glenohumeral Joint:  Appears normal without effusion. Glenoid Labrum:  Intact without visualized tears. Biceps Tendon:  Appears normal on long and transverse views, no fraying of tendon, tendon located in intertubercular groove, no subluxation with shoulder internal or external rotation.  Impression: Partial rotator cuff tear   Assessment & Plan:   Melvern was seen today for hypertension and diabetes.  Diagnoses and all orders for this visit:  Diabetes 1.5, managed as type 1 (HCC)- His recent A1c was down to 5.2%.  Glycemic agents are prescribed by endocrinology. -     Glucagon (GVOKE HYPOPEN 2-PACK) 1 MG/0.2ML SOAJ; Inject 1 Act into the skin daily as needed. -     irbesartan (AVAPRO) 150 MG tablet; TAKE 1 TABLET(150 MG) BY MOUTH DAILY -     Ambulatory referral to Ophthalmology  Colon cancer screening -     Cologuard  Primary osteoarthritis of both knees -     Ambulatory referral to Orthopedic Surgery  Essential hypertension- His blood pressure is not adequately well controlled.  Will restart the ARB and thiazide diuretic. -     chlorthalidone (HYGROTON) 25 MG tablet; TAKE 1 TABLET(25 MG) BY MOUTH DAILY -     irbesartan (AVAPRO) 150 MG tablet; TAKE 1 TABLET(150 MG)  BY MOUTH DAILY  Uncontrolled type 2 diabetes mellitus with hyperglycemia, with long-term current use of insulin (HCC)  Hyperlipidemia LDL goal <100- He is doing well on the statin is achieved his LDL goal. -     rosuvastatin (CRESTOR) 20 MG tablet; TAKE 1 TABLET(20 MG) BY MOUTH DAILY  Other orders -     Flu Vaccine QUAD 6+ mos PF IM (Fluarix Quad PF) -     Pneumococcal conjugate vaccine 13-valent   I am having Emmett L. Ladona Ridgel "Eddy" start on [place] HypoPen 2-Pack. I am also having him maintain his ONE TOUCH LANCETS, Insulin Pen Needle, OneTouch Verio, Rybelsus, Insulin Lispro Prot & Lispro, nabumetone, Synjardy XR, chlorthalidone, irbesartan, and rosuvastatin.  Meds ordered this encounter  Medications  . Glucagon (GVOKE HYPOPEN 2-PACK) 1 MG/0.2ML SOAJ    Sig: Inject 1 Act into the skin daily as needed.    Dispense:  2 mL    Refill:  5  . chlorthalidone (HYGROTON) 25 MG tablet    Sig: TAKE 1 TABLET(25 MG) BY MOUTH DAILY    Dispense:  90 tablet    Refill:  0  . irbesartan (AVAPRO) 150 MG tablet    Sig: TAKE 1 TABLET(150 MG) BY MOUTH DAILY    Dispense:  90 tablet    Refill:  1  . rosuvastatin (CRESTOR) 20 MG tablet    Sig: TAKE 1 TABLET(20 MG) BY MOUTH DAILY    Dispense:  90 tablet    Refill:  1     Follow-up: Return in about 6 weeks (around 01/03/2020).  Sanda Linger, MD

## 2019-11-22 NOTE — Patient Instructions (Signed)

## 2019-11-23 ENCOUNTER — Encounter: Payer: Self-pay | Admitting: Internal Medicine

## 2019-11-29 ENCOUNTER — Ambulatory Visit: Payer: BC Managed Care – PPO | Admitting: Orthopedic Surgery

## 2019-12-03 ENCOUNTER — Other Ambulatory Visit: Payer: Self-pay | Admitting: Internal Medicine

## 2019-12-03 DIAGNOSIS — M5136 Other intervertebral disc degeneration, lumbar region: Secondary | ICD-10-CM

## 2019-12-03 DIAGNOSIS — M170 Bilateral primary osteoarthritis of knee: Secondary | ICD-10-CM

## 2019-12-08 ENCOUNTER — Other Ambulatory Visit: Payer: Self-pay | Admitting: Endocrinology

## 2019-12-13 ENCOUNTER — Ambulatory Visit: Payer: Self-pay

## 2019-12-13 ENCOUNTER — Ambulatory Visit (INDEPENDENT_AMBULATORY_CARE_PROVIDER_SITE_OTHER): Payer: BC Managed Care – PPO | Admitting: Orthopedic Surgery

## 2019-12-13 ENCOUNTER — Encounter: Payer: Self-pay | Admitting: Orthopedic Surgery

## 2019-12-13 ENCOUNTER — Other Ambulatory Visit: Payer: Self-pay | Admitting: Endocrinology

## 2019-12-13 DIAGNOSIS — M25562 Pain in left knee: Secondary | ICD-10-CM

## 2019-12-13 DIAGNOSIS — G8929 Other chronic pain: Secondary | ICD-10-CM

## 2019-12-13 DIAGNOSIS — M545 Low back pain, unspecified: Secondary | ICD-10-CM

## 2019-12-13 DIAGNOSIS — M25561 Pain in right knee: Secondary | ICD-10-CM

## 2019-12-13 MED ORDER — NABUMETONE 750 MG PO TABS: 750.0000 mg | ORAL_TABLET | Freq: Two times a day (BID) | ORAL | 3 refills | Status: DC | PRN

## 2019-12-13 NOTE — Progress Notes (Signed)
Office Visit Note   Patient: Tyler Benton           Date of Birth: 08-26-1970           MRN: [id number] Visit Date: 12/13/2019              Requested by: Etta Grandchild, MD [address] [place],  [place] [address] PCP: Etta Grandchild, MD  Chief Complaint  Patient presents with  . Right Knee - Pain  . Left Knee - Pain      HPI: Patient is a 49 year old gentleman who presents complaining of bilateral knee pain for a year he states the pain is anteriorly to the patella has start up stiffness decreased range of motion denies any effusion he states he does have crepitation but denies any locking or giving way.  Patient also complains of lower back pain he states he has to lean on his forearm when cooking and doing dishes.  He denies any radicular symptoms denies any weakness denies any numbness or tingling.  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  Assessment & Plan: Visit Diagnoses:  1. Chronic pain of both knees   2. Chronic midline low back pain without sciatica     Plan: Will call in a prescription for Relafen and we will also set him up for physical therapy for core strengthening for his back and quad and hamstring strengthening for the knees.  Recommended close chain kinetic exercises.  Follow-Up Instructions: Return if symptoms worsen or fail to improve.   Ortho Exam  Patient is alert, oriented, no adenopathy, well-dressed, normal affect, normal respiratory effort. Examination patient has an antalgic gait he walks with valgus alignment of both knees he has crepitation with range of motion the patellofemoral joint there is no effusion collaterals and cruciates are stable medial lateral joint line are nontender to palpation.  He is tender to palpation the medial lateral facets of the patella.  He has a negative straight leg raise bilaterally with no focal motor weakness.  Imaging: No results found. No images are attached  to the encounter.  Labs: Lab Results  Component Value Date   HGBA1C 5.2 10/15/2019   HGBA1C 5.8 05/20/2019   HGBA1C 5.5 02/09/2019     Lab Results  Component Value Date   ALBUMIN 4.6 10/15/2019   ALBUMIN 4.3 02/09/2019   ALBUMIN 4.3 08/05/2018    No results found for: MG No results found for: VD25OH  No results found for: PREALBUMIN CBC EXTENDED Latest Ref Rng & Units 05/20/2019 11/20/2016 09/24/2013  WBC 4.0 - 10.5 K/uL 7.2 5.3 6.7  RBC 4.22 - 5.81 Mil/uL 5.00 4.69 5.03  HGB 13.0 - 17.0 g/dL 16.0 73.7 10.6  HCT 39 - 52 % 48.3 46.0 48.6  PLT 150 - 400 K/uL 285.0 268.0 285.0  NEUTROABS 1.4 - 7.7 K/uL 4.5 2.8 4.2  LYMPHSABS 0.7 - 4.0 K/uL 1.8 1.4 1.7     There is no height or weight on file to calculate BMI.  Orders:  Orders Placed This Encounter  Procedures  . XR Knee 1-2 Views Right  . XR Knee 1-2 Views Left  . XR Lumbar Spine 2-3 Views  . Ambulatory referral to Physical Therapy   Meds ordered this encounter  Medications  . nabumetone (RELAFEN) 750 MG tablet    Sig: Take 1 tablet (750 mg total) by mouth 2 (two) times daily as needed for mild pain or moderate pain.  with food    Dispense:  60 tablet    Refill:  3     Procedures: No procedures performed  Clinical Data: No additional findings.  ROS:  All other systems negative, except as noted in the HPI. Review of Systems  Objective: Vital Signs: There were no vitals taken for this visit.  Specialty Comments:  No specialty comments available.  PMFS History: Patient Active Problem List   Diagnosis Date Noted  . Primary osteoarthritis of both knees 05/20/2019  . Chronic eczematous otitis externa of both ears 05/12/2018  . Uncontrolled type 2 diabetes mellitus with hyperglycemia, with long-term current use of insulin (HCC) 05/11/2018  . Essential hypertension 01/08/2017  . DDD (degenerative disc disease), lumbar 01/08/2017  . Hyperlipidemia LDL goal <100 01/08/2017  . Type II diabetes mellitus with  manifestations (HCC) 09/24/2013  . Plantar fasciitis, bilateral 09/24/2013  . Snoring 09/24/2013  . Routine general medical examination at a health care facility 05/29/2012  . Severe obesity (BMI >= 40) (HCC) 05/29/2012   Past Medical History:  Diagnosis Date  . Diabetes mellitus without complication (HCC)   . GERD (gastroesophageal reflux disease)   . Obesity     Family History  Problem Relation Age of Onset  . Heart disease Mother   . Hypertension Mother   . Early death Neg Hx   . Alcohol abuse Neg Hx   . Birth defects Neg Hx   . Cancer Neg Hx   . COPD Neg Hx   . Drug abuse Neg Hx   . Hyperlipidemia Neg Hx   . Kidney disease Neg Hx   . Stroke Neg Hx   . Thyroid disease Neg Hx   . Diabetes Maternal Grandfather     Past Surgical History:  Procedure Laterality Date  . GROWTH PLATE SURGERY Bilateral 1980s  . WISDOM TOOTH EXTRACTION     Social History   Occupational History  . 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 Occupation: Paramedic  Tobacco Use  . Smoking status: Never Smoker  . Smokeless tobacco: Never Used  Substance and Sexual Activity  . Alcohol use: No  . Drug use: No  . Sexual activity: Not Currently

## 2019-12-24 ENCOUNTER — Telehealth: Payer: Self-pay

## 2019-12-24 NOTE — Telephone Encounter (Signed)
Key: [id number]

## 2019-12-27 DIAGNOSIS — Z1211 Encounter for screening for malignant neoplasm of colon: Secondary | ICD-10-CM | POA: Diagnosis not present

## 2019-12-28 ENCOUNTER — Ambulatory Visit: Payer: BC Managed Care – PPO | Admitting: Physical Therapy

## 2019-12-29 ENCOUNTER — Other Ambulatory Visit: Payer: Self-pay

## 2019-12-29 ENCOUNTER — Telehealth: Payer: Self-pay | Admitting: Endocrinology

## 2019-12-29 MED ORDER — RYBELSUS 14 MG PO TABS
1.0000 | ORAL_TABLET | Freq: Every day | ORAL | 3 refills | Status: DC
Start: 2019-12-29 — End: 2020-07-26

## 2019-12-29 NOTE — Telephone Encounter (Signed)
Pt reports one week supply left of RYBELSUS. [facility] [place].

## 2019-12-29 NOTE — Telephone Encounter (Signed)
I submitted RX to pharmacy.

## 2020-01-03 ENCOUNTER — Encounter: Payer: Self-pay | Admitting: Internal Medicine

## 2020-01-03 ENCOUNTER — Ambulatory Visit (INDEPENDENT_AMBULATORY_CARE_PROVIDER_SITE_OTHER): Payer: BC Managed Care – PPO | Admitting: Internal Medicine

## 2020-01-03 ENCOUNTER — Other Ambulatory Visit: Payer: Self-pay

## 2020-01-03 VITALS — BP 126/84 | HR 89 | Temp 98.7°F | Resp 16 | Ht 72.0 in | Wt 302.0 lb

## 2020-01-03 DIAGNOSIS — I10 Essential (primary) hypertension: Secondary | ICD-10-CM

## 2020-01-03 DIAGNOSIS — N5201 Erectile dysfunction due to arterial insufficiency: Secondary | ICD-10-CM | POA: Diagnosis not present

## 2020-01-03 DIAGNOSIS — E118 Type 2 diabetes mellitus with unspecified complications: Secondary | ICD-10-CM

## 2020-01-03 MED ORDER — SILDENAFIL CITRATE 20 MG PO TABS: 80.0000 mg | ORAL_TABLET | Freq: Every day | ORAL | 5 refills | Status: DC | PRN

## 2020-01-03 NOTE — Patient Instructions (Signed)

## 2020-01-03 NOTE — Progress Notes (Signed)
Subjective:  Patient ID: Tyler Benton, male    DOB: Dec 02, 1970  Age: 49 y.o. MRN: [id number]  CC: Hypertension and Diabetes  This visit occurred during the SARS-CoV-2 public health emergency.  Safety protocols were in place, including screening questions prior to the visit, additional usage of staff PPE, and extensive cleaning of exam room while observing appropriate contact time as indicated for disinfecting solutions.    HPI Tyler Benton presents for f/up - He tells me his blood pressure and blood sugar are well controlled.  He denies any recent episodes of dizziness, lightheadedness, chest pain, shortness of breath, or edema.  He does complain of erectile dysfunction.  Outpatient Medications Prior to Visit  Medication Sig Dispense Refill  . chlorthalidone (HYGROTON) 25 MG tablet TAKE 1 TABLET(25 MG) BY MOUTH DAILY 90 tablet 0  . Glucagon (GVOKE HYPOPEN 2-PACK) 1 MG/0.2ML SOAJ Inject 1 Act into the skin daily as needed. 2 mL 5  . glucose blood (ONETOUCH VERIO) test strip USE TO TEST BLOOD SUGAR ONCE DAILY AS DIRECTED 100 strip 2  . Insulin Lispro Prot & Lispro (HUMALOG 75/25 MIX) (75-25) 100 UNIT/ML Kwikpen INJECT 30 UNITS INTO THE SKIN DAILY BEFORE SUPPER, INJECT 32 UNITS UNDER THE SKIN THREE TIMES DAILY BEFORE MEALS IF BLOOD SUGAR IS OVER 130 30 mL 1  . Insulin Pen Needle 31G X 5 MM MISC Use on insulin pen 2x daily 100 each 1  . irbesartan (AVAPRO) 150 MG tablet TAKE 1 TABLET(150 MG) BY MOUTH DAILY 90 tablet 1  . nabumetone (RELAFEN) 750 MG tablet Take 1 tablet (750 mg total) by mouth 2 (two) times daily as needed for mild pain or moderate pain. with food 60 tablet 3  . ONE TOUCH LANCETS MISC Use to test blood sugar once daily 200 each 1  . rosuvastatin (CRESTOR) 20 MG tablet TAKE 1 TABLET(20 MG) BY MOUTH DAILY 90 tablet 1  . Semaglutide (RYBELSUS) 14 MG TABS Take 1 tablet by mouth daily. 30 tablet 3  . SYNJARDY XR 12.06-998 MG TB24 TAKE 2 TABLETS BY MOUTH DAILY WITH BREAKFAST 60  tablet 2  . nabumetone (RELAFEN) 500 MG tablet TAKE 1 TABLET(500 MG) BY MOUTH TWICE DAILY AS NEEDED 180 tablet 0   No facility-administered medications prior to visit.    ROS Review of Systems  Constitutional: Negative.  Negative for appetite change, diaphoresis, fatigue and unexpected weight change.  HENT: Negative.   Eyes: Negative for visual disturbance.  Respiratory: Negative for cough, chest tightness, shortness of breath and wheezing.   Cardiovascular: Negative for chest pain, palpitations and leg swelling.  Gastrointestinal: Negative for abdominal pain, constipation, diarrhea, nausea and vomiting.  Endocrine: Negative.   Genitourinary: Negative for difficulty urinating.       +ED  Musculoskeletal: Negative for arthralgias, joint swelling and myalgias.  Skin: Negative.  Negative for color change, pallor and rash.  Neurological: Negative.  Negative for dizziness, weakness and numbness.  Hematological: Negative for adenopathy. Does not bruise/bleed easily.  Psychiatric/Behavioral: Negative.     Objective:  BP 126/84   Pulse 89   Temp 98.7 F (37.1 C) (Oral)   Resp 16   Ht 6' (1.829 m)   Wt (!) 302 lb (137 kg)   SpO2 95%   BMI 40.96 kg/m   BP Readings from Last 3 Encounters:  01/03/20 126/84  11/22/19 (!) 162/102  10/15/19 (!) 140/100    Wt Readings from Last 3 Encounters:  01/03/20 (!) 302 lb (137 kg)  11/22/19 Marland Kitchen)  305 lb (138.3 kg)  10/15/19 (!) 306 lb (138.8 kg)    Physical Exam Vitals reviewed.  Constitutional:      Appearance: He is obese.  HENT:     Nose: Nose normal.     Mouth/Throat:     Mouth: Mucous membranes are moist.  Eyes:     General: No scleral icterus.    Conjunctiva/sclera: Conjunctivae normal.  Cardiovascular:     Rate and Rhythm: Normal rate and regular rhythm.     Heart sounds: No murmur heard.   Pulmonary:     Effort: Pulmonary effort is normal.     Breath sounds: No stridor. No wheezing, rhonchi or rales.  Abdominal:      General: Abdomen is protuberant. Bowel sounds are normal. There is no distension.     Palpations: Abdomen is soft. There is no hepatomegaly, splenomegaly or mass.     Tenderness: There is no abdominal tenderness.  Musculoskeletal:        General: Normal range of motion.     Cervical back: Neck supple.     Right lower leg: No edema.     Left lower leg: No edema.  Lymphadenopathy:     Cervical: No cervical adenopathy.  Skin:    General: Skin is warm and dry.  Neurological:     General: No focal deficit present.     Mental Status: He is alert.  Psychiatric:        Mood and Affect: Mood normal.        Behavior: Behavior normal.     Lab Results  Component Value Date   WBC 7.2 05/20/2019   HGB 16.3 05/20/2019   HCT 48.3 05/20/2019   PLT 285.0 05/20/2019   GLUCOSE 90 10/15/2019   CHOL 116 10/15/2019   TRIG 70.0 10/15/2019   HDL 43.40 10/15/2019   LDLDIRECT 141.3 01/12/2014   LDLCALC 58 10/15/2019   ALT 21 10/15/2019   AST 16 10/15/2019   NA 140 10/15/2019   K 3.8 10/15/2019   CL 103 10/15/2019   CREATININE 0.84 10/15/2019   BUN 11 10/15/2019   CO2 30 10/15/2019   TSH 1.37 05/20/2019   PSA 0.37 05/20/2019   HGBA1C 5.2 10/15/2019   MICROALBUR <0.7 10/15/2019    DG Lumbar Spine Complete  Result Date: 02/12/2017 CLINICAL DATA:  Chronic low back pain, worse with standing. EXAM: LUMBAR SPINE - COMPLETE 4+ VIEW COMPARISON:  None. FINDINGS: Five lumbar type vertebral bodies show normal alignment. Mild disc space narrowing L4-5. Mild lower lumbar facet arthritis. No pars defect or slippage. Sacroiliac joints appear normal. IMPRESSION: Mild disc space narrowing L4-5.  No acute finding. Electronically Signed   By: Paulina Fusi M.D.   On: 02/12/2017 15:20   DG Shoulder Right  Result Date: 02/12/2017 CLINICAL DATA:  Shoulder pain after injury 4 months ago. EXAM: RIGHT SHOULDER - 2+ VIEW COMPARISON:  None. FINDINGS: Glenohumeral joint appears normal. Narrowing of the humeral acromial  distance suggesting rotator cuff pathology. Greater tuberosity osteophyte formation. AC joint degenerative change. IMPRESSION: No acute or traumatic finding. Narrowing of the humeral acromial distance. Greater tuberosity osteophyte formation. AC joint osteoarthritis. Electronically Signed   By: Paulina Fusi M.D.   On: 02/12/2017 15:21   [place] LIMITED JOINT SPACE STRUCTURES UP RIGHT  Result Date: 02/22/2017 [facility] US performed of: Right This study was ordered, performed, and interpreted by Terrilee Files D.O.  Shoulder:  Supraspinatus: Articular sided tear noted with what appears to be a very small Hill-Sachs deformity  bursal bulge seen with shoulder abduction on impingement view. Subscapularis:  Appears normal on long and transverse views. Positive bursa Teres Minor:  Appears normal on long and transverse views. AC joint: Arthritic changes Glenohumeral Joint:  Appears normal without effusion. Glenoid Labrum:  Intact without visualized tears. Biceps Tendon:  Appears normal on long and transverse views, no fraying of tendon, tendon located in intertubercular groove, no subluxation with shoulder internal or external rotation.  Impression: Partial rotator cuff tear   Assessment & Plan:   Aashish was seen today for hypertension and diabetes.  Diagnoses and all orders for this visit:  Erectile dysfunction due to arterial insufficiency -     sildenafil (REVATIO) 20 MG tablet; Take 4 tablets (80 mg total) by mouth daily as needed.  Essential hypertension- His blood pressure is adequately well controlled.  Type II diabetes mellitus with manifestations (HCC)- His recent A1c was 5.2%.  His glycemic agents are managed by endocrinology.   I am having Jaeveon L. [employer]" start on sildenafil. I am also having him maintain his ONE TOUCH LANCETS, Insulin Pen Needle, OneTouch Verio, Insulin Lispro Prot & Lispro, Gvoke HypoPen 2-Pack, chlorthalidone, irbesartan, rosuvastatin, Synjardy XR, nabumetone, and  Rybelsus.  Meds ordered this encounter  Medications  . sildenafil (REVATIO) 20 MG tablet    Sig: Take 4 tablets (80 mg total) by mouth daily as needed.    Dispense:  60 tablet    Refill:  5     Follow-up: Return in about 6 months (around 07/02/2020).  Sanda Linger, MD

## 2020-01-11 LAB — COLOGUARD
COLOGUARD: NEGATIVE
Cologuard: NEGATIVE
Cologuard: NEGATIVE

## 2020-01-11 LAB — EXTERNAL GENERIC LAB PROCEDURE: COLOGUARD: NEGATIVE

## 2020-01-12 ENCOUNTER — Other Ambulatory Visit: Payer: Self-pay | Admitting: Orthopedic Surgery

## 2020-01-12 NOTE — Telephone Encounter (Signed)
Pt in office 12/13/19 bilat knee pain

## 2020-01-13 ENCOUNTER — Other Ambulatory Visit: Payer: Self-pay | Admitting: Endocrinology

## 2020-01-13 ENCOUNTER — Other Ambulatory Visit: Payer: Self-pay | Admitting: Orthopedic Surgery

## 2020-01-21 ENCOUNTER — Other Ambulatory Visit: Payer: Self-pay | Admitting: Endocrinology

## 2020-01-21 ENCOUNTER — Other Ambulatory Visit: Payer: Self-pay | Admitting: Internal Medicine

## 2020-01-21 DIAGNOSIS — N5201 Erectile dysfunction due to arterial insufficiency: Secondary | ICD-10-CM

## 2020-01-22 ENCOUNTER — Other Ambulatory Visit: Payer: Self-pay | Admitting: Internal Medicine

## 2020-01-22 DIAGNOSIS — N5201 Erectile dysfunction due to arterial insufficiency: Secondary | ICD-10-CM

## 2020-01-22 MED ORDER — SILDENAFIL CITRATE 20 MG PO TABS: 80.0000 mg | ORAL_TABLET | Freq: Every day | ORAL | 5 refills | Status: DC | PRN

## 2020-01-24 ENCOUNTER — Encounter: Payer: Self-pay | Admitting: Physical Therapy

## 2020-01-24 ENCOUNTER — Other Ambulatory Visit: Payer: Self-pay

## 2020-01-24 ENCOUNTER — Ambulatory Visit (INDEPENDENT_AMBULATORY_CARE_PROVIDER_SITE_OTHER): Payer: BC Managed Care – PPO | Admitting: Physical Therapy

## 2020-01-24 DIAGNOSIS — M25562 Pain in left knee: Secondary | ICD-10-CM

## 2020-01-24 DIAGNOSIS — M6281 Muscle weakness (generalized): Secondary | ICD-10-CM

## 2020-01-24 DIAGNOSIS — G8929 Other chronic pain: Secondary | ICD-10-CM

## 2020-01-24 DIAGNOSIS — M25561 Pain in right knee: Secondary | ICD-10-CM

## 2020-01-24 DIAGNOSIS — M545 Low back pain, unspecified: Secondary | ICD-10-CM | POA: Diagnosis not present

## 2020-01-24 NOTE — Patient Instructions (Signed)
Access Code: [email or web address] URL: [email or web address] Date: 01/24/2020 Prepared by: Reggy Eye  Exercises Supine Bridge - 1 x daily - 7 x weekly - 3 sets - 10 reps Small Range Straight Leg Raise - 1 x daily - 7 x weekly - 3 sets - 10 reps Hooklying Hamstring Stretch with Strap - 1 x daily - 7 x weekly - 3 sets - 1 reps - 20-30 seconds hold Sidelying Hip Abduction - 1 x daily - 7 x weekly - 3 sets - 10 reps Seated Piriformis Stretch - 1 x daily - 7 x weekly - 3 sets - 1 reps - 20-30 seconds hold

## 2020-01-24 NOTE — Therapy (Signed)
[facility] [place] [address] [place], [place], [address] Phone: [phone]   Fax:  [phone]  Physical Therapy Evaluation  Patient Details  Name: Tyler Benton MRN: [id number] Date of Birth: [birth year] Referring Provider (PT): Bettey Mare Date: 01/24/2020   PT End of Session - 01/24/20 1029    Visit Number 1    Number of Visits 12    Date for PT Re-Evaluation 03/06/20    PT Start Time 0940    PT Stop Time 1025    PT Time Calculation (min) 45 min    Activity Tolerance Patient tolerated treatment well    Behavior During Therapy [facility] for tasks assessed/performed           Past Medical History:  Diagnosis Date  . Diabetes mellitus without complication (HCC)   . GERD (gastroesophageal reflux disease)   . Obesity     Past Surgical History:  Procedure Laterality Date  . GROWTH PLATE SURGERY Bilateral 1980s  . WISDOM TOOTH EXTRACTION      There were no vitals filed for this visit.    Subjective Assessment - 01/24/20 0945    Subjective Pt states he has been having knee and back pain for several months, worsening in the past 1-2 months. 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 Pt states MD performed xrays which show arthritis in bilat knees and back. Pain increases with prolonged standing, sitting with knees in flexed position. Pain improves with stretching knees and OTC meds    Pertinent History none    Limitations Sitting;Standing    How long can you sit comfortably? less than 5 minutes with knees flexed    How long can you stand comfortably? 10-15 minutes    Diagnostic tests x rays show arthritis    Patient Stated Goals decrease pain in back and knees    Currently in Pain? No/denies              [facility] PT Assessment - 01/24/20 0001      Assessment   Medical Diagnosis bilateral knee pain, back pain without sciatica    Referring Provider (PT) Lajoyce Corners    Onset  Date/Surgical Date 11/08/19      Balance Screen   Has the patient fallen in the past 6 months No      ROM / Strength   AROM / PROM / Strength AROM;Strength      AROM   AROM Assessment Site Knee;Lumbar    Right/Left Knee Right;Left    Right Knee Extension 0    Right Knee Flexion 110    Left Knee Extension 0    Left Knee Flexion 118    Lumbar Flexion limited 25%    Lumbar Extension WFL    Lumbar - Right Side Bend [facility]    Lumbar - Left Side Bend WFL    Lumbar - Right Rotation [facility]    Lumbar - Left Rotation [facility]      Strength   Strength Assessment Site Knee;Lumbar;Hip    Right/Left Hip Right;Left    Right Hip Flexion 3+/5    Right Hip Extension 4/5    Right Hip ABduction 4-/5    Left Hip Flexion 4/5    Left Hip Extension 4/5    Left Hip ABduction 4/5    Right/Left Knee Right;Left    Right Knee Flexion 4/5    Right Knee Extension 4/5    Left Knee Flexion  4+/5    Left Knee Extension 4+/5      Flexibility   Soft Tissue Assessment /Muscle Length yes    Hamstrings decreased to <90    Piriformis decreased      Palpation   Patella mobility crepitus Rt patella with mobs all directions, Lt WFL    Palpation comment no TTP lumbar spinal mm                      Objective measurements completed on examination: See above findings.               PT Education - 01/24/20 1029    Education Details HEP, PT POC and goals    Person(s) Educated Patient    Methods Demonstration;Explanation;Handout    Comprehension Returned demonstration;Verbalized understanding               PT Long Term Goals - 01/24/20 1031      PT LONG TERM GOAL #1   Title Pt will be independent with HEP for strength and flexibility    Time 6    Period Weeks    Status New    Target Date 03/06/20      PT LONG TERM GOAL #2   Title Pt will improve bilat knee and hip strength to 4+/5 to perform standing and sitting tasks without increase in pain    Time 6    Period Weeks    Status  New    Target Date 03/06/20      PT LONG TERM GOAL #3   Title Pt will be able to sit in knee flexed position x 5 minutes with no increase in pain    Time 6    Period Weeks    Status New    Target Date 03/06/20      PT LONG TERM GOAL #4   Title Pt will improve core strength to stand while washing dishes x 10 minutes with no increase in pain    Time 6    Period Weeks    Status New    Target Date 03/06/20                  Plan - 01/24/20 1013    Clinical Impression Statement Pt presents with decreased strength, mobility and increased pain and will benefit from skilled PT to address deficits and improve functional mobility    Personal Factors and Comorbidities Comorbidity 2;Profession    Examination-Activity Limitations Sit;Stand    Examination-Participation Restrictions Occupation;Community Activity    Stability/Clinical Decision Making Evolving/Moderate complexity    Clinical Decision Making Moderate    Rehab Potential Good    PT Frequency 1x / week    PT Duration 6 weeks    PT Treatment/Interventions Aquatic Therapy;Cryotherapy;Civil engineer, contracting;Taping;Dry needling;Patient/family education;Iontophoresis 4mg /ml Dexamethasone;Moist Heat;Neuromuscular re-education;Therapeutic exercise;Therapeutic activities    PT Next Visit Plan assess HEP and progress exercises    PT Home Exercise Plan [email or web address]    Consulted and Agree with Plan of Care Patient           Patient will benefit from skilled therapeutic intervention in order to improve the following deficits and impairments:  Decreased range of motion,Decreased activity tolerance,Decreased mobility,Decreased strength,Hypomobility,Pain  Visit Diagnosis: Chronic midline low back pain without sciatica - Plan: PT plan of care cert/re-cert  Muscle weakness (generalized) - Plan: PT plan of care cert/re-cert  Chronic pain of left knee - Plan: PT plan of care cert/re-cert  Chronic pain of right knee -  Plan: PT plan of care cert/re-cert     Problem List Patient Active Problem List   Diagnosis Date Noted  . Erectile dysfunction due to arterial insufficiency 01/03/2020  . Primary osteoarthritis of both knees 05/20/2019  . Chronic eczematous otitis externa of both ears 05/12/2018  . Uncontrolled type 2 diabetes mellitus with hyperglycemia, with long-term current use of insulin (HCC) 05/11/2018  . Essential hypertension 01/08/2017  . DDD (degenerative disc disease), lumbar 01/08/2017  . Hyperlipidemia LDL goal <100 01/08/2017  . Type II diabetes mellitus with manifestations (HCC) 09/24/2013  . Plantar fasciitis, bilateral 09/24/2013  . Snoring 09/24/2013  . Routine general medical examination at a health care facility 05/29/2012  . Severe obesity (BMI >= 40) (HCC) 05/29/2012   Marwin Primmer, PT   Kyara Boxer 01/24/2020, 10:39 AM  [facility] [address] [place] [address] [place], [place], [address] Phone: [phone]   Fax:  [phone]  Name: Tyler Benton MRN: [id number] Date of Birth: 16-Sep-1970

## 2020-01-31 ENCOUNTER — Telehealth: Payer: Self-pay | Admitting: Physical Therapy

## 2020-01-31 ENCOUNTER — Encounter: Payer: BC Managed Care – PPO | Admitting: Physical Therapy

## 2020-01-31 NOTE — Telephone Encounter (Signed)
Left message for pt regarding missed appointment today.  Reminded of next appointment and attendance policy Roderic Scarce, PT 01/31/20 9:47 AM

## 2020-02-02 ENCOUNTER — Encounter: Payer: BC Managed Care – PPO | Admitting: Physical Therapy

## 2020-02-08 ENCOUNTER — Encounter: Payer: BC Managed Care – PPO | Admitting: Physical Therapy

## 2020-02-12 ENCOUNTER — Other Ambulatory Visit: Payer: Self-pay | Admitting: Orthopedic Surgery

## 2020-02-14 ENCOUNTER — Ambulatory Visit: Payer: BC Managed Care – PPO | Admitting: Endocrinology

## 2020-02-15 NOTE — Telephone Encounter (Signed)
Dr. Lajoyce Corners gave rx 12/2019 do you wish to refill/

## 2020-02-20 ENCOUNTER — Other Ambulatory Visit: Payer: Self-pay | Admitting: Internal Medicine

## 2020-02-20 DIAGNOSIS — I10 Essential (primary) hypertension: Secondary | ICD-10-CM

## 2020-03-08 ENCOUNTER — Other Ambulatory Visit: Payer: Self-pay

## 2020-03-08 ENCOUNTER — Encounter: Payer: Self-pay | Admitting: Endocrinology

## 2020-03-08 ENCOUNTER — Ambulatory Visit (INDEPENDENT_AMBULATORY_CARE_PROVIDER_SITE_OTHER): Payer: BC Managed Care – PPO | Admitting: Endocrinology

## 2020-03-08 VITALS — BP 140/98 | HR 85 | Ht 72.0 in | Wt 311.6 lb

## 2020-03-08 DIAGNOSIS — I10 Essential (primary) hypertension: Secondary | ICD-10-CM | POA: Diagnosis not present

## 2020-03-08 DIAGNOSIS — E669 Obesity, unspecified: Secondary | ICD-10-CM | POA: Diagnosis not present

## 2020-03-08 DIAGNOSIS — E1169 Type 2 diabetes mellitus with other specified complication: Secondary | ICD-10-CM | POA: Diagnosis not present

## 2020-03-08 LAB — POCT GLYCOSYLATED HEMOGLOBIN (HGB A1C): Hemoglobin A1C: 5.4 % (ref 4.0–5.6)

## 2020-03-08 LAB — BASIC METABOLIC PANEL
BUN: 9 mg/dL (ref 6–23)
CO2: 30 mEq/L (ref 19–32)
Calcium: 10.1 mg/dL (ref 8.4–10.5)
Chloride: 102 mEq/L (ref 96–112)
Creatinine, Ser: 0.88 mg/dL (ref 0.40–1.50)
GFR: 102.24 mL/min (ref >60.00)
Glucose, Bld: 101 mg/dL — ABNORMAL HIGH (ref 70–99)
Potassium: 3.9 mEq/L (ref 3.5–5.1)
Sodium: 138 mEq/L (ref 135–145)

## 2020-03-08 MED ORDER — AMLODIPINE BESYLATE 5 MG PO TABS: 5.0000 mg | ORAL_TABLET | Freq: Every day | ORAL | 3 refills | Status: DC

## 2020-03-08 MED ORDER — HYDROCHLOROTHIAZIDE 12.5 MG PO CAPS: 12.5000 mg | ORAL_CAPSULE | Freq: Every day | ORAL | 1 refills | Status: DC

## 2020-03-08 NOTE — Progress Notes (Signed)
Patient ID: Tyler Benton, male   DOB: [birth year], 50 y.o.   MRN: [id number]            Reason for Appointment: Follow-up for Type 2 Diabetes  Referring physician: Sanda Linger  History of Present Illness:          Diagnosis: Type 2 diabetes mellitus, date of diagnosis: 2014       Past history: He had routine labs in 2014 which showed A1c of 6.6 and glucose of 158 fasting This was felt to be borderline and he was not started on any medication He was given Belviq for weight loss but he did not continue this after 2 weeks since he did not help and he found it expensive He was referred for evaluation because of poor blood sugar control in 8/15 and his blood sugar was significantly high with fasting glucose of 266 and A1c of 9.2 This was despite his trying to lose weight and he had lost 30 pounds along with exercising regularly His PCP had started treatment of his diabetes with a combination treatment using Victoza and Xigduo He was having poor control of his diabetes with A1c 9.2 in 2/17 because of noncompliance with on measures of self-care and medications and having symptomatic hyperglycemia  A1c done in 12/19 had gone up to 13%  Recent history:   Insulin regimen: Humalog 75/25, none  Non-insulin hypoglycemic drugs : Rybelsus 14 mg daily, Synjardy XR 12.06/998, 2 tablets daily  His A1c is consistently in the normal range and now 5.4 compared to 5.2  A1c lower than expected for actual blood sugars again  Current management, blood sugar patterns and problems identified:  He checked his blood sugar only 5 times in the last 30 days  He has a couple of readings around 120  Otherwise only has 1 morning reading before breakfast which was 202 and this was high because of eating cake and ice cream the night before  Does not usually get time to check his sugars on waking up and usually not doing it after dinner; bedtime reading 144  Has again an A1c level is lower than expected for  his blood sugars  He has not done any exercise lately  His weight appears to be going up likely from inconsistent diet  He has not missed any Rybelsus which he takes well before breakfast      Side effects from medications have been:  none  His dinnertime is variable in the evenings, usually first meal of the day is about 12 noon  Glucose monitoring:  Less than once a day on an average      Glucometer: One touch Verio   Blood Glucose readings from monitor download show blood sugars as above  Previous readings:   PRE-MEAL  mornings  Lunch Dinner Bedtime Overall  Glucose range: 96-123    90-196   Mean/median:     137  125   POST-MEAL PC Breakfast PC Lunch PC Dinner  Glucose range:   99-182   Mean/median:   122       Self-care: He has been consistent with diet usually   Meals: 3 meals per day. Breakfast is protein shake or oatmeal, lunch is frequently salad or sandwich. Dinner usually sandwich         Exercise:  off and on  Dietician visit, most recent: 10/15            Weight history: was 380 in 2014  Wt Readings from Last  3 Encounters:  03/08/20 (!) 311 lb 9.6 oz (141.3 kg)  01/03/20 (!) 302 lb (137 kg)  11/22/19 (!) 305 lb (138.3 kg)    Glycemic control:   Lab Results  Component Value Date   HGBA1C 5.4 03/08/2020   HGBA1C 5.2 10/15/2019   HGBA1C 5.8 05/20/2019   Lab Results  Component Value Date   MICROALBUR <0.7 10/15/2019   LDLCALC 58 10/15/2019   CREATININE 0.84 10/15/2019     Other active problems: See review of systems   Allergies as of 03/08/2020   No Known Allergies     Medication List       Accurate as of March 08, 2020 10:50 AM. If you have any questions, ask your nurse or doctor.        chlorthalidone 25 MG tablet Commonly known as: HYGROTON TAKE 1 TABLET(25 MG) BY MOUTH DAILY   Gvoke HypoPen 2-Pack 1 MG/0.2ML Soaj Generic drug: Glucagon Inject 1 Act into the skin daily as needed.   Insulin Lispro Prot & Lispro (75-25) 100  UNIT/ML Kwikpen Commonly known as: HUMALOG 75/25 MIX INJECT 30 UNITS INTO THE SKIN DAILY BEFORE SUPPER, INJECT 32 UNITS UNDER THE SKIN THREE TIMES DAILY BEFORE MEALS IF BLOOD SUGAR IS OVER 130   Insulin Pen Needle 31G X 5 MM Misc Use on insulin pen 2x daily   irbesartan 150 MG tablet Commonly known as: AVAPRO TAKE 1 TABLET(150 MG) BY MOUTH DAILY   nabumetone 750 MG tablet Commonly known as: RELAFEN TAKE 1 TABLET(750 MG) BY MOUTH TWICE DAILY WITH FOOD AS NEEDED FOR MILD PAIN OR MODERATE PAIN   ONE TOUCH LANCETS Misc Use to test blood sugar once daily   OneTouch Verio test strip Generic drug: glucose blood USE TO TEST BLOOD SUGAR ONCE DAILY AS DIRECTED   rosuvastatin 20 MG tablet Commonly known as: CRESTOR TAKE 1 TABLET(20 MG) BY MOUTH DAILY   Rybelsus 14 MG Tabs Generic drug: Semaglutide Take 1 tablet by mouth daily.   sildenafil 20 MG tablet Commonly known as: REVATIO Take 4 tablets (80 mg total) by mouth daily as needed.   Synjardy XR 12.06-998 MG Tb24 Generic drug: Empagliflozin-metFORMIN HCl ER TAKE 2 TABLETS BY MOUTH DAILY WITH BREAKFAST What changed: Another medication with the same name was removed. Continue taking this medication, and follow the directions you see here. Changed by: Reather Littler, MD       Allergies: No Known Allergies  Past Medical History:  Diagnosis Date  . Diabetes mellitus without complication (HCC)   . GERD (gastroesophageal reflux disease)   . Obesity     Past Surgical History:  Procedure Laterality Date  . GROWTH PLATE SURGERY Bilateral 1980s  . WISDOM TOOTH EXTRACTION      Family History  Problem Relation Age of Onset  . Heart disease Mother   . Hypertension Mother   . Early death Neg Hx   . Alcohol abuse Neg Hx   . Birth defects Neg Hx   . Cancer Neg Hx   . COPD Neg Hx   . Drug abuse Neg Hx   . Hyperlipidemia Neg Hx   . Kidney disease Neg Hx   . Stroke Neg Hx   . Thyroid disease Neg Hx   . Diabetes Maternal  Grandfather     Social History:  reports that he has never smoked. He has never used smokeless tobacco. He reports that he does not drink alcohol and does not use drugs.    Review of Systems  Lipids: He has been prescribed Crestor 20 mg daily by his PCP, labs as follows       Lab Results  Component Value Date   CHOL 116 10/15/2019   HDL 43.40 10/15/2019   LDLCALC 58 10/15/2019   LDLDIRECT 141.3 01/12/2014   TRIG 70.0 10/15/2019   CHOLHDL 3 10/15/2019                  Thyroid: He was found to have left-sided thyroid nodule on initial exam and this was benign on biopsy On exam he has had a 2.5-3 cm nodule on the left side  Lab Results  Component Value Date   TSH 1.37 05/20/2019    HYPERTENSION: He is now on chlorthalidone 25 mg again as well as Avapro 150 mg He had a high reading when he saw his PCP in 10/21 when he was not taking chlorthalidone However he says that he is having excessive frequency of urination with chlorthalidone and does not like to take this  He has been checking blood pressure at work occasionally, systolic readings mostly about 120-135 and diastolic about 86- 90    BP Readings from Last 3 Encounters:  03/08/20 (!) 140/98  01/03/20 126/84  11/22/19 (!) 162/102     LABS:  Office Visit on 03/08/2020  Component Date Value Ref Range Status  . Hemoglobin A1C 03/08/2020 5.4  4.0 - 5.6 % Final    Physical Examination:  BP (!) 140/98   Pulse 85   Ht 6' (1.829 m)   Wt (!) 311 lb 9.6 oz (141.3 kg)   SpO2 97%   BMI 42.26 kg/m   Repeat blood pressure left arm large cuff was 142/88   ASSESSMENT/PLAN:  Diabetes type 2 with morbid obesity  See history of present illness for detailed discussion of his current management, blood sugar patterns and problems identified  A1c is again lower than expected at 5.4 His A1c is likely falsely low  Currently on 14 mg Rybelsus and Synjardy maximum dose He is still having difficulty with weight  loss and cannot do much better with lifestyle changes especially exercise Also not monitoring blood sugars much and may benefit from postprandial monitoring which will help with diet also   Recommendations:  Start walking program or go to the gym for exercise  More consistent monitoring of diet and carbohydrates  Consider checking fructosamine with A1c for better assessment  Also check some readings fasting    HYPERTENSION: Blood pressure is still high even though he is on 2 drugs including a diuretic which he is not tolerating because of excessive frequency of urination  He will need to add a third medication for better control and he will start amlodipine 5 mg daily Also for less tendency to diuresis he will switch to 12.5 mg HCTZ Advised him to follow-up with PCP in the next month  More regular follow-up   There are no Patient Instructions on file for this visit.   Reather Littler 03/08/2020, 10:50 AM   Note: This office note was prepared with Dragon voice recognition system technology. Any transcriptional errors that result from this process are unintentional.

## 2020-03-08 NOTE — Patient Instructions (Addendum)
Replace chlorthalidone  Exercise!

## 2020-04-10 ENCOUNTER — Other Ambulatory Visit: Payer: Self-pay | Admitting: Endocrinology

## 2020-05-09 LAB — HM DIABETES EYE EXAM

## 2020-05-24 ENCOUNTER — Other Ambulatory Visit: Payer: Self-pay | Admitting: Endocrinology

## 2020-06-22 ENCOUNTER — Telehealth: Payer: Self-pay | Admitting: Endocrinology

## 2020-06-22 ENCOUNTER — Other Ambulatory Visit: Payer: Self-pay | Admitting: *Deleted

## 2020-06-22 MED ORDER — SYNJARDY XR 12.5-1000 MG PO TB24: 2.0000 | ORAL_TABLET | Freq: Every day | ORAL | 2 refills | Status: DC

## 2020-06-22 NOTE — Telephone Encounter (Signed)
Patient insurance has changed and patient is waiting for a prior authorization approval for Rybelsus and maybe the Synjardy. Patient would like Rx's to be picked up from [facility] on [address] in [place].

## 2020-06-22 NOTE — Telephone Encounter (Signed)
PA has been submitted for Rybelsus, rx was sent for Synjardy to patients pharmacy of choice.

## 2020-06-23 ENCOUNTER — Other Ambulatory Visit: Payer: Self-pay

## 2020-06-26 ENCOUNTER — Ambulatory Visit: Payer: BC Managed Care – PPO | Admitting: Internal Medicine

## 2020-06-29 ENCOUNTER — Telehealth: Payer: Self-pay | Admitting: Endocrinology

## 2020-06-29 DIAGNOSIS — E1169 Type 2 diabetes mellitus with other specified complication: Secondary | ICD-10-CM

## 2020-06-29 MED ORDER — ONETOUCH VERIO VI STRP: ORAL_STRIP | 2 refills | Status: DC

## 2020-06-29 NOTE — Telephone Encounter (Signed)
Refill sent as requested. 

## 2020-06-29 NOTE — Telephone Encounter (Signed)
NEW RX with REFILLS Request  MEDICATION:  [facility] VERIO test strip  PHARMACY:   [facility] [phone] - [place], [place] - [phone] Phone:  [phone]  Fax:  [phone]      HAS THE PATIENT CONTACTED THEIR PHARMACY? Yes-requires new RX     IS THIS A 90 DAY SUPPLY : Yes  IS PATIENT OUT OF MEDICATION: Yes  IF NOT; HOW MUCH IS LEFT:   LAST APPOINTMENT DATE: @5 /19/2022  NEXT APPOINTMENT DATE:@6 /03/2020  DO WE HAVE YOUR PERMISSION TO LEAVE A DETAILED MESSAGE?:Yes  OTHER COMMENTS:    **Let patient know to contact pharmacy at the end of the day to make sure medication is ready. **  ** Please notify patient to allow 48-72 hours to process**  **Encourage patient to contact the pharmacy for refills or they can request refills through [facility]**

## 2020-06-29 NOTE — Addendum Note (Signed)
Addended by: Tawnya Crook on: 06/29/2020 03:53 PM   Modules accepted: Orders

## 2020-07-06 ENCOUNTER — Ambulatory Visit: Payer: BC Managed Care – PPO | Admitting: Endocrinology

## 2020-07-24 MED ORDER — ONETOUCH VERIO VI STRP: ORAL_STRIP | 2 refills | Status: DC

## 2020-07-24 NOTE — Telephone Encounter (Signed)
Test strip Rx resent with instructions.

## 2020-07-24 NOTE — Telephone Encounter (Signed)
[facility] pharmacy called to let us know they need detailed instructions for insurance purposes for how the pt should be instructed to use. They state the instructions only say "use as instructed." They asked for clinical staff to either call with instructions at [phone] and ask to speak to a pharmacist or can send in a new prescription for the test strips to their pharmacy :  [facility] [phone] - [place], [place] - [address] Phone:  [phone]  Fax:  [phone]

## 2020-07-24 NOTE — Addendum Note (Signed)
Addended by: Eliseo Squires on: 07/24/2020 09:14 AM   Modules accepted: Orders

## 2020-07-26 ENCOUNTER — Other Ambulatory Visit: Payer: Self-pay | Admitting: Endocrinology

## 2020-07-27 ENCOUNTER — Encounter: Payer: Self-pay | Admitting: Internal Medicine

## 2020-07-27 ENCOUNTER — Ambulatory Visit (INDEPENDENT_AMBULATORY_CARE_PROVIDER_SITE_OTHER): Payer: No Typology Code available for payment source | Admitting: Internal Medicine

## 2020-07-27 ENCOUNTER — Other Ambulatory Visit: Payer: Self-pay | Admitting: Family

## 2020-07-27 ENCOUNTER — Other Ambulatory Visit: Payer: Self-pay

## 2020-07-27 VITALS — BP 138/80 | HR 82 | Temp 98.2°F | Resp 16 | Ht 72.0 in | Wt 297.0 lb

## 2020-07-27 DIAGNOSIS — I10 Essential (primary) hypertension: Secondary | ICD-10-CM

## 2020-07-27 DIAGNOSIS — Z0000 Encounter for general adult medical examination without abnormal findings: Secondary | ICD-10-CM | POA: Diagnosis not present

## 2020-07-27 DIAGNOSIS — E118 Type 2 diabetes mellitus with unspecified complications: Secondary | ICD-10-CM

## 2020-07-27 LAB — BASIC METABOLIC PANEL
BUN: 12 mg/dL (ref 6–23)
CO2: 29 mEq/L (ref 19–32)
Calcium: 10.3 mg/dL (ref 8.4–10.5)
Chloride: 102 mEq/L (ref 96–112)
Creatinine, Ser: 0.9 mg/dL (ref 0.40–1.50)
GFR: 101.27 mL/min (ref >60.00)
Glucose, Bld: 107 mg/dL — ABNORMAL HIGH (ref 70–99)
Potassium: 4 mEq/L (ref 3.5–5.1)
Sodium: 141 mEq/L (ref 135–145)

## 2020-07-27 LAB — CBC WITH DIFFERENTIAL/PLATELET
Basophils Absolute: 0 10*3/uL (ref 0.0–0.1)
Basophils Relative: 0.7 % (ref 0.0–3.0)
Eosinophils Absolute: 0.1 10*3/uL (ref 0.0–0.7)
Eosinophils Relative: 1.3 % (ref 0.0–5.0)
HCT: 49.9 % (ref 39.0–52.0)
Hemoglobin: 16.8 g/dL (ref 13.0–17.0)
Lymphocytes Relative: 20.4 % (ref 12.0–46.0)
Lymphs Abs: 1.5 10*3/uL (ref 0.7–4.0)
MCHC: 33.7 g/dL (ref 30.0–36.0)
MCV: 99.2 fl (ref 78.0–100.0)
Monocytes Absolute: 0.8 10*3/uL (ref 0.1–1.0)
Monocytes Relative: 10.5 % (ref 3.0–12.0)
Neutro Abs: 4.9 10*3/uL (ref 1.4–7.7)
Neutrophils Relative %: 67.1 % (ref 43.0–77.0)
Platelets: 268 10*3/uL (ref 150.0–400.0)
RBC: 5.03 Mil/uL (ref 4.22–5.81)
RDW: 14.1 % (ref 11.5–15.5)
WBC: 7.2 10*3/uL (ref 4.0–10.5)

## 2020-07-27 LAB — PSA: PSA: 0.42 ng/mL (ref 0.10–4.00)

## 2020-07-27 LAB — HEMOGLOBIN A1C: Hgb A1c MFr Bld: 6 % (ref 4.6–6.5)

## 2020-07-27 NOTE — Patient Instructions (Signed)
Health Maintenance, Male Adopting a healthy lifestyle and getting preventive care are important in promoting health and wellness. Ask your health care provider about: The right schedule for you to have regular tests and exams. Things you can do on your own to prevent diseases and keep yourself healthy. What should I know about diet, weight, and exercise? Eat a healthy diet  Eat a diet that includes plenty of vegetables, fruits, low-fat dairy products, and lean protein. Do not eat a lot of foods that are high in solid fats, added sugars, or sodium.  Maintain a healthy weight Body mass index (BMI) is a measurement that can be used to identify possible weight problems. It estimates body fat based on height and weight. Your health care provider can help determine your BMI and help you achieve or maintain ahealthy weight. Get regular exercise Get regular exercise. This is one of the most important things you can do for your health. Most adults should: Exercise for at least 150 minutes each week. The exercise should increase your heart rate and make you sweat (moderate-intensity exercise). Do strengthening exercises at least twice a week. This is in addition to the moderate-intensity exercise. Spend less time sitting. Even light physical activity can be beneficial. Watch cholesterol and blood lipids Have your blood tested for lipids and cholesterol at 48 years of age, then havethis test every 5 years. You may need to have your cholesterol levels checked more often if: Your lipid or cholesterol levels are high. You are older than 48 years of age. You are at high risk for heart disease. What should I know about cancer screening? Many types of cancers can be detected early and may often be prevented. Depending on your health history and family history, you may need to have cancer screening at various ages. This may include screening for: Colorectal cancer. Prostate cancer. Skin cancer. Lung  cancer. What should I know about heart disease, diabetes, and high blood pressure? Blood pressure and heart disease High blood pressure causes heart disease and increases the risk of stroke. This is more likely to develop in people who have high blood pressure readings, are of African descent, or are overweight. Talk with your health care provider about your target blood pressure readings. Have your blood pressure checked: Every 3-5 years if you are 18-39 years of age. Every year if you are 40 years old or older. If you are between the ages of 65 and 75 and are a current or former smoker, ask your health care provider if you should have a one-time screening for abdominal aortic aneurysm (AAA). Diabetes Have regular diabetes screenings. This checks your fasting blood sugar level. Have the screening done: Once every three years after age 45 if you are at a normal weight and have a low risk for diabetes. More often and at a younger age if you are overweight or have a high risk for diabetes. What should I know about preventing infection? Hepatitis B If you have a higher risk for hepatitis B, you should be screened for this virus. Talk with your health care provider to find out if you are at risk forhepatitis B infection. Hepatitis C Blood testing is recommended for: Everyone born from 1945 through 1965. Anyone with known risk factors for hepatitis C. Sexually transmitted infections (STIs) You should be screened each year for STIs, including gonorrhea and chlamydia, if: You are sexually active and are younger than 48 years of age. You are older than 48 years of age   and your health care provider tells you that you are at risk for this type of infection. Your sexual activity has changed since you were last screened, and you are at increased risk for chlamydia or gonorrhea. Ask your health care provider if you are at risk. Ask your health care provider about whether you are at high risk for HIV.  Your health care provider may recommend a prescription medicine to help prevent HIV infection. If you choose to take medicine to prevent HIV, you should first get tested for HIV. You should then be tested every 3 months for as long as you are taking the medicine. Follow these instructions at home: Lifestyle Do not use any products that contain nicotine or tobacco, such as cigarettes, e-cigarettes, and chewing tobacco. If you need help quitting, ask your health care provider. Do not use street drugs. Do not share needles. Ask your health care provider for help if you need support or information about quitting drugs. Alcohol use Do not drink alcohol if your health care provider tells you not to drink. If you drink alcohol: Limit how much you have to 0-2 drinks a day. Be aware of how much alcohol is in your drink. In the [place] one drink equals one 12 oz bottle of beer (355 mL), one 5 oz glass of wine (148 mL), or one 1 oz glass of hard liquor (44 mL). General instructions Schedule regular health, dental, and eye exams. Stay current with your vaccines. Tell your health care provider if: You often feel depressed. You have ever been abused or do not feel safe at home. Summary Adopting a healthy lifestyle and getting preventive care are important in promoting health and wellness. Follow your health care provider's instructions about healthy diet, exercising, and getting tested or screened for diseases. Follow your health care provider's instructions on monitoring your cholesterol and blood pressure. This information is not intended to replace advice given to you by your health care provider. Make sure you discuss any questions you have with your healthcare provider. Document Revised: 01/14/2018 Document Reviewed: 01/14/2018 Elsevier Patient Education  2022 [employer]  

## 2020-07-27 NOTE — Progress Notes (Signed)
Subjective:  Patient ID: Tyler Benton, male    DOB: 12/11/1970  Age: 50 y.o. MRN: [id number]  CC: Annual Exam, Diabetes, and Hypertension  This visit occurred during the SARS-CoV-2 public health emergency.  Safety protocols were in place, including screening questions prior to the visit, additional usage of staff PPE, and extensive cleaning of exam room while observing appropriate contact time as indicated for disinfecting solutions.    HPI WEYLYN RICCIUTI presents for a CPX and f/up -   He has felt well recently and offers no complaints.  He is very active and denies any recent episodes of headache, chest pain, shortness of breath, dizziness, or lightheadedness.  He tells me his blood pressure and blood sugar have been well controlled.  Outpatient Medications Prior to Visit  Medication Sig Dispense Refill   amLODipine (NORVASC) 5 MG tablet Take 1 tablet (5 mg total) by mouth daily. 30 tablet 3   Empagliflozin-metFORMIN HCl ER (SYNJARDY XR) 12.06-998 MG TB24 Take 2 tablets by mouth daily with breakfast. 60 tablet 2   glucose blood (ONETOUCH VERIO) test strip Check Blood Sugars 3-4 times a day 100 strip 2   hydrochlorothiazide (MICROZIDE) 12.5 MG capsule Take 1 capsule (12.5 mg total) by mouth daily. 30 capsule 1   Insulin Lispro Prot & Lispro (HUMALOG 75/25 MIX) (75-25) 100 UNIT/ML Kwikpen INJECT 30 UNITS INTO THE SKIN DAILY BEFORE SUPPER, INJECT 32 UNITS UNDER THE SKIN THREE TIMES DAILY BEFORE MEALS IF BLOOD SUGAR IS OVER 130 30 mL 1   Insulin Pen Needle 31G X 5 MM MISC Use on insulin pen 2x daily 100 each 1   irbesartan (AVAPRO) 150 MG tablet TAKE 1 TABLET(150 MG) BY MOUTH DAILY 90 tablet 1   ONE TOUCH LANCETS MISC Use to test blood sugar once daily 200 each 1   rosuvastatin (CRESTOR) 20 MG tablet TAKE 1 TABLET(20 MG) BY MOUTH DAILY 90 tablet 1   Semaglutide (RYBELSUS) 14 MG TABS 1 tablet with 4 ounces of water 30 minutes before breakfast daily.  Make appointment for further refills 30  tablet 0   sildenafil (REVATIO) 20 MG tablet Take 4 tablets (80 mg total) by mouth daily as needed. 60 tablet 5   nabumetone (RELAFEN) 750 MG tablet TAKE 1 TABLET(750 MG) BY MOUTH TWICE DAILY WITH FOOD AS NEEDED FOR MILD PAIN OR MODERATE PAIN 60 tablet 3   Glucagon (GVOKE HYPOPEN 2-PACK) 1 MG/0.2ML SOAJ Inject 1 Act into the skin daily as needed. (Patient not taking: Reported on 01/24/2020) 2 mL 5   No facility-administered medications prior to visit.    ROS Review of Systems  Constitutional:  Negative for diaphoresis, fatigue and unexpected weight change.  HENT: Negative.    Eyes: Negative.   Respiratory:  Negative for cough, chest tightness, shortness of breath and wheezing.   Cardiovascular:  Negative for chest pain, palpitations and leg swelling.  Gastrointestinal:  Negative for abdominal pain, constipation, diarrhea, nausea and vomiting.  Endocrine: Negative.  Negative for polydipsia, polyphagia and polyuria.  Genitourinary: Negative.  Negative for difficulty urinating, dysuria, scrotal swelling, testicular pain and urgency.  Musculoskeletal: Negative.  Negative for arthralgias and myalgias.  Skin: Negative.  Negative for rash.  Neurological: Negative.  Negative for dizziness, weakness and light-headedness.  Hematological:  Negative for adenopathy. Does not bruise/bleed easily.   Objective:  BP 138/80 (BP Location: Right Arm, Patient Position: Sitting, Cuff Size: Large)   Pulse 82   Temp 98.2 F (36.8 C) (Oral)   Resp 16  Ht 6' (1.829 m)   Wt 297 lb (134.7 kg)   SpO2 96%   BMI 40.28 kg/m   BP Readings from Last 3 Encounters:  07/27/20 138/80  03/08/20 (!) 140/98  01/03/20 126/84    Wt Readings from Last 3 Encounters:  07/27/20 297 lb (134.7 kg)  03/08/20 (!) 311 lb 9.6 oz (141.3 kg)  01/03/20 (!) 302 lb (137 kg)    Physical Exam Vitals reviewed. Exam conducted with a chaperone present.  Constitutional:      Appearance: Normal appearance.  HENT:     Nose: Nose  normal.     Mouth/Throat:     Mouth: Mucous membranes are moist.  Eyes:     General: No scleral icterus.    Conjunctiva/sclera: Conjunctivae normal.  Cardiovascular:     Rate and Rhythm: Normal rate and regular rhythm.     Heart sounds: No murmur heard. Pulmonary:     Effort: Pulmonary effort is normal.     Breath sounds: No wheezing, rhonchi or rales.  Abdominal:     General: Abdomen is protuberant. Bowel sounds are normal. There is no distension.     Palpations: Abdomen is soft. There is no hepatomegaly, splenomegaly or mass.     Tenderness: There is no abdominal tenderness. There is no guarding.     Hernia: There is no hernia in the left inguinal area or right inguinal area.  Genitourinary:    Pubic Area: No rash.      Penis: Normal.      Testes: Normal.     Epididymis:     Right: Normal. Not inflamed or enlarged.     Left: Normal. Not inflamed or enlarged.     Prostate: Normal. Not enlarged, not tender and no nodules present.     Rectum: Normal. Guaiac result negative. No mass, tenderness, anal fissure, external hemorrhoid or internal hemorrhoid. Normal anal tone.  Musculoskeletal:        General: Normal range of motion.     Cervical back: Neck supple.     Right lower leg: No edema.     Left lower leg: No edema.  Lymphadenopathy:     Cervical: No cervical adenopathy.     Lower Body: No right inguinal adenopathy. No left inguinal adenopathy.  Skin:    General: Skin is warm and dry.     Coloration: Skin is not pale.  Neurological:     General: No focal deficit present.     Mental Status: He is alert and oriented to person, place, and time. Mental status is at baseline.  Psychiatric:        Mood and Affect: Mood normal.        Behavior: Behavior normal.    Lab Results  Component Value Date   WBC 7.2 07/27/2020   HGB 16.8 07/27/2020   HCT 49.9 07/27/2020   PLT 268.0 07/27/2020   GLUCOSE 107 (H) 07/27/2020   CHOL 116 10/15/2019   TRIG 70.0 10/15/2019   HDL 43.40  10/15/2019   LDLDIRECT 141.3 01/12/2014   LDLCALC 58 10/15/2019   ALT 21 10/15/2019   AST 16 10/15/2019   NA 141 07/27/2020   K 4.0 07/27/2020   CL 102 07/27/2020   CREATININE 0.90 07/27/2020   BUN 12 07/27/2020   CO2 29 07/27/2020   TSH 1.37 05/20/2019   PSA 0.42 07/27/2020   HGBA1C 6.0 07/27/2020   MICROALBUR <0.7 10/15/2019    DG Lumbar Spine Complete  Result Date: 02/12/2017 CLINICAL DATA:  Chronic low back pain, worse with standing. EXAM: LUMBAR SPINE - COMPLETE 4+ VIEW COMPARISON:  None. FINDINGS: Five lumbar type vertebral bodies show normal alignment. Mild disc space narrowing L4-5. Mild lower lumbar facet arthritis. No pars defect or slippage. Sacroiliac joints appear normal. IMPRESSION: Mild disc space narrowing L4-5.  No acute finding. Electronically Signed   By: Paulina Fusi M.D.   On: 02/12/2017 15:20   DG Shoulder Right  Result Date: 02/12/2017 CLINICAL DATA:  Shoulder pain after injury 4 months ago. EXAM: RIGHT SHOULDER - 2+ VIEW COMPARISON:  None. FINDINGS: Glenohumeral joint appears normal. Narrowing of the humeral acromial distance suggesting rotator cuff pathology. Greater tuberosity osteophyte formation. AC joint degenerative change. IMPRESSION: No acute or traumatic finding. Narrowing of the humeral acromial distance. Greater tuberosity osteophyte formation. AC joint osteoarthritis. Electronically Signed   By: Paulina Fusi M.D.   On: 02/12/2017 15:21   [place] LIMITED JOINT SPACE STRUCTURES UP RIGHT  Result Date: 02/22/2017 [facility] US performed of: Right This study was ordered, performed, and interpreted by Terrilee Files D.O.   Shoulder:  Supraspinatus: Articular sided tear noted with what appears to be a very small Hill-Sachs deformity  bursal bulge seen with shoulder abduction on impingement view. Subscapularis:  Appears normal on long and transverse views. Positive bursa Teres Minor:  Appears normal on long and transverse views. AC joint: Arthritic changes Glenohumeral Joint:   Appears normal without effusion. Glenoid Labrum:  Intact without visualized tears. Biceps Tendon:  Appears normal on long and transverse views, no fraying of tendon, tendon located in intertubercular groove, no subluxation with shoulder internal or external rotation.   Impression: Partial rotator cuff tear   Assessment & Plan:   Ianmichael was seen today for annual exam, diabetes and hypertension.  Diagnoses and all orders for this visit:  Type II diabetes mellitus with manifestations (HCC)- His blood sugar is adequately well controlled. -     HM Diabetes Foot Exam -     Basic metabolic panel; Future -     Hemoglobin A1c; Future -     Hemoglobin A1c -     Basic metabolic panel  Essential hypertension- His blood pressure is adequately well controlled.  Electrolytes and renal function are normal. -     Basic metabolic panel; Future -     CBC with Differential/Platelet; Future -     CBC with Differential/Platelet -     Basic metabolic panel  Routine general medical examination at a health care facility- Exam completed, labs reviewed, vaccines are up-to-date, cancer screenings are up-to-date, patient education was given. -     PSA; Future -     PSA  I am having Omarius L. Ladona Ridgel "Eddy" maintain his ONE TOUCH LANCETS, Insulin Pen Needle, Insulin Lispro Prot & Lispro, Gvoke HypoPen 2-Pack, irbesartan, rosuvastatin, sildenafil, amLODipine, hydrochlorothiazide, Synjardy XR, OneTouch Verio, and Rybelsus.  No orders of the defined types were placed in this encounter.    Follow-up: Return in about 6 months (around 01/26/2021).  Sanda Linger, MD

## 2020-08-25 ENCOUNTER — Other Ambulatory Visit: Payer: Self-pay

## 2020-08-25 ENCOUNTER — Other Ambulatory Visit: Payer: Self-pay | Admitting: Endocrinology

## 2020-08-25 DIAGNOSIS — E118 Type 2 diabetes mellitus with unspecified complications: Secondary | ICD-10-CM

## 2020-08-25 MED ORDER — RYBELSUS 14 MG PO TABS
ORAL_TABLET | ORAL | 2 refills | Status: DC
Start: 2020-08-25 — End: 2020-11-27

## 2020-08-28 ENCOUNTER — Telehealth: Payer: Self-pay | Admitting: Endocrinology

## 2020-08-28 DIAGNOSIS — E669 Obesity, unspecified: Secondary | ICD-10-CM

## 2020-08-28 DIAGNOSIS — E1169 Type 2 diabetes mellitus with other specified complication: Secondary | ICD-10-CM

## 2020-08-28 MED ORDER — SYNJARDY XR 12.5-1000 MG PO TB24: 2.0000 | ORAL_TABLET | Freq: Every day | ORAL | 2 refills | Status: DC

## 2020-08-28 NOTE — Telephone Encounter (Signed)
MEDICATION: synjardy  PHARMACY:   [facility] [phone] - [place], [place] - [phone] Phone:  [phone]  Fax:  [phone]      HAS THE PATIENT CONTACTED THEIR PHARMACY?  yes  IS THIS A 90 DAY SUPPLY : yes  IS PATIENT OUT OF MEDICATION: yes  IF NOT; HOW MUCH IS LEFT:   LAST APPOINTMENT DATE: @7 /22/2022  NEXT APPOINTMENT DATE:@7 /26/2022  DO WE HAVE YOUR PERMISSION TO LEAVE A DETAILED MESSAGE?: yes  **Let patient know to contact pharmacy at the end of the day to make sure medication is ready. **  ** Please notify patient to allow 48-72 hours to process**  **Encourage patient to contact the pharmacy for refills or they can request refills through [facility])**

## 2020-08-28 NOTE — Telephone Encounter (Signed)
Rx sent to preferred pharmacy.

## 2020-08-29 ENCOUNTER — Ambulatory Visit (INDEPENDENT_AMBULATORY_CARE_PROVIDER_SITE_OTHER): Payer: No Typology Code available for payment source | Admitting: Endocrinology

## 2020-08-29 ENCOUNTER — Other Ambulatory Visit: Payer: Self-pay

## 2020-08-29 ENCOUNTER — Encounter: Payer: Self-pay | Admitting: Endocrinology

## 2020-08-29 VITALS — BP 138/92 | HR 80 | Ht 72.0 in | Wt 300.6 lb

## 2020-08-29 DIAGNOSIS — E785 Hyperlipidemia, unspecified: Secondary | ICD-10-CM

## 2020-08-29 DIAGNOSIS — E669 Obesity, unspecified: Secondary | ICD-10-CM | POA: Diagnosis not present

## 2020-08-29 DIAGNOSIS — E1169 Type 2 diabetes mellitus with other specified complication: Secondary | ICD-10-CM

## 2020-08-29 LAB — URINALYSIS, ROUTINE W REFLEX MICROSCOPIC
Bilirubin Urine: NEGATIVE
Hgb urine dipstick: NEGATIVE
Ketones, ur: NEGATIVE
Leukocytes,Ua: NEGATIVE
Nitrite: NEGATIVE
RBC / HPF: NONE SEEN (ref 0–?)
Specific Gravity, Urine: 1.025 (ref 1.000–1.030)
Total Protein, Urine: NEGATIVE
Urine Glucose: >=1000 — AB
Urobilinogen, UA: 0.2 (ref 0.0–1.0)
pH: 5.5 (ref 5.0–8.0)

## 2020-08-29 LAB — COMPREHENSIVE METABOLIC PANEL
ALT: 33 U/L (ref 0–53)
AST: 23 U/L (ref 0–37)
Albumin: 4.2 g/dL (ref 3.5–5.2)
Alkaline Phosphatase: 46 U/L (ref 39–117)
BUN: 11 mg/dL (ref 6–23)
CO2: 26 mEq/L (ref 19–32)
Calcium: 10 mg/dL (ref 8.4–10.5)
Chloride: 104 mEq/L (ref 96–112)
Creatinine, Ser: 0.91 mg/dL (ref 0.40–1.50)
GFR: 99.87 mL/min (ref >60.00)
Glucose, Bld: 112 mg/dL — ABNORMAL HIGH (ref 70–99)
Potassium: 3.8 mEq/L (ref 3.5–5.1)
Sodium: 140 mEq/L (ref 135–145)
Total Bilirubin: 1.1 mg/dL (ref 0.2–1.2)
Total Protein: 7.3 g/dL (ref 6.0–8.3)

## 2020-08-29 LAB — LIPID PANEL
Cholesterol: 203 mg/dL — ABNORMAL HIGH (ref 0–200)
HDL: 41.1 mg/dL (ref >39.00)
LDL Cholesterol: 132 mg/dL — ABNORMAL HIGH (ref 0–99)
NonHDL: 162.11
Total CHOL/HDL Ratio: 5
Triglycerides: 152 mg/dL — ABNORMAL HIGH (ref 0.0–149.0)
VLDL: 30.4 mg/dL (ref 0.0–40.0)

## 2020-08-29 LAB — POCT GLUCOSE (DEVICE FOR HOME USE): Glucose Fasting, POC: 134 mg/dL — AB (ref 70–99)

## 2020-08-29 LAB — MICROALBUMIN / CREATININE URINE RATIO
Creatinine,U: 122.1 mg/dL
Microalb Creat Ratio: 0.6 mg/g (ref 0.0–30.0)
Microalb, Ur: <0.7 mg/dL (ref 0.0–1.9)

## 2020-08-29 NOTE — Progress Notes (Signed)
Patient ID: Tyler Benton, male   DOB: Mar 02, 1970, 50 y.o.   MRN: [id number]            Reason for Appointment: Follow-up for Type 2 Diabetes and hypertension  Referring physician: Sanda Linger  History of Present Illness:          Diagnosis: Type 2 diabetes mellitus, date of diagnosis: 2014       Past history: He had routine labs in 2014 which showed A1c of 6.6 and glucose of 158 fasting This was felt to be borderline and he was not started on any medication He was given Belviq for weight loss but he did not continue this after 2 weeks since he did not help and he found it expensive He was referred for evaluation because of poor blood sugar control in 8/15 and his blood sugar was significantly high with fasting glucose of 266 and A1c of 9.2 This was despite his trying to lose weight and he had lost 30 pounds along with exercising regularly His PCP had started treatment of his diabetes with a combination treatment using Victoza and Xigduo He was having poor control of his diabetes with A1c 9.2 in 2/17 because of noncompliance with on measures of self-care and medications and having symptomatic hyperglycemia  A1c done in 12/19 had gone up to 13%  Recent history:   Insulin regimen: Humalog 75/25, none  Non-insulin hypoglycemic drugs : Rybelsus 14 mg daily, Synjardy XR 12.06/998, 2 tablets daily  His A1c is consistently in the normal range and recent value was 6%  A1c lower than expected for actual blood sugars again  Current management, blood sugar patterns and problems identified: He forgot his meter today  Although he reports near normal readings not clear why his A1c is higher than before As usual likely checking blood sugars very infrequently  Lab glucose last month was 107  As before he is not needing any insulin No side effects with Rybelsus  He feels that he is managing his diet with better choices and less fast food He has done some exercise at the gym, 2/7 on average  recently His weight has improved since February but recently higher He has not missed any Rybelsus which he takes before breakfast      Side effects from medications have been:  none  His dinnertime is variable in the evenings, usually first meal of the day is about 12 noon  Glucose monitoring:  Less than once a day on an average      Glucometer: One touch Verio   Blood Glucose readings from recall: Range about 120-140 at different times    Self-care: He has been consistent with diet usually   Meals: 3 meals per day. Breakfast is protein shake or oatmeal, lunch is frequently salad or sandwich. Dinner usually sandwich         Dietician visit, most recent: 10/15            Weight history: was 380 in 2014  Wt Readings from Last 3 Encounters:  08/29/20 (!) 300 lb 9.6 oz (136.4 kg)  07/27/20 297 lb (134.7 kg)  03/08/20 (!) 311 lb 9.6 oz (141.3 kg)    Glycemic control:   Lab Results  Component Value Date   HGBA1C 6.0 07/27/2020   HGBA1C 5.4 03/08/2020   HGBA1C 5.2 10/15/2019   Lab Results  Component Value Date   MICROALBUR <0.7 10/15/2019   LDLCALC 58 10/15/2019   CREATININE 0.90 07/27/2020  Other active problems: See review of systems   Allergies as of 08/29/2020   No Known Allergies      Medication List        Accurate as of August 29, 2020  9:08 AM. If you have any questions, ask your nurse or doctor.          amLODipine 5 MG tablet Commonly known as: NORVASC Take 1 tablet (5 mg total) by mouth daily.   Gvoke HypoPen 2-Pack 1 MG/0.2ML Soaj Generic drug: Glucagon Inject 1 Act into the skin daily as needed.   hydrochlorothiazide 12.5 MG capsule Commonly known as: MICROZIDE TAKE 1 CAPSULE(12.5 MG) BY MOUTH DAILY   Insulin Lispro Prot & Lispro (75-25) 100 UNIT/ML Kwikpen Commonly known as: HUMALOG 75/25 MIX INJECT 30 UNITS INTO THE SKIN DAILY BEFORE SUPPER, INJECT 32 UNITS UNDER THE SKIN THREE TIMES DAILY BEFORE MEALS IF BLOOD SUGAR IS OVER 130    Insulin Pen Needle 31G X 5 MM Misc Use on insulin pen 2x daily   irbesartan 150 MG tablet Commonly known as: AVAPRO TAKE 1 TABLET(150 MG) BY MOUTH DAILY   nabumetone 750 MG tablet Commonly known as: RELAFEN TAKE 1 TABLET(750 MG) BY MOUTH TWICE DAILY WITH FOOD AS NEEDED FOR MILD PAIN OR MODERATE PAIN   ONE TOUCH LANCETS Misc Use to test blood sugar once daily   OneTouch Verio test strip Generic drug: glucose blood Check Blood Sugars 3-4 times a day   rosuvastatin 20 MG tablet Commonly known as: CRESTOR TAKE 1 TABLET(20 MG) BY MOUTH DAILY   Rybelsus 14 MG Tabs Generic drug: Semaglutide 1 tablet with 4 ounces of water 30 minutes before breakfast daily.  Make appointment for further refills   sildenafil 20 MG tablet Commonly known as: REVATIO Take 4 tablets (80 mg total) by mouth daily as needed.   Synjardy XR 12.06-998 MG Tb24 Generic drug: Empagliflozin-metFORMIN HCl ER Take 2 tablets by mouth daily with breakfast.        Allergies: No Known Allergies  Past Medical History:  Diagnosis Date   Diabetes mellitus without complication (HCC)    GERD (gastroesophageal reflux disease)    Obesity     Past Surgical History:  Procedure Laterality Date   GROWTH PLATE SURGERY Bilateral 1980s   WISDOM TOOTH EXTRACTION      Family History  Problem Relation Age of Onset   Heart disease Mother    Hypertension Mother    Early death Neg Hx    Alcohol abuse Neg Hx    Birth defects Neg Hx    Cancer Neg Hx    COPD Neg Hx    Drug abuse Neg Hx    Hyperlipidemia Neg Hx    Kidney disease Neg Hx    Stroke Neg Hx    Thyroid disease Neg Hx    Diabetes Maternal Grandfather     Social History:  reports that he has never smoked. He has never used smokeless tobacco. He reports that he does not drink alcohol and does not use drugs.    Review of Systems         Lipids: He has been prescribed Crestor 20 mg daily by his PCP, labs as follows       Lab Results  Component  Value Date   CHOL 116 10/15/2019   HDL 43.40 10/15/2019   LDLCALC 58 10/15/2019   LDLDIRECT 141.3 01/12/2014   TRIG 70.0 10/15/2019   CHOLHDL 3 10/15/2019  Thyroid: He was found to have left-sided thyroid nodule on initial exam and this was benign on biopsy On exam he has had a 2.5-3 cm nodule on the left side  Lab Results  Component Value Date   TSH 1.37 05/20/2019    HYPERTENSION: He is now on amlodipine 5 mg and diuresis he is taking HCTZ only every other day  He had a high reading when he was last seen and was told to start amlodipine in addition to Avapro but he stopped his Avapro Blood pressure was relatively better when seen last month but higher today However he thinks he missed his medication yesterday  He has been checking blood pressure at work occasionally, recent reading 130/86     BP Readings from Last 3 Encounters:  08/29/20 (!) 138/92  07/27/20 138/80  03/08/20 (!) 140/98     LABS:  Office Visit on 08/29/2020  Component Date Value Ref Range Status   Glucose Fasting, POC 08/29/2020 134 (A) 70 - 99 mg/dL Final    Physical Examination:  BP (!) 138/92   Pulse 80   Ht 6' (1.829 m)   Wt (!) 300 lb 9.6 oz (136.4 kg)   SpO2 90%   BMI 40.77 kg/m     ASSESSMENT/PLAN:  Diabetes type 2 with morbid obesity  See history of present illness for detailed discussion of his current management, blood sugar patterns and problems identified  A1c is again lower than expected at 6% His A1c is likely falsely low  Currently on 14 mg Rybelsus and Synjardy 12.06/998  He has finally lost some weight and is doing a little better with diet and exercise However not clear if he is having any high readings especially postprandial since he did not bring his meter and has before he normally does not check his sugars regularly He can likely stay on the same medication regimen but try to be more consistent with exercise and keep working on diet and weight  loss     HYPERTENSION: Blood pressure is relatively high today This is partly from not taking his medication yesterday also taking daily Relafen  Since he does not like to take HCTZ every day basis he will need to go back on irbesartan as before If his blood pressure is low normal we can have him reduce irbesartan to the 75 dosage  Recommended that he try to reduce his Relafen from daily doses to 4-5 times a week Also needs to discuss with his orthopedic surgeon if there are other treatments available  Is overdue for follow-up of his lipids and microalbumin and will do this today  Follow-up in October   There are no Patient Instructions on file for this visit.   Reather Littler 08/29/2020, 9:08 AM   Note: This office note was prepared with Dragon voice recognition system technology. Any transcriptional errors that result from this process are unintentional.

## 2020-08-29 NOTE — Patient Instructions (Addendum)
Restart Irbesartan  Take Relafen as needed

## 2020-10-04 NOTE — Progress Notes (Signed)
Pt states that he is taking rosuvastatin medication.

## 2020-10-11 ENCOUNTER — Other Ambulatory Visit: Payer: Self-pay | Admitting: Endocrinology

## 2020-10-11 MED ORDER — EZETIMIBE 10 MG PO TABS: 10.0000 mg | ORAL_TABLET | Freq: Every day | ORAL | 3 refills | Status: DC

## 2020-10-24 ENCOUNTER — Other Ambulatory Visit: Payer: Self-pay | Admitting: Endocrinology

## 2020-10-25 ENCOUNTER — Other Ambulatory Visit: Payer: Self-pay | Admitting: Endocrinology

## 2020-11-09 ENCOUNTER — Other Ambulatory Visit: Payer: Self-pay | Admitting: Endocrinology

## 2020-11-09 DIAGNOSIS — E785 Hyperlipidemia, unspecified: Secondary | ICD-10-CM

## 2020-11-09 MED ORDER — ROSUVASTATIN CALCIUM 20 MG PO TABS
ORAL_TABLET | ORAL | 0 refills | Status: DC
Start: 2020-11-09 — End: 2020-12-14

## 2020-11-24 ENCOUNTER — Other Ambulatory Visit: Payer: Self-pay | Admitting: Endocrinology

## 2020-11-24 DIAGNOSIS — E1169 Type 2 diabetes mellitus with other specified complication: Secondary | ICD-10-CM

## 2020-11-24 DIAGNOSIS — E669 Obesity, unspecified: Secondary | ICD-10-CM

## 2020-11-27 ENCOUNTER — Other Ambulatory Visit: Payer: Self-pay | Admitting: Endocrinology

## 2020-11-27 DIAGNOSIS — E118 Type 2 diabetes mellitus with unspecified complications: Secondary | ICD-10-CM

## 2020-11-29 ENCOUNTER — Ambulatory Visit: Payer: No Typology Code available for payment source | Admitting: Endocrinology

## 2020-12-13 ENCOUNTER — Other Ambulatory Visit: Payer: Self-pay | Admitting: Internal Medicine

## 2020-12-13 DIAGNOSIS — E139 Other specified diabetes mellitus without complications: Secondary | ICD-10-CM

## 2020-12-13 DIAGNOSIS — I10 Essential (primary) hypertension: Secondary | ICD-10-CM

## 2020-12-14 ENCOUNTER — Other Ambulatory Visit: Payer: Self-pay | Admitting: Internal Medicine

## 2020-12-14 DIAGNOSIS — E785 Hyperlipidemia, unspecified: Secondary | ICD-10-CM

## 2020-12-27 ENCOUNTER — Other Ambulatory Visit: Payer: Self-pay | Admitting: Endocrinology

## 2020-12-27 DIAGNOSIS — E118 Type 2 diabetes mellitus with unspecified complications: Secondary | ICD-10-CM

## 2021-01-19 ENCOUNTER — Other Ambulatory Visit: Payer: Self-pay | Admitting: Endocrinology

## 2021-01-19 DIAGNOSIS — E669 Obesity, unspecified: Secondary | ICD-10-CM

## 2021-01-19 DIAGNOSIS — E1169 Type 2 diabetes mellitus with other specified complication: Secondary | ICD-10-CM

## 2021-01-22 ENCOUNTER — Ambulatory Visit: Payer: No Typology Code available for payment source | Admitting: Internal Medicine

## 2021-01-25 ENCOUNTER — Encounter: Payer: Self-pay | Admitting: Endocrinology

## 2021-01-25 ENCOUNTER — Other Ambulatory Visit: Payer: Self-pay

## 2021-01-25 ENCOUNTER — Ambulatory Visit (INDEPENDENT_AMBULATORY_CARE_PROVIDER_SITE_OTHER): Payer: No Typology Code available for payment source | Admitting: Endocrinology

## 2021-01-25 VITALS — BP 118/76 | HR 86 | Ht 72.0 in | Wt 284.6 lb

## 2021-01-25 DIAGNOSIS — I10 Essential (primary) hypertension: Secondary | ICD-10-CM | POA: Diagnosis not present

## 2021-01-25 DIAGNOSIS — E785 Hyperlipidemia, unspecified: Secondary | ICD-10-CM

## 2021-01-25 DIAGNOSIS — E1169 Type 2 diabetes mellitus with other specified complication: Secondary | ICD-10-CM | POA: Diagnosis not present

## 2021-01-25 DIAGNOSIS — E669 Obesity, unspecified: Secondary | ICD-10-CM

## 2021-01-25 LAB — BASIC METABOLIC PANEL
BUN: 10 mg/dL (ref 6–23)
CO2: 29 mEq/L (ref 19–32)
Calcium: 9.7 mg/dL (ref 8.4–10.5)
Chloride: 103 mEq/L (ref 96–112)
Creatinine, Ser: 0.76 mg/dL (ref 0.40–1.50)
GFR: 106.2 mL/min (ref >60.00)
Glucose, Bld: 104 mg/dL — ABNORMAL HIGH (ref 70–99)
Potassium: 4.1 mEq/L (ref 3.5–5.1)
Sodium: 140 mEq/L (ref 135–145)

## 2021-01-25 LAB — POCT GLYCOSYLATED HEMOGLOBIN (HGB A1C): Hemoglobin A1C: 5.9 % — AB (ref 4.0–5.6)

## 2021-01-25 LAB — LIPID PANEL
Cholesterol: 117 mg/dL (ref 0–200)
HDL: 44.4 mg/dL (ref >39.00)
LDL Cholesterol: 61 mg/dL (ref 0–99)
NonHDL: 72.74
Total CHOL/HDL Ratio: 3
Triglycerides: 60 mg/dL (ref 0.0–149.0)
VLDL: 12 mg/dL (ref 0.0–40.0)

## 2021-01-25 NOTE — Progress Notes (Signed)
Patient ID: Tyler Benton, male   DOB: [birth year], 50 y.o.   MRN: [id number]            Reason for Appointment: Follow-up for Type 2 Diabetes and hypertension  Referring physician: Sanda Linger  History of Present Illness:          Diagnosis: Type 2 diabetes mellitus, date of diagnosis: 2014       Past history: He had routine labs in 2014 which showed A1c of 6.6 and glucose of 158 fasting This was felt to be borderline and he was not started on any medication He was given Belviq for weight loss but he did not continue this after 2 weeks since he did not help and he found it expensive He was referred for evaluation because of poor blood sugar control in 8/15 and his blood sugar was significantly high with fasting glucose of 266 and A1c of 9.2 This was despite his trying to lose weight and he had lost 30 pounds along with exercising regularly His PCP had started treatment of his diabetes with a combination treatment using Victoza and Xigduo He was having poor control of his diabetes with A1c 9.2 in 2/17 because of noncompliance with on measures of self-care and medications and having symptomatic hyperglycemia  A1c done in 12/19 had gone up to 13%  Recent history:    Non-insulin hypoglycemic drugs : Rybelsus 14 mg daily, Synjardy XR 12.06/998, 2 tablets daily  His A1c is consistently in the normal range and now about the same at 5.9  A1c lower than expected for actual blood sugars   Current management, blood sugar patterns and problems identified: He forgot his meter previously, today has his meter but it has blood sugars only for the last 3 days He thinks however that he is checking his blood sugar daily, also he thinks that he had new test trips and these are not expired His blood sugars appear to be significantly high fasting at times but he thinks this is mostly from eating sweets in the last couple of weeks Otherwise he thinks his blood sugars in the mornings are no more than  130+ Usually not checking readings after meals He has however lost significant amount of weight with exercising much more regularly He thinks he is continuing to eat healthy meals and smaller portions Does not think he has missed any doses No side effects from Rybelsus      Side effects from medications have been:  none  His dinnertime is variable in the evenings, usually first meal of the day is about 12 noon  Glucose monitoring:  Less than once a day on an average      Glucometer: One touch Verio   Blood Glucose readings from download:   FASTING 255, 155 Afternoon 112-147 Total 5 readings    Self-care: He has been consistent with diet usually   Meals: 3 meals per day. Breakfast is protein shake or oatmeal, lunch is frequently salad or sandwich. Dinner usually sandwich         Dietician visit, most recent: 10/15            Weight history: was 380 in 2014  Wt Readings from Last 3 Encounters:  01/25/21 284 lb 9.6 oz (129.1 kg)  08/29/20 (!) 300 lb 9.6 oz (136.4 kg)  07/27/20 297 lb (134.7 kg)    Glycemic control:   Lab Results  Component Value Date   HGBA1C 5.9 (A) 01/25/2021   HGBA1C 6.0 07/27/2020  HGBA1C 5.4 03/08/2020   Lab Results  Component Value Date   MICROALBUR <0.7 08/29/2020   LDLCALC 132 (H) 08/29/2020   CREATININE 0.91 08/29/2020     Other active problems: See review of systems   Allergies as of 01/25/2021   No Known Allergies      Medication List        Accurate as of January 25, 2021  1:42 PM. If you have any questions, ask your nurse or doctor.          amLODipine 5 MG tablet Commonly known as: NORVASC TAKE 1 TABLET(5 MG) BY MOUTH DAILY   ezetimibe 10 MG tablet Commonly known as: Zetia Take 1 tablet (10 mg total) by mouth daily.   Gvoke HypoPen 2-Pack 1 MG/0.2ML Soaj Generic drug: Glucagon Inject 1 Act into the skin daily as needed.   hydrochlorothiazide 12.5 MG capsule Commonly known as: MICROZIDE TAKE 1 CAPSULE(12.5  MG) BY MOUTH DAILY   Insulin Lispro Prot & Lispro (75-25) 100 UNIT/ML Kwikpen Commonly known as: HUMALOG 75/25 MIX INJECT 30 UNITS INTO THE SKIN DAILY BEFORE SUPPER, INJECT 32 UNITS UNDER THE SKIN THREE TIMES DAILY BEFORE MEALS IF BLOOD SUGAR IS OVER 130   Insulin Pen Needle 31G X 5 MM Misc Use on insulin pen 2x daily   irbesartan 150 MG tablet Commonly known as: AVAPRO TAKE 1 TABLET(150 MG) BY MOUTH DAILY   nabumetone 750 MG tablet Commonly known as: RELAFEN TAKE 1 TABLET(750 MG) BY MOUTH TWICE DAILY WITH FOOD AS NEEDED FOR MILD PAIN OR MODERATE PAIN   ONE TOUCH LANCETS Misc Use to test blood sugar once daily   OneTouch Verio test strip Generic drug: glucose blood USE TO TEST BLOOD SUGAR ONCE DAILY AS DIRECTED   rosuvastatin 20 MG tablet Commonly known as: CRESTOR TAKE 1 TABLET(20 MG) BY MOUTH DAILY   Rybelsus 14 MG Tabs Generic drug: Semaglutide TAKE 1 TABLET BY MOUTH EVERY DAY   sildenafil 20 MG tablet Commonly known as: REVATIO Take 4 tablets (80 mg total) by mouth daily as needed.   Synjardy XR 12.06-998 MG Tb24 Generic drug: Empagliflozin-metFORMIN HCl ER TAKE 2 TABLETS BY MOUTH DAILY WITH BREAKFAST        Allergies: No Known Allergies  Past Medical History:  Diagnosis Date   Diabetes mellitus without complication (HCC)    GERD (gastroesophageal reflux disease)    Obesity     Past Surgical History:  Procedure Laterality Date   GROWTH PLATE SURGERY Bilateral 1980s   WISDOM TOOTH EXTRACTION      Family History  Problem Relation Age of Onset   Heart disease Mother    Hypertension Mother    Early death Neg Hx    Alcohol abuse Neg Hx    Birth defects Neg Hx    Cancer Neg Hx    COPD Neg Hx    Drug abuse Neg Hx    Hyperlipidemia Neg Hx    Kidney disease Neg Hx    Stroke Neg Hx    Thyroid disease Neg Hx    Diabetes Maternal Grandfather     Social History:  reports that he has never smoked. He has never used smokeless tobacco. He reports that  he does not drink alcohol and does not use drugs.    Review of Systems         Lipids: He has been prescribed Crestor 20 mg daily by his PCP, labs as follows On his last visit he apparently had not taken his Crestor  regularly       Lab Results  Component Value Date   CHOL 203 (H) 08/29/2020   HDL 41.10 08/29/2020   LDLCALC 132 (H) 08/29/2020   LDLDIRECT 141.3 01/12/2014   TRIG 152.0 (H) 08/29/2020   CHOLHDL 5 08/29/2020                  Thyroid: He was found to have left-sided thyroid nodule on initial exam and this was benign on biopsy On exam he has had a 2.5-3 cm nodule on the left side  Lab Results  Component Value Date   TSH 1.37 05/20/2019    HYPERTENSION: He is now on amlodipine 5 mg and Avapro 150 mg daily Previously blood pressure was high because of not tolerating HCTZ which caused excessive urination   He has been checking blood pressure at work occasionally, reading <130/80     BP Readings from Last 3 Encounters:  01/25/21 118/76  08/29/20 (!) 138/92  07/27/20 138/80     LABS:  Office Visit on 01/25/2021  Component Date Value Ref Range Status   Hemoglobin A1C 01/25/2021 5.9 (A)  4.0 - 5.6 % Final    Physical Examination:  BP 118/76    Pulse 86    Ht 6' (1.829 m)    Wt 284 lb 9.6 oz (129.1 kg)    SpO2 96%    BMI 38.60 kg/m     ASSESSMENT/PLAN:  Diabetes type 2 with obesity  See history of present illness for detailed discussion of his current management, blood sugar patterns and problems identified  A1c is again lower than expected at 5.9 % His A1c is likely falsely low  Currently on 14 mg Rybelsus and Synjardy 12.06/998  He has made lifestyle changes lost some weight especially with exercise However he is not regular with monitoring his blood sugars  He thinks his fasting readings in this last few days are high because of eating sweets at night  Unable to see what his blood sugars are after supper as these are not monitored Since his  A1c is stable he will continue the same regimen We will also check fructosamine on the next visit to correlate with A1c    HYPERTENSION: Blood pressure is better today   Lipids: He will have labs checked today, recently has been taking his Crestor regularly    Follow-up in 4 months   There are no Patient Instructions on file for this visit.   Reather Littler 01/25/2021, 1:42 PM   Note: This office note was prepared with Dragon voice recognition system technology. Any transcriptional errors that result from this process are unintentional.

## 2021-01-25 NOTE — Patient Instructions (Addendum)
Check blood sugars on waking up 3 days a week  Also check blood sugars about 2 hours after meals and do this after different meals by rotation  Recommended blood sugar levels on waking up are 90-130 and about 2 hours after meal is 130-160  Please bring your blood sugar monitor to each visit, thank you  Call if am sugar stays hi

## 2021-02-20 ENCOUNTER — Other Ambulatory Visit: Payer: Self-pay | Admitting: Endocrinology

## 2021-02-26 ENCOUNTER — Other Ambulatory Visit: Payer: Self-pay

## 2021-02-26 ENCOUNTER — Ambulatory Visit (INDEPENDENT_AMBULATORY_CARE_PROVIDER_SITE_OTHER): Payer: No Typology Code available for payment source | Admitting: Internal Medicine

## 2021-02-26 ENCOUNTER — Encounter: Payer: Self-pay | Admitting: Internal Medicine

## 2021-02-26 DIAGNOSIS — I10 Essential (primary) hypertension: Secondary | ICD-10-CM | POA: Diagnosis not present

## 2021-02-26 DIAGNOSIS — Z23 Encounter for immunization: Secondary | ICD-10-CM

## 2021-02-26 DIAGNOSIS — E139 Other specified diabetes mellitus without complications: Secondary | ICD-10-CM

## 2021-02-26 MED ORDER — IRBESARTAN 150 MG PO TABS: 150.0000 mg | ORAL_TABLET | Freq: Every day | ORAL | 1 refills | Status: DC

## 2021-02-26 MED ORDER — INDAPAMIDE 1.25 MG PO TABS: 1.2500 mg | ORAL_TABLET | Freq: Every day | ORAL | 0 refills | Status: DC

## 2021-02-26 NOTE — Progress Notes (Signed)
Subjective:  Patient ID: Tyler Benton, male    DOB: [birth year]  Age: 51 y.o. MRN: [id number]  CC: Hypertension  This visit occurred during the SARS-CoV-2 public health emergency.  Safety protocols were in place, including screening questions prior to the visit, additional usage of staff PPE, and extensive cleaning of exam room while observing appropriate contact time as indicated for disinfecting solutions.    HPI Tyler Benton presents for f/up -  Tyler Benton has not taken hydrochlorothiazide in 2 weeks.  Tyler Benton has an exercise regimen and is active but denies chest pain, shortness of breath, diaphoresis, dizziness, lightheadedness, or edema.  Outpatient Medications Prior to Visit  Medication Sig Dispense Refill   amLODipine (NORVASC) 5 MG tablet TAKE 1 TABLET(5 MG) BY MOUTH DAILY 30 tablet 3   ezetimibe (ZETIA) 10 MG tablet Take 1 tablet (10 mg total) by mouth daily. 30 tablet 3   Glucagon (GVOKE HYPOPEN 2-PACK) 1 MG/0.2ML SOAJ Inject 1 Act into the skin daily as needed. 2 mL 5   glucose blood (ONETOUCH VERIO) test strip USE TO TEST BLOOD SUGAR ONCE DAILY AS DIRECTED 100 strip 2   Insulin Lispro Prot & Lispro (HUMALOG 75/25 MIX) (75-25) 100 UNIT/ML Kwikpen INJECT 30 UNITS INTO THE SKIN DAILY BEFORE SUPPER, INJECT 32 UNITS UNDER THE SKIN THREE TIMES DAILY BEFORE MEALS IF BLOOD SUGAR IS OVER 130 30 mL 1   Insulin Pen Needle 31G X 5 MM MISC Use on insulin pen 2x daily 100 each 1   nabumetone (RELAFEN) 750 MG tablet TAKE 1 TABLET(750 MG) BY MOUTH TWICE DAILY WITH FOOD AS NEEDED FOR MILD PAIN OR MODERATE PAIN 60 tablet 3   ONE TOUCH LANCETS MISC Use to test blood sugar once daily 200 each 1   rosuvastatin (CRESTOR) 20 MG tablet TAKE 1 TABLET(20 MG) BY MOUTH DAILY 90 tablet 0   Semaglutide (RYBELSUS) 14 MG TABS TAKE 1 TABLET BY MOUTH EVERY DAY 30 tablet 2   sildenafil (REVATIO) 20 MG tablet Take 4 tablets (80 mg total) by mouth daily as needed. 60 tablet 5   SYNJARDY XR 12.06-998 MG TB24 TAKE 2  TABLETS BY MOUTH DAILY WITH BREAKFAST 60 tablet 2   hydrochlorothiazide (MICROZIDE) 12.5 MG capsule TAKE 1 CAPSULE(12.5 MG) BY MOUTH DAILY 30 capsule 1   irbesartan (AVAPRO) 150 MG tablet TAKE 1 TABLET(150 MG) BY MOUTH DAILY 90 tablet 1   No facility-administered medications prior to visit.    ROS Review of Systems  Constitutional:  Negative for chills, diaphoresis, fatigue and fever.  HENT: Negative.    Eyes: Negative.   Respiratory:  Negative for cough, chest tightness, shortness of breath and wheezing.   Cardiovascular:  Negative for chest pain, palpitations and leg swelling.  Gastrointestinal:  Negative for abdominal pain, constipation, diarrhea, nausea and vomiting.  Endocrine: Negative.   Genitourinary: Negative.  Negative for difficulty urinating.  Musculoskeletal: Negative.  Negative for arthralgias and myalgias.  Skin: Negative.   Neurological:  Negative for dizziness, weakness, light-headedness, numbness and headaches.  Hematological:  Negative for adenopathy. Does not bruise/bleed easily.  Psychiatric/Behavioral: Negative.     Objective:  BP 138/88 (BP Location: Right Arm, Patient Position: Sitting, Cuff Size: Large)    Pulse 95    Temp 98.4 F (36.9 C) (Oral)    Resp 16    Ht 6' (1.829 m)    Wt 289 lb (131.1 kg)    SpO2 99%    BMI 39.20 kg/m   BP Readings from Last 3  Encounters:  02/26/21 138/88  01/25/21 118/76  08/29/20 (!) 138/92    Wt Readings from Last 3 Encounters:  02/26/21 289 lb (131.1 kg)  01/25/21 284 lb 9.6 oz (129.1 kg)  08/29/20 (!) 300 lb 9.6 oz (136.4 kg)    Physical Exam Vitals reviewed.  HENT:     Nose: Nose normal.     Mouth/Throat:     Mouth: Mucous membranes are moist.  Eyes:     General: No scleral icterus.    Conjunctiva/sclera: Conjunctivae normal.  Cardiovascular:     Rate and Rhythm: Normal rate and regular rhythm.     Heart sounds: No murmur heard. Pulmonary:     Effort: Pulmonary effort is normal.     Breath sounds: No  stridor. No wheezing, rhonchi or rales.  Abdominal:     General: Abdomen is flat.     Palpations: There is no mass.     Tenderness: There is no abdominal tenderness. There is no guarding.     Hernia: No hernia is present.  Musculoskeletal:        General: Normal range of motion.     Cervical back: Neck supple.     Right lower leg: No edema.     Left lower leg: No edema.  Lymphadenopathy:     Cervical: No cervical adenopathy.  Skin:    General: Skin is warm and dry.  Neurological:     General: No focal deficit present.     Mental Status: Tyler Benton is alert.  Psychiatric:        Mood and Affect: Mood normal.        Behavior: Behavior normal.    Lab Results  Component Value Date   WBC 7.2 07/27/2020   HGB 16.8 07/27/2020   HCT 49.9 07/27/2020   PLT 268.0 07/27/2020   GLUCOSE 104 (H) 01/25/2021   CHOL 117 01/25/2021   TRIG 60.0 01/25/2021   HDL 44.40 01/25/2021   LDLDIRECT 141.3 01/12/2014   LDLCALC 61 01/25/2021   ALT 33 08/29/2020   AST 23 08/29/2020   NA 140 01/25/2021   K 4.1 01/25/2021   CL 103 01/25/2021   CREATININE 0.76 01/25/2021   BUN 10 01/25/2021   CO2 29 01/25/2021   TSH 1.37 05/20/2019   PSA 0.42 07/27/2020   HGBA1C 5.9 (A) 01/25/2021   MICROALBUR <0.7 08/29/2020    DG Lumbar Spine Complete  Result Date: 02/12/2017 CLINICAL DATA:  Chronic low back pain, worse with standing. EXAM: LUMBAR SPINE - COMPLETE 4+ VIEW COMPARISON:  None. FINDINGS: Five lumbar type vertebral bodies show normal alignment. Mild disc space narrowing L4-5. Mild lower lumbar facet arthritis. No pars defect or slippage. Sacroiliac joints appear normal. IMPRESSION: Mild disc space narrowing L4-5.  No acute finding. Electronically Signed   By: Nelson Chimes M.D.   On: 02/12/2017 15:20   DG Shoulder Right  Result Date: 02/12/2017 CLINICAL DATA:  Shoulder pain after injury 4 months ago. EXAM: RIGHT SHOULDER - 2+ VIEW COMPARISON:  None. FINDINGS: Glenohumeral joint appears normal. Narrowing of the  humeral acromial distance suggesting rotator cuff pathology. Greater tuberosity osteophyte formation. AC joint degenerative change. IMPRESSION: No acute or traumatic finding. Narrowing of the humeral acromial distance. Greater tuberosity osteophyte formation. AC joint osteoarthritis. Electronically Signed   By: Nelson Chimes M.D.   On: 02/12/2017 15:21   [place] LIMITED JOINT SPACE STRUCTURES UP RIGHT  Result Date: 02/22/2017 [facility] US performed of: Right This study was ordered, performed, and interpreted by Charlann Boxer  D.O.   Shoulder:  Supraspinatus: Articular sided tear noted with what appears to be a very small Hill-Sachs deformity  bursal bulge seen with shoulder abduction on impingement view. Subscapularis:  Appears normal on long and transverse views. Positive bursa Teres Minor:  Appears normal on long and transverse views. AC joint: Arthritic changes Glenohumeral Joint:  Appears normal without effusion. Glenoid Labrum:  Intact without visualized tears. Biceps Tendon:  Appears normal on long and transverse views, no fraying of tendon, tendon located in intertubercular groove, no subluxation with shoulder internal or external rotation.   Impression: Partial rotator cuff tear   Assessment & Plan:   Tyler Benton was seen today for hypertension.  Diagnoses and all orders for this visit:  Diabetes 1.5, managed as type 1 ([place])- His blood sugar has been adequately well controlled. -     irbesartan (AVAPRO) 150 MG tablet; Take 1 tablet (150 mg total) by mouth daily.  Essential hypertension- Tyler Benton has not achieved his blood pressure goal of 130/80.  Will continue the current dose of the CCB and ARB but will restart a thiazide diuretic. -     indapamide (LOZOL) 1.25 MG tablet; Take 1 tablet (1.25 mg total) by mouth daily. -     irbesartan (AVAPRO) 150 MG tablet; Take 1 tablet (150 mg total) by mouth daily.   I have discontinued Tyler Benton "Eddy"'s hydrochlorothiazide. I have also changed his irbesartan.  Additionally, I am having him start on indapamide. Lastly, I am having him maintain his ONE TOUCH LANCETS, Insulin Pen Needle, Insulin Lispro Prot & Lispro, Gvoke HypoPen 2-Pack, sildenafil, nabumetone, ezetimibe, Synjardy XR, rosuvastatin, Rybelsus, OneTouch Verio, and amLODipine.  Meds ordered this encounter  Medications   indapamide (LOZOL) 1.25 MG tablet    Sig: Take 1 tablet (1.25 mg total) by mouth daily.    Dispense:  90 tablet    Refill:  0   irbesartan (AVAPRO) 150 MG tablet    Sig: Take 1 tablet (150 mg total) by mouth daily.    Dispense:  90 tablet    Refill:  1     Follow-up: Return in about 3 months (around 05/27/2021).  Scarlette Calico, MD

## 2021-02-26 NOTE — Patient Instructions (Signed)

## 2021-03-01 ENCOUNTER — Other Ambulatory Visit: Payer: Self-pay | Admitting: Endocrinology

## 2021-03-01 DIAGNOSIS — E1169 Type 2 diabetes mellitus with other specified complication: Secondary | ICD-10-CM

## 2021-03-26 ENCOUNTER — Other Ambulatory Visit: Payer: Self-pay | Admitting: Internal Medicine

## 2021-03-26 DIAGNOSIS — E139 Other specified diabetes mellitus without complications: Secondary | ICD-10-CM

## 2021-03-26 DIAGNOSIS — I10 Essential (primary) hypertension: Secondary | ICD-10-CM

## 2021-03-29 ENCOUNTER — Other Ambulatory Visit: Payer: Self-pay

## 2021-03-29 DIAGNOSIS — E785 Hyperlipidemia, unspecified: Secondary | ICD-10-CM

## 2021-03-29 MED ORDER — ROSUVASTATIN CALCIUM 20 MG PO TABS: ORAL_TABLET | ORAL | 2 refills | Status: DC

## 2021-05-25 ENCOUNTER — Other Ambulatory Visit (HOSPITAL_COMMUNITY): Payer: Self-pay

## 2021-05-31 ENCOUNTER — Encounter: Payer: Self-pay | Admitting: Endocrinology

## 2021-05-31 ENCOUNTER — Encounter: Payer: Self-pay | Admitting: Internal Medicine

## 2021-05-31 ENCOUNTER — Ambulatory Visit (INDEPENDENT_AMBULATORY_CARE_PROVIDER_SITE_OTHER): Payer: No Typology Code available for payment source | Admitting: Internal Medicine

## 2021-05-31 ENCOUNTER — Ambulatory Visit (INDEPENDENT_AMBULATORY_CARE_PROVIDER_SITE_OTHER): Payer: No Typology Code available for payment source | Admitting: Endocrinology

## 2021-05-31 VITALS — BP 128/84 | HR 90 | Ht 72.0 in | Wt 290.2 lb

## 2021-05-31 VITALS — BP 138/88 | HR 78 | Temp 98.4°F | Ht 72.0 in | Wt 290.0 lb

## 2021-05-31 DIAGNOSIS — M1711 Unilateral primary osteoarthritis, right knee: Secondary | ICD-10-CM | POA: Diagnosis not present

## 2021-05-31 DIAGNOSIS — I10 Essential (primary) hypertension: Secondary | ICD-10-CM

## 2021-05-31 DIAGNOSIS — E1169 Type 2 diabetes mellitus with other specified complication: Secondary | ICD-10-CM

## 2021-05-31 DIAGNOSIS — E669 Obesity, unspecified: Secondary | ICD-10-CM | POA: Diagnosis not present

## 2021-05-31 LAB — POCT GLYCOSYLATED HEMOGLOBIN (HGB A1C): Hemoglobin A1C: 5.7 % — AB (ref 4.0–5.6)

## 2021-05-31 MED ORDER — ONETOUCH VERIO W/DEVICE KIT: PACK | 0 refills | Status: DC

## 2021-05-31 MED ORDER — ONETOUCH DELICA LANCETS 33G MISC: 2 refills | Status: AC

## 2021-05-31 MED ORDER — ONETOUCH VERIO VI STRP: ORAL_STRIP | 2 refills | Status: DC

## 2021-05-31 NOTE — Progress Notes (Signed)
? ?Subjective:  ?Patient ID: Tyler Benton, male    DOB: Jan 26, 1971  Age: 51 y.o. MRN: [id number] ? ?CC: Hypertension and Osteoarthritis ? ? ?HPI ?Tyler Benton presents for f/up - ? ?He complains of chronic right knee pain and would like to see an orthopedist to see if he is a candidate for surgery.  He is no longer taking indapamide because it caused hypotension with dizziness and lightheadedness.  Those symptoms have resolved.  He is active and denies chest pain, shortness of breath, diaphoresis, or edema. ? ?Outpatient Medications Prior to Visit  ?Medication Sig Dispense Refill  ? amLODipine (NORVASC) 5 MG tablet TAKE 1 TABLET(5 MG) BY MOUTH DAILY 30 tablet 3  ? Blood Glucose Monitoring Suppl (ONETOUCH VERIO) w/Device KIT Use to check blood sugar once a day 1 kit 0  ? ezetimibe (ZETIA) 10 MG tablet Take 1 tablet (10 mg total) by mouth daily. 30 tablet 3  ? Glucagon (GVOKE HYPOPEN 2-PACK) 1 MG/0.2ML SOAJ Inject 1 Act into the skin daily as needed. 2 mL 5  ? glucose blood (ONETOUCH VERIO) test strip USE TO TEST BLOOD SUGAR ONCE DAILY AS DIRECTED 100 strip 2  ? Insulin Lispro Prot & Lispro (HUMALOG 75/25 MIX) (75-25) 100 UNIT/ML Kwikpen INJECT 30 UNITS INTO THE SKIN DAILY BEFORE SUPPER, INJECT 32 UNITS UNDER THE SKIN THREE TIMES DAILY BEFORE MEALS IF BLOOD SUGAR IS OVER 130 30 mL 1  ? Insulin Pen Needle 31G X 5 MM MISC Use on insulin pen 2x daily 100 each 1  ? irbesartan (AVAPRO) 150 MG tablet TAKE 1 TABLET(150 MG) BY MOUTH DAILY 90 tablet 0  ? nabumetone (RELAFEN) 750 MG tablet TAKE 1 TABLET(750 MG) BY MOUTH TWICE DAILY WITH FOOD AS NEEDED FOR MILD PAIN OR MODERATE PAIN 60 tablet 3  ? OneTouch Delica Lancets 38H MISC Use to check blood sugar once a day 100 each 2  ? rosuvastatin (CRESTOR) 20 MG tablet TAKE 1 TABLET(20 MG) BY MOUTH DAILY 90 tablet 2  ? Semaglutide (RYBELSUS) 14 MG TABS TAKE 1 TABLET BY MOUTH EVERY DAY 30 tablet 2  ? sildenafil (REVATIO) 20 MG tablet Take 4 tablets (80 mg total) by mouth daily as  needed. 60 tablet 5  ? SYNJARDY XR 12.06-998 MG TB24 TAKE 2 TABLETS BY MOUTH DAILY WITH BREAKFAST 60 tablet 2  ? ?No facility-administered medications prior to visit.  ? ? ?ROS ?Review of Systems  ?Constitutional: Negative.  Negative for diaphoresis and fatigue.  ?HENT: Negative.    ?Eyes: Negative.   ?Respiratory:  Negative for cough, chest tightness, shortness of breath and wheezing.   ?Cardiovascular:  Negative for chest pain, palpitations and leg swelling.  ?Gastrointestinal:  Negative for abdominal pain, constipation, diarrhea, nausea and vomiting.  ?Endocrine: Negative.   ?Genitourinary: Negative.  Negative for difficulty urinating.  ?Musculoskeletal:  Positive for arthralgias. Negative for back pain and myalgias.  ?Skin: Negative.   ?Neurological: Negative.  Negative for dizziness, weakness and light-headedness.  ?Hematological:  Negative for adenopathy. Does not bruise/bleed easily.  ?Psychiatric/Behavioral: Negative.    ? ?Objective:  ?BP 138/88 (BP Location: Left Arm, Patient Position: Sitting, Cuff Size: Large)   Pulse 78   Temp 98.4 ?F (36.9 ?C) (Oral)   Ht 6' (1.829 m)   Wt 290 lb (131.5 kg)   SpO2 95%   BMI 39.33 kg/m?  ? ?BP Readings from Last 3 Encounters:  ?05/31/21 138/88  ?05/31/21 128/84  ?02/26/21 138/88  ? ? ?Wt Readings from Last 3 Encounters:  ?  05/31/21 290 lb (131.5 kg)  ?05/31/21 290 lb 3.2 oz (131.6 kg)  ?02/26/21 289 lb (131.1 kg)  ? ? ?Physical Exam ?Vitals reviewed.  ?HENT:  ?   Nose: Nose normal.  ?   Mouth/Throat:  ?   Mouth: Mucous membranes are moist.  ?Eyes:  ?   General: No scleral icterus. ?   Conjunctiva/sclera: Conjunctivae normal.  ?Cardiovascular:  ?   Rate and Rhythm: Normal rate and regular rhythm.  ?   Heart sounds: No murmur heard. ?Pulmonary:  ?   Effort: Pulmonary effort is normal.  ?   Breath sounds: No stridor. No wheezing, rhonchi or rales.  ?Abdominal:  ?   General: Abdomen is flat.  ?   Palpations: There is no mass.  ?   Tenderness: There is no abdominal  tenderness. There is no guarding.  ?   Hernia: No hernia is present.  ?Musculoskeletal:     ?   General: Deformity (DJD) present. No swelling or tenderness.  ?   Cervical back: Neck supple.  ?   Right lower leg: No edema.  ?   Left lower leg: No edema.  ?Lymphadenopathy:  ?   Cervical: No cervical adenopathy.  ?Skin: ?   General: Skin is warm.  ?Neurological:  ?   General: No focal deficit present.  ?   Mental Status: He is alert.  ?Psychiatric:     ?   Mood and Affect: Mood normal.     ?   Behavior: Behavior normal.  ? ? ?Lab Results  ?Component Value Date  ? WBC 7.2 07/27/2020  ? HGB 16.8 07/27/2020  ? HCT 49.9 07/27/2020  ? PLT 268.0 07/27/2020  ? GLUCOSE 104 (H) 01/25/2021  ? CHOL 117 01/25/2021  ? TRIG 60.0 01/25/2021  ? HDL 44.40 01/25/2021  ? LDLDIRECT 141.3 01/12/2014  ? [place] 61 01/25/2021  ? ALT 33 08/29/2020  ? AST 23 08/29/2020  ? NA 140 01/25/2021  ? K 4.1 01/25/2021  ? CL 103 01/25/2021  ? CREATININE 0.76 01/25/2021  ? BUN 10 01/25/2021  ? CO2 29 01/25/2021  ? TSH 1.37 05/20/2019  ? PSA 0.42 07/27/2020  ? HGBA1C 5.7 (A) 05/31/2021  ? MICROALBUR <0.7 08/29/2020  ? ? ?DG Lumbar Spine Complete ? ?Result Date: 02/12/2017 ?CLINICAL DATA:  Chronic low back pain, worse with standing. EXAM: LUMBAR SPINE - COMPLETE 4+ VIEW COMPARISON:  None. FINDINGS: Five lumbar type vertebral bodies show normal alignment. Mild disc space narrowing L4-5. Mild lower lumbar facet arthritis. No pars defect or slippage. Sacroiliac joints appear normal. IMPRESSION: Mild disc space narrowing L4-5.  No acute finding. Electronically Signed   By: Nelson Chimes M.D.   On: 02/12/2017 15:20  ? ?DG Shoulder Right ? ?Result Date: 02/12/2017 ?CLINICAL DATA:  Shoulder pain after injury 4 months ago. EXAM: RIGHT SHOULDER - 2+ VIEW COMPARISON:  None. FINDINGS: Glenohumeral joint appears normal. Narrowing of the humeral acromial distance suggesting rotator cuff pathology. Greater tuberosity osteophyte formation. AC joint degenerative change.  IMPRESSION: No acute or traumatic finding. Narrowing of the humeral acromial distance. Greater tuberosity osteophyte formation. AC joint osteoarthritis. Electronically Signed   By: Nelson Chimes M.D.   On: 02/12/2017 15:21  ? ?[place] LIMITED JOINT SPACE STRUCTURES UP RIGHT ? ?Result Date: 02/22/2017 ?MSK US performed of: Right This study was ordered, performed, and interpreted by Charlann Boxer D.O.   Shoulder:  Supraspinatus: Articular sided tear noted with what appears to be a very small Hill-Sachs deformity  bursal bulge  seen with shoulder abduction on impingement view. Subscapularis:  Appears normal on long and transverse views. Positive bursa Teres Minor:  Appears normal on long and transverse views. AC joint: Arthritic changes Glenohumeral Joint:  Appears normal without effusion. Glenoid Labrum:  Intact without visualized tears. Biceps Tendon:  Appears normal on long and transverse views, no fraying of tendon, tendon located in intertubercular groove, no subluxation with shoulder internal or external rotation.   Impression: Partial rotator cuff tear ? ? ?Assessment & Plan:  ? ?Square was seen today for hypertension and osteoarthritis. ? ?Diagnoses and all orders for this visit: ? ?Essential hypertension- He has not achieved his blood pressure goal of 138/80 but did not tolerate the thiazide diuretic.  Will continue the current antihypertensives and I have asked him to continue working on his lifestyle modifications. ? ?Primary osteoarthritis of right knee ?-     Ambulatory referral to Orthopedic Surgery ? ? ?I am having Kline L. Lovena Le "Eddy" maintain his Insulin Pen Needle, Insulin Lispro Prot & Lispro, Gvoke HypoPen 2-Pack, sildenafil, nabumetone, ezetimibe, Rybelsus, amLODipine, Synjardy XR, irbesartan, rosuvastatin, OneTouch Verio, OneTouch Delica Lancets 15U, and OneTouch Verio. ? ?No orders of the defined types were placed in this encounter. ? ? ? ?Follow-up: No follow-ups on file. ? ?Scarlette Calico, MD ?

## 2021-05-31 NOTE — Patient Instructions (Signed)
Check blood sugars on waking up 2-3 days a week ? ?Also check blood sugars about 2 hours after meals and do this after different meals by rotation ? ?Recommended blood sugar levels on waking up are 90-130 and about 2 hours after meal is 130-170 ? ?Please bring your blood sugar monitor to each visit, thank you ? ?

## 2021-05-31 NOTE — Progress Notes (Signed)
Patient ID: Tyler Benton, male   DOB: 07-Nov-1970, 51 y.o.   MRN: [id number] ? ? ?       ? ? ?Reason for Appointment: Follow-up for Type 2 Diabetes and hypertension ? ?Referring physician: Scarlette Benton ? ?History of Present Illness:  ?        ?Diagnosis: Type 2 diabetes mellitus, date of diagnosis: 2014      ? ?Past history: He had routine labs in 2014 which showed A1c of 6.6 and glucose of 158 fasting ?This was felt to be borderline and he was not started on any medication ?He was given Belviq for weight loss but he did not continue this after 2 weeks since he did not help and he found it expensive ?He was referred for evaluation because of poor blood sugar control in 8/15 and his blood sugar was significantly high with fasting glucose of 266 and A1c of 9.2 ?This was despite his trying to lose weight and he had lost 30 pounds along with exercising regularly ?His PCP had started treatment of his diabetes with a combination treatment using Victoza and Xigduo ?He was having poor control of his diabetes with A1c 9.2 in 2/17 because of noncompliance with on measures of self-care and medications and having symptomatic hyperglycemia ? ?A1c done in 12/19 had gone up to 13% ? ?Recent history:  ? ? ?Non-insulin hypoglycemic drugs : Rybelsus 14 mg daily, Synjardy XR 12.06/998, 2 tablets daily ? ?His A1c is consistently in the normal range and now about the same at 5.7 ? ?A1c lower than expected for actual blood sugars  ? ?Current management, blood sugar patterns and problems identified: ? 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     ?Side effects from medications have been:  none ? ?His dinnertime is variable in the evenings, usually first meal of the day is about 12 noon ? ?Glucose monitoring:  Less than once a day on an average      Glucometer: One touch Verio ?  ?Blood Glucose readings from meter review:  ? ? ?PRE-MEAL Fasting Lunch Dinner Bedtime Overall  ?Glucose range: 102-138      ?Mean/median:       ? ?POST-MEAL PC Breakfast PC Lunch PC Dinner  ?Glucose range:     ?Mean/median:     ? ?  ?Self-care: He has been consistent with diet usually ?  ?Meals: 3 meals per day. Breakfast is protein shake or oatmeal, lunch is frequently salad or sandwich. Dinner usually sandwich ?        ?Dietician visit, most recent: 10/15           ? ?Weight history: was 380 in 2014 ? ?Wt Readings from Last 3 Encounters:  ?05/31/21 290 lb (131.5 kg)  ?05/31/21 290 lb 3.2 oz (131.6 kg)  ?02/26/21 289 lb (131.1 kg)  ? ? ?Glycemic control: ?  ?Lab Results  ?Component  Value Date  ? HGBA1C 5.7 (A) 05/31/2021  ? HGBA1C 5.9 (A) 01/25/2021  ? HGBA1C 6.0 07/27/2020  ? ?Lab Results  ?Component Value Date  ? MICROALBUR <0.7 08/29/2020  ? [place] 61 01/25/2021  ? CREATININE 0.76 01/25/2021  ? ? ? ?Other active problems: See review of systems ? ? ?Allergies as of 05/31/2021   ?No Known Allergies ?  ? ?  ?Medication List  ?  ? ?  ? Accurate as of May 31, 2021  3:16 PM. If you have any questions, ask your nurse or doctor.  ?  ?  ? ?  ? ?STOP taking these medications   ? ?indapamide 1.25 MG tablet ?Commonly known as: LOZOL ?Stopped by: Tyler Snare, MD ?  ? ?  ? ?TAKE these medications   ? ?amLODipine 5 MG tablet ?Commonly known as: NORVASC ?TAKE 1 TABLET(5 MG) BY MOUTH DAILY ?  ?ezetimibe 10 MG tablet ?Commonly known as: Zetia ?Take 1 tablet (10 mg total) by mouth daily. ?  ?Gvoke HypoPen 2-Pack 1 MG/0.2ML Soaj ?Generic  drug: Glucagon ?Inject 1 Act into the skin daily as needed. ?  ?Insulin Lispro Prot & Lispro (75-25) 100 UNIT/ML Kwikpen ?Commonly known as: HUMALOG 75/25 MIX ?INJECT 30 UNITS INTO THE SKIN DAILY BEFORE SUPPER, INJECT 32 UNITS UNDER THE SKIN THREE TIMES DAILY BEFORE MEALS IF BLOOD SUGAR IS OVER 130 ?  ?Insulin Pen Needle 31G X 5 MM Misc ?Use on insulin pen 2x daily ?  ?irbesartan 150 MG tablet ?Commonly known as: AVAPRO ?TAKE 1 TABLET(150 MG) BY MOUTH DAILY ?  ?nabumetone 750 MG tablet ?Commonly known as: RELAFEN ?TAKE 1 TABLET(750 MG) BY MOUTH TWICE DAILY WITH FOOD AS NEEDED FOR MILD PAIN OR MODERATE PAIN ?  ?OneTouch Delica Lancets 99J Misc ?Use to check blood sugar once a day ?What changed: additional instructions ?Changed by: Tyler Snare, MD ?  ?OneTouch Verio test strip ?Generic drug: glucose blood ?USE TO TEST BLOOD SUGAR ONCE DAILY AS DIRECTED ?  ?OneTouch Verio w/Device Kit ?Use to check blood sugar once a day ?Started by: Tyler Snare, MD ?  ?rosuvastatin 20 MG tablet ?Commonly known as: CRESTOR ?TAKE 1 TABLET(20 MG) BY MOUTH DAILY ?  ?Rybelsus 14 MG Tabs ?Generic drug: Semaglutide ?TAKE 1 TABLET BY MOUTH EVERY DAY ?  ?sildenafil 20 MG tablet ?Commonly known as: REVATIO ?Take 4 tablets (80 mg total) by mouth daily as needed. ?  ?Synjardy XR 12.06-998 MG Tb24 ?Generic drug: Empagliflozin-metFORMIN HCl ER ?TAKE 2 TABLETS BY MOUTH DAILY WITH BREAKFAST ?  ? ?  ? ? ?Allergies: No Known Allergies ? ?Past Medical History:  ?Diagnosis Date  ? Diabetes mellitus without complication ([place])   ? GERD (gastroesophageal reflux disease)   ? Obesity   ? ? ?Past Surgical History:  ?Procedure Laterality Date  ? GROWTH PLATE SURGERY Bilateral 1980s  ? WISDOM TOOTH EXTRACTION    ? ? ?Family History  ?Problem Relation Age of Onset  ? Heart disease Mother   ? Hypertension Mother   ? Early death Neg Hx   ? Alcohol abuse Neg Hx   ? Birth defects Neg Hx   ? Cancer Neg Hx   ? COPD Neg Hx   ? Drug abuse Neg Hx   ? Hyperlipidemia Neg Hx    ? Kidney disease Neg Hx   ? Stroke Neg Hx   ? Thyroid disease Neg Hx   ? Diabetes Maternal Grandfather   ? ? ?Social History:  reports that he has never  smoked. He has never used smokeless tobacco. He reports that he does not drink alcohol and does not use drugs. ? ?  ?Review of Systems  ? ?  ?    Lipids: He has been prescribed Crestor 20 mg daily by his PCP, labs as follows ? ?  ?    ?Lab Results  ?Component Value Date  ? CHOL 117 01/25/2021  ? HDL 44.40 01/25/2021  ? [place] 61 01/25/2021  ? LDLDIRECT 141.3 01/12/2014  ? TRIG 60.0 01/25/2021  ? CHOLHDL 3 01/25/2021  ?            ? ?   Thyroid: ?He was found to have left-sided thyroid nodule on initial exam and this was benign on biopsy ?On exam he has had a 2.5-3 cm nodule on the left side ? ?Lab Results  ?Component Value Date  ? TSH 1.37 05/20/2019  ? ? ?HYPERTENSION: He is still on amlodipine 5 mg and Avapro 150 mg daily ?Previously blood pressure was high because of not tolerating HCTZ which caused excessive urination ?Indapamide caused her blood pressure to be as low as 90 ? ?He has been checking blood pressure at work periodically, recent range <128-138/80-88 ?  ? ?BP Readings from Last 3 Encounters:  ?05/31/21 138/88  ?05/31/21 128/84  ?02/26/21 138/88  ? ? ? ?LABS: ? ?Office Visit on 05/31/2021  ?Component Date Value Ref Range Status  ? Hemoglobin A1C 05/31/2021 5.7 (A)  4.0 - 5.6 % Final  ? ? ?Physical Examination: ? ?BP 128/84 (Cuff Size: Large)   Pulse 90   Ht 6' (1.829 m)   Wt 290 lb 3.2 oz (131.6 kg)   SpO2 98%   BMI 39.36 kg/m?  ? ? ? ?ASSESSMENT/PLAN: ? ?Diabetes type 2 with obesity ? ?See history of present illness for detailed discussion of his current management, blood sugar patterns and problems identified ? ?A1c is again lower than expected at 5.7 % ?His A1c is likely falsely low ? ?Continues to be on 14 mg Rybelsus and Synjardy 12.06/998 2 tablets daily ?Although he has not lost any weight he still has been able to try and keep up with  his lifestyle changes including exercise ? ?He is not usually checking postprandial readings but likely control is adequate since his A1c is consistent ? ?  HYPERTENSION: Blood pressure is fairly good today

## 2021-06-01 LAB — BASIC METABOLIC PANEL
BUN: 13 mg/dL (ref 6–23)
CO2: 27 mEq/L (ref 19–32)
Calcium: 9.6 mg/dL (ref 8.4–10.5)
Chloride: 105 mEq/L (ref 96–112)
Creatinine, Ser: 0.79 mg/dL (ref 0.40–1.50)
GFR: 104.71 mL/min (ref >60.00)
Glucose, Bld: 81 mg/dL (ref 70–99)
Potassium: 4 mEq/L (ref 3.5–5.1)
Sodium: 141 mEq/L (ref 135–145)

## 2021-06-01 LAB — MICROALBUMIN / CREATININE URINE RATIO
Creatinine,U: 80.4 mg/dL
Microalb Creat Ratio: 0.9 mg/g (ref 0.0–30.0)
Microalb, Ur: <0.7 mg/dL (ref 0.0–1.9)

## 2021-06-06 ENCOUNTER — Other Ambulatory Visit (HOSPITAL_COMMUNITY): Payer: Self-pay

## 2021-06-06 ENCOUNTER — Telehealth: Payer: Self-pay | Admitting: Pharmacy Technician

## 2021-06-06 NOTE — Telephone Encounter (Signed)
Patient Advocate Encounter ?  ?Received notification from [facility] that prior authorization renewal for Rybelsus 14mg  tablets is required. ?  ?PA submitted on 06/06/2021 ?Key  [id number] ?Status is pending ?   ? ? ? ?Lady Deutscher, CPhT-Adv ?Pharmacy Patient Advocate Specialist ?[place] Patient Advocate Team ?Direct Number: [phone]  Fax: [phone] ? ?

## 2021-06-06 NOTE — Telephone Encounter (Signed)
Patient Advocate Encounter ? ?Prior Authorization for Rybelsus 14mg  tablets has been approved.   ? ?PA# [id number] ?Effective dates: 06/06/2021 through 06/07/2022 ? ?Lady Deutscher, CPhT-Adv ?Pharmacy Patient Advocate Specialist ?[place] Patient Advocate Team ?Direct Number: [phone]  Fax: [phone] ? ?

## 2021-06-13 ENCOUNTER — Other Ambulatory Visit: Payer: Self-pay | Admitting: Endocrinology

## 2021-06-13 DIAGNOSIS — E669 Obesity, unspecified: Secondary | ICD-10-CM

## 2021-07-03 ENCOUNTER — Other Ambulatory Visit: Payer: Self-pay

## 2021-07-03 DIAGNOSIS — E118 Type 2 diabetes mellitus with unspecified complications: Secondary | ICD-10-CM

## 2021-07-03 MED ORDER — RYBELSUS 14 MG PO TABS: ORAL_TABLET | ORAL | 2 refills | Status: DC

## 2021-07-04 ENCOUNTER — Other Ambulatory Visit: Payer: Self-pay

## 2021-07-04 ENCOUNTER — Other Ambulatory Visit: Payer: Self-pay | Admitting: Internal Medicine

## 2021-07-04 DIAGNOSIS — E785 Hyperlipidemia, unspecified: Secondary | ICD-10-CM

## 2021-07-04 MED ORDER — AMLODIPINE BESYLATE 5 MG PO TABS: ORAL_TABLET | ORAL | 3 refills | Status: DC

## 2021-07-05 ENCOUNTER — Other Ambulatory Visit: Payer: Self-pay | Admitting: Internal Medicine

## 2021-07-05 DIAGNOSIS — I10 Essential (primary) hypertension: Secondary | ICD-10-CM

## 2021-07-05 DIAGNOSIS — E139 Other specified diabetes mellitus without complications: Secondary | ICD-10-CM

## 2021-09-20 ENCOUNTER — Other Ambulatory Visit: Payer: Self-pay | Admitting: Endocrinology

## 2021-09-20 DIAGNOSIS — E1169 Type 2 diabetes mellitus with other specified complication: Secondary | ICD-10-CM

## 2021-09-30 NOTE — Progress Notes (Deleted)
e

## 2021-10-01 ENCOUNTER — Ambulatory Visit: Payer: No Typology Code available for payment source | Admitting: Endocrinology

## 2021-10-01 ENCOUNTER — Ambulatory Visit: Payer: No Typology Code available for payment source | Admitting: Internal Medicine

## 2021-10-09 ENCOUNTER — Encounter: Payer: Self-pay | Admitting: Internal Medicine

## 2021-10-09 ENCOUNTER — Ambulatory Visit (INDEPENDENT_AMBULATORY_CARE_PROVIDER_SITE_OTHER): Payer: No Typology Code available for payment source | Admitting: Internal Medicine

## 2021-10-09 VITALS — BP 126/76 | HR 89 | Temp 98.9°F | Resp 16 | Wt 294.0 lb

## 2021-10-09 DIAGNOSIS — H608X3 Other otitis externa, bilateral: Secondary | ICD-10-CM

## 2021-10-09 DIAGNOSIS — I10 Essential (primary) hypertension: Secondary | ICD-10-CM

## 2021-10-09 MED ORDER — NEOMYCIN-POLYMYXIN-HC 3.5-10000-1 OT SUSP: 3.0000 [drp] | Freq: Three times a day (TID) | OTIC | 2 refills | Status: DC

## 2021-10-09 NOTE — Patient Instructions (Signed)
Otitis Externa  Otitis externa is an infection of the outer ear canal. The outer ear canal is the area between the outside of the ear and the eardrum. Otitis externa is sometimes called swimmer's ear. What are the causes? Common causes of this condition include: Swimming in dirty water. Moisture in the ear. An injury to the inside of the ear. An object stuck in the ear. A cut or scrape on the outside of the ear or in the ear canal. What increases the risk? You are more likely to develop this condition if you go swimming often. What are the signs or symptoms? The first symptom of this condition is often itching in the ear. Later symptoms of the condition include: Swelling of the ear. Redness in the ear. Ear pain. The pain may get worse when you pull on your ear. Pus coming from the ear. How is this diagnosed? This condition may be diagnosed by examining the ear and testing fluid from the ear for bacteria and funguses. How is this treated? This condition may be treated with: Antibiotic ear drops. These are often given for 10-14 days. Medicines to reduce itching and swelling. Follow these instructions at home: If you were prescribed antibiotic ear drops, use them as told by your health care provider. Do not stop using the antibiotic even if you start to feel better. Take over-the-counter and prescription medicines only as told by your health care provider. Avoid getting water in your ears as told by your health care provider. This may include avoiding swimming or water sports for a few days. Keep all follow-up visits. This is important. How is this prevented? Keep your ears dry. Use the corner of a towel to dry your ears after you swim or bathe. Avoid scratching or putting things in your ear. Doing these things can damage the ear canal or remove the protective wax that lines it, which makes it easier for bacteria and funguses to grow. Avoid swimming in lakes, polluted water, or swimming  pools that may not have enough chlorine. Contact a health care provider if: You have a fever. Your ear is still red, swollen, painful, or draining pus after 3 days. Your redness, swelling, or pain gets worse. You have a severe headache. Get help right away if: You have redness, swelling, and pain or tenderness in the area behind your ear. Summary Otitis externa is an infection of the outer ear canal. Common causes include swimming in dirty water, moisture in the ear, or a cut or scrape in the ear. Symptoms include pain, redness, and swelling of the ear canal. If you were prescribed antibiotic ear drops, use them as told by your health care provider. Do not stop using the antibiotic even if you start to feel better. This information is not intended to replace advice given to you by your health care provider. Make sure you discuss any questions you have with your health care provider. Document Revised: 04/05/2020 Document Reviewed: 04/05/2020 Elsevier Patient Education  2023 [employer]  

## 2021-10-09 NOTE — Progress Notes (Signed)
Subjective:  Patient ID: Tyler Benton, male    DOB: [birth year]  Age: 51 y.o. MRN: [id number]  CC: Hypertension   HPI Tyler Benton presents for f/up -   He complains of a several week history of itchy/flaky sensation in both ears.  Outpatient Medications Prior to Visit  Medication Sig Dispense Refill   amLODipine (NORVASC) 5 MG tablet TAKE 1 TABLET(5 MG) BY MOUTH DAILY 30 tablet 3   Blood Glucose Monitoring Suppl (ONETOUCH VERIO) w/Device KIT Use to check blood sugar once a day 1 kit 0   glucose blood (ONETOUCH VERIO) test strip USE TO TEST BLOOD SUGAR ONCE DAILY AS DIRECTED 100 strip 2   Insulin Lispro Prot & Lispro (HUMALOG 75/25 MIX) (75-25) 100 UNIT/ML Kwikpen INJECT 30 UNITS INTO THE SKIN DAILY BEFORE SUPPER, INJECT 32 UNITS UNDER THE SKIN THREE TIMES DAILY BEFORE MEALS IF BLOOD SUGAR IS OVER 130 30 mL 1   Insulin Pen Needle 31G X 5 MM MISC Use on insulin pen 2x daily 100 each 1   irbesartan (AVAPRO) 150 MG tablet TAKE 1 TABLET(150 MG) BY MOUTH DAILY 90 tablet 0   nabumetone (RELAFEN) 750 MG tablet TAKE 1 TABLET(750 MG) BY MOUTH TWICE DAILY WITH FOOD AS NEEDED FOR MILD PAIN OR MODERATE PAIN 60 tablet 3   OneTouch Delica Lancets 03E MISC Use to check blood sugar once a day 100 each 2   rosuvastatin (CRESTOR) 20 MG tablet TAKE 1 TABLET(20 MG) BY MOUTH DAILY 90 tablet 0   SYNJARDY XR 12.06-998 MG TB24 TAKE 2 TABLETS BY MOUTH DAILY WITH BREAKFAST 60 tablet 2   ezetimibe (ZETIA) 10 MG tablet Take 1 tablet (10 mg total) by mouth daily. 30 tablet 3   Glucagon (GVOKE HYPOPEN 2-PACK) 1 MG/0.2ML SOAJ Inject 1 Act into the skin daily as needed. 2 mL 5   Semaglutide (RYBELSUS) 14 MG TABS TAKE 1 TABLET BY MOUTH EVERY DAY 30 tablet 2   sildenafil (REVATIO) 20 MG tablet Take 4 tablets (80 mg total) by mouth daily as needed. 60 tablet 5   No facility-administered medications prior to visit.    ROS Review of Systems  Constitutional: Negative.  Negative for chills, diaphoresis, fatigue and  fever.  HENT: Negative.  Negative for ear discharge, ear pain and sinus pressure.   Eyes: Negative.   Respiratory:  Negative for cough, chest tightness and wheezing.   Cardiovascular:  Negative for chest pain, palpitations and leg swelling.  Gastrointestinal:  Negative for abdominal pain, constipation, diarrhea and nausea.  Endocrine: Negative.   Genitourinary: Negative.  Negative for difficulty urinating.  Musculoskeletal: Negative.   Skin: Negative.   Neurological:  Negative for dizziness and weakness.  Hematological:  Negative for adenopathy. Does not bruise/bleed easily.  Psychiatric/Behavioral: Negative.      Objective:  BP 126/76 (BP Location: Left Arm, Patient Position: Sitting)   Pulse 89   Temp 98.9 F (37.2 C) (Oral)   Resp 16   Wt 294 lb (133.4 kg)   BMI 39.87 kg/m   BP Readings from Last 3 Encounters:  10/09/21 126/76  05/31/21 138/88  05/31/21 128/84    Wt Readings from Last 3 Encounters:  10/09/21 294 lb (133.4 kg)  05/31/21 290 lb (131.5 kg)  05/31/21 290 lb 3.2 oz (131.6 kg)    Physical Exam Vitals reviewed.  HENT:     Right Ear: Hearing, tympanic membrane and external ear normal. There is no impacted cerumen. No foreign body.     Left Ear:  Hearing, tympanic membrane and external ear normal. There is no impacted cerumen. No foreign body.     Ears:     Comments: +++ flaking/scaling in both EAC's    Nose: Nose normal.     Mouth/Throat:     Mouth: Mucous membranes are moist.  Eyes:     General: No scleral icterus.    Conjunctiva/sclera: Conjunctivae normal.  Cardiovascular:     Rate and Rhythm: Normal rate and regular rhythm.     Heart sounds: No murmur heard. Pulmonary:     Effort: Pulmonary effort is normal.     Breath sounds: No stridor. No wheezing, rhonchi or rales.  Abdominal:     General: Abdomen is flat.     Palpations: There is no mass.     Tenderness: There is no abdominal tenderness. There is no guarding.     Hernia: No hernia is  present.  Musculoskeletal:        General: Normal range of motion.     Cervical back: Neck supple.     Right lower leg: No edema.     Left lower leg: No edema.  Lymphadenopathy:     Cervical: No cervical adenopathy.  Skin:    General: Skin is warm and dry.     Findings: No rash.  Neurological:     General: No focal deficit present.     Mental Status: He is alert.     Lab Results  Component Value Date   WBC 7.2 07/27/2020   HGB 16.8 07/27/2020   HCT 49.9 07/27/2020   PLT 268.0 07/27/2020   GLUCOSE 81 05/31/2021   CHOL 117 01/25/2021   TRIG 60.0 01/25/2021   HDL 44.40 01/25/2021   LDLDIRECT 141.3 01/12/2014   LDLCALC 61 01/25/2021   ALT 33 08/29/2020   AST 23 08/29/2020   NA 141 05/31/2021   K 4.0 05/31/2021   CL 105 05/31/2021   CREATININE 0.79 05/31/2021   BUN 13 05/31/2021   CO2 27 05/31/2021   TSH 1.37 05/20/2019   PSA 0.42 07/27/2020   HGBA1C 5.7 (A) 05/31/2021   MICROALBUR <0.7 05/31/2021    [place] LIMITED JOINT SPACE STRUCTURES UP RIGHT  Result Date: 02/22/2017 [facility] US performed of: Right This study was ordered, performed, and interpreted by Tyler Benton D.O.   Shoulder:  Supraspinatus: Articular sided tear noted with what appears to be a very small Hill-Sachs deformity  bursal bulge seen with shoulder abduction on impingement view. Subscapularis:  Appears normal on long and transverse views. Positive bursa Teres Minor:  Appears normal on long and transverse views. AC joint: Arthritic changes Glenohumeral Joint:  Appears normal without effusion. Glenoid Labrum:  Intact without visualized tears. Biceps Tendon:  Appears normal on long and transverse views, no fraying of tendon, tendon located in intertubercular groove, no subluxation with shoulder internal or external rotation.   Impression: Partial rotator cuff tear  DG Shoulder Right  Result Date: 02/12/2017 CLINICAL DATA:  Shoulder pain after injury 4 months ago. EXAM: RIGHT SHOULDER - 2+ VIEW COMPARISON:  None.  FINDINGS: Glenohumeral joint appears normal. Narrowing of the humeral acromial distance suggesting rotator cuff pathology. Greater tuberosity osteophyte formation. AC joint degenerative change. IMPRESSION: No acute or traumatic finding. Narrowing of the humeral acromial distance. Greater tuberosity osteophyte formation. AC joint osteoarthritis. Electronically Signed   By: Nelson Chimes M.D.   On: 02/12/2017 15:21   DG Lumbar Spine Complete  Result Date: 02/12/2017 CLINICAL DATA:  Chronic low back pain, worse with standing.  EXAM: LUMBAR SPINE - COMPLETE 4+ VIEW COMPARISON:  None. FINDINGS: Five lumbar type vertebral bodies show normal alignment. Mild disc space narrowing L4-5. Mild lower lumbar facet arthritis. No pars defect or slippage. Sacroiliac joints appear normal. IMPRESSION: Mild disc space narrowing L4-5.  No acute finding. Electronically Signed   By: Nelson Chimes M.D.   On: 02/12/2017 15:20    Assessment & Plan:   Kary was seen today for hypertension.  Diagnoses and all orders for this visit:  Essential hypertension- His BP is well controlled.  Chronic eczematous otitis externa of both ears -     neomycin-polymyxin-hydrocortisone (CORTISPORIN) 3.5-10000-1 OTIC suspension; Place 3 drops into both ears 3 (three) times daily.   I have discontinued Tyler Benton "Eddy"'s Gvoke HypoPen 2-Pack, sildenafil, and ezetimibe. I am also having him start on neomycin-polymyxin-hydrocortisone. Additionally, I am having him maintain his Insulin Pen Needle, Insulin Lispro Prot & Lispro, nabumetone, OneTouch Verio, OneTouch Delica Lancets 44Q, OneTouch Verio, rosuvastatin, amLODipine, irbesartan, and Synjardy XR.  Meds ordered this encounter  Medications   neomycin-polymyxin-hydrocortisone (CORTISPORIN) 3.5-10000-1 OTIC suspension    Sig: Place 3 drops into both ears 3 (three) times daily.    Dispense:  10 mL    Refill:  2     Follow-up: Return in about 3 months (around 01/08/2022).  Scarlette Calico, MD

## 2021-10-12 ENCOUNTER — Other Ambulatory Visit: Payer: Self-pay

## 2021-10-12 DIAGNOSIS — E118 Type 2 diabetes mellitus with unspecified complications: Secondary | ICD-10-CM

## 2021-10-12 MED ORDER — RYBELSUS 14 MG PO TABS: ORAL_TABLET | ORAL | 2 refills | Status: DC

## 2021-10-19 ENCOUNTER — Encounter: Payer: Self-pay | Admitting: Endocrinology

## 2021-10-19 ENCOUNTER — Ambulatory Visit (INDEPENDENT_AMBULATORY_CARE_PROVIDER_SITE_OTHER): Payer: No Typology Code available for payment source | Admitting: Endocrinology

## 2021-10-19 VITALS — BP 118/82 | HR 84 | Ht 72.0 in | Wt 294.6 lb

## 2021-10-19 DIAGNOSIS — E669 Obesity, unspecified: Secondary | ICD-10-CM

## 2021-10-19 DIAGNOSIS — E1169 Type 2 diabetes mellitus with other specified complication: Secondary | ICD-10-CM

## 2021-10-19 LAB — BASIC METABOLIC PANEL
BUN: 12 mg/dL (ref 6–23)
CO2: 29 mEq/L (ref 19–32)
Calcium: 10.1 mg/dL (ref 8.4–10.5)
Chloride: 103 mEq/L (ref 96–112)
Creatinine, Ser: 1.01 mg/dL (ref 0.40–1.50)
GFR: 87.43 mL/min (ref >60.00)
Glucose, Bld: 97 mg/dL (ref 70–99)
Potassium: 3.8 mEq/L (ref 3.5–5.1)
Sodium: 140 mEq/L (ref 135–145)

## 2021-10-19 LAB — POCT GLYCOSYLATED HEMOGLOBIN (HGB A1C): Hemoglobin A1C: 5.7 % — AB (ref 4.0–5.6)

## 2021-10-19 LAB — MICROALBUMIN / CREATININE URINE RATIO
Creatinine,U: 112.4 mg/dL
Microalb Creat Ratio: 0.6 mg/g (ref 0.0–30.0)
Microalb, Ur: 0.7 mg/dL (ref 0.0–1.9)

## 2021-10-19 MED ORDER — TIRZEPATIDE 5 MG/0.5ML ~~LOC~~ SOAJ: 5.0000 mg | SUBCUTANEOUS | 0 refills | Status: DC

## 2021-10-19 MED ORDER — TIRZEPATIDE 10 MG/0.5ML ~~LOC~~ SOAJ: 10.0000 mg | SUBCUTANEOUS | 1 refills | Status: DC

## 2021-10-19 NOTE — Progress Notes (Signed)
Patient ID: Tyler Benton, male   DOB: [birth year], 51 y.o.   MRN: [id number]            Reason for Appointment: Follow-up for Type 2 Diabetes and hypertension  Referring physician: Scarlette Calico  History of Present Illness:          Diagnosis: Type 2 diabetes mellitus, date of diagnosis: 2014       Past history: He had routine labs in 2014 which showed A1c of 6.6 and glucose of 158 fasting This was felt to be borderline and he was not started on any medication He was given Belviq for weight loss but he did not continue this after 2 weeks since he did not help and he found it expensive He was referred for evaluation because of poor blood sugar control in 8/15 and his blood sugar was significantly high with fasting glucose of 266 and A1c of 9.2 This was despite his trying to lose weight and he had lost 30 pounds along with exercising regularly His PCP had started treatment of his diabetes with a combination treatment using Victoza and Xigduo He was having poor control of his diabetes with A1c 9.2 in 2/17 because of noncompliance with on measures of self-care and medications and having symptomatic hyperglycemia  A1c done in 12/19 had gone up to 13%  Recent history:    Non-insulin hypoglycemic drugs : Rybelsus 14 mg daily, Synjardy XR 12.06/998, 2 tablets daily  His A1c is consistently in the normal range and now the same at 5.7  A1c lower than expected for actual blood sugars   Current management, blood sugar patterns and problems identified:  He says that his blood sugars have been periodically higher but mostly because of eating sweets or sweets. He also has had more frequency of urination He says that whenever his blood sugars are over 130 he takes 30 units of insulin However he has gained 4 pounds in weight Has not gone back to exercise as yet which she had been previously doing He takes his Rybelsus consistently 30 minutes before eating in the morning Checking blood sugars  more frequently at different times and an average blood sugar at any given time of the day is under 140 except at bedtime when it is slightly higher No hypoglycemia He is reluctant to use continuous glucose monitoring despite discussing advantages  Side effects from medications have been:  none  His dinnertime is variable in the evenings, usually first meal of the day is about 12 noon  Glucose monitoring:  Less than once a day on an average      Glucometer: One touch Verio   Blood Glucose readings from meter review:    PRE-MEAL Mornings Lunch Dinner Bedtime Overall  Glucose range: 104-195   98-191 98-195  Mean/median: 135   145 129   POST-MEAL PC Breakfast PC Lunch PC Dinner  Glucose range:   102-140  Mean/median:   116   prior  PRE-MEAL Fasting Lunch Dinner Bedtime Overall  Glucose range: 102-138      Mean/median:        POST-MEAL PC Breakfast PC Lunch PC Dinner  Glucose range:     Mean/median:        Self-care: He has been consistent with diet usually   Meals: 3 meals per day. Breakfast is protein shake or oatmeal, lunch is frequently salad or sandwich. Dinner usually sandwich         Dietician visit, most recent: 10/15  Weight history: was 380 in 2014  Wt Readings from Last 3 Encounters:  10/19/21 294 lb 9.6 oz (133.6 kg)  10/09/21 294 lb (133.4 kg)  05/31/21 290 lb (131.5 kg)    Glycemic control:   Lab Results  Component Value Date   HGBA1C 5.7 (A) 10/19/2021   HGBA1C 5.7 (A) 05/31/2021   HGBA1C 5.9 (A) 01/25/2021   Lab Results  Component Value Date   MICROALBUR 0.7 10/19/2021   LDLCALC 61 01/25/2021   CREATININE 1.01 10/19/2021     Other active problems: See review of systems   Allergies as of 10/19/2021   No Known Allergies      Medication List        Accurate as of October 19, 2021  9:04 PM. If you have any questions, ask your nurse or doctor.          STOP taking these medications    Rybelsus 14 MG Tabs Generic  drug: Semaglutide Stopped by: Elayne Snare, MD       TAKE these medications    amLODipine 5 MG tablet Commonly known as: NORVASC TAKE 1 TABLET(5 MG) BY MOUTH DAILY   Insulin Lispro Prot & Lispro (75-25) 100 UNIT/ML Kwikpen Commonly known as: HUMALOG 75/25 MIX INJECT 30 UNITS INTO THE SKIN DAILY BEFORE SUPPER, INJECT 32 UNITS UNDER THE SKIN THREE TIMES DAILY BEFORE MEALS IF BLOOD SUGAR IS OVER 130   Insulin Pen Needle 31G X 5 MM Misc Use on insulin pen 2x daily   irbesartan 150 MG tablet Commonly known as: AVAPRO TAKE 1 TABLET(150 MG) BY MOUTH DAILY   nabumetone 750 MG tablet Commonly known as: RELAFEN TAKE 1 TABLET(750 MG) BY MOUTH TWICE DAILY WITH FOOD AS NEEDED FOR MILD PAIN OR MODERATE PAIN   neomycin-polymyxin-hydrocortisone 3.5-10000-1 OTIC suspension Commonly known as: CORTISPORIN Place 3 drops into both ears 3 (three) times daily.   OneTouch Delica Lancets 50D Misc Use to check blood sugar once a day   OneTouch Verio test strip Generic drug: glucose blood USE TO TEST BLOOD SUGAR ONCE DAILY AS DIRECTED   OneTouch Verio w/Device Kit Use to check blood sugar once a day   rosuvastatin 20 MG tablet Commonly known as: CRESTOR TAKE 1 TABLET(20 MG) BY MOUTH DAILY   Synjardy XR 12.06-998 MG Tb24 Generic drug: Empagliflozin-metFORMIN HCl ER TAKE 2 TABLETS BY MOUTH DAILY WITH BREAKFAST   tirzepatide 5 MG/0.5ML Pen Commonly known as: MOUNJARO Inject 5 mg into the skin once a week. Started by: Elayne Snare, MD   tirzepatide 10 MG/0.5ML Pen Commonly known as: MOUNJARO Inject 10 mg into the skin once a week. Started by: Elayne Snare, MD        Allergies: No Known Allergies  Past Medical History:  Diagnosis Date   Diabetes mellitus without complication (HCC)    GERD (gastroesophageal reflux disease)    Obesity     Past Surgical History:  Procedure Laterality Date   GROWTH PLATE SURGERY Bilateral 1980s   WISDOM TOOTH EXTRACTION      Family History   Problem Relation Age of Onset   Heart disease Mother    Hypertension Mother    Early death Neg Hx    Alcohol abuse Neg Hx    Birth defects Neg Hx    Cancer Neg Hx    COPD Neg Hx    Drug abuse Neg Hx    Hyperlipidemia Neg Hx    Kidney disease Neg Hx    Stroke Neg Hx  Thyroid disease Neg Hx    Diabetes Maternal Grandfather     Social History:  reports that he has never smoked. He has never used smokeless tobacco. He reports that he does not drink alcohol and does not use drugs.    Review of Systems         Lipids: He has been prescribed Crestor 20 mg daily by his PCP, labs as follows      Lab Results  Component Value Date   CHOL 117 01/25/2021   HDL 44.40 01/25/2021   LDLCALC 61 01/25/2021   LDLDIRECT 141.3 01/12/2014   TRIG 60.0 01/25/2021   CHOLHDL 3 01/25/2021                  Thyroid: He was found to have left-sided thyroid nodule on initial exam and this was benign on biopsy On exam he has had a 2.5-3 cm nodule on the left side  Lab Results  Component Value Date   TSH 1.37 05/20/2019    HYPERTENSION: He is still on amlodipine 5 mg and Avapro 150 mg daily Previously blood pressure was high because of not tolerating HCTZ which caused excessive urination Indapamide caused blood pressure to be as low as 90  He has been checking blood pressure at work periodically    BP Readings from Last 3 Encounters:  10/19/21 118/82  10/09/21 126/76  05/31/21 138/88     LABS:  Office Visit on 10/19/2021  Component Date Value Ref Range Status   Hemoglobin A1C 10/19/2021 5.7 (A)  4.0 - 5.6 % Final   Sodium 10/19/2021 140  135 - 145 mEq/L Final   Potassium 10/19/2021 3.8  3.5 - 5.1 mEq/L Final   Chloride 10/19/2021 103  96 - 112 mEq/L Final   CO2 10/19/2021 29  19 - 32 mEq/L Final   Glucose, Bld 10/19/2021 97  70 - 99 mg/dL Final   BUN 10/19/2021 12  6 - 23 mg/dL Final   Creatinine, Ser 10/19/2021 1.01  0.40 - 1.50 mg/dL Final   GFR 10/19/2021 87.43  >60.00  mL/min Final   Calculated using the CKD-EPI Creatinine Equation (2021)   Calcium 10/19/2021 10.1  8.4 - 10.5 mg/dL Final   Microalb, Ur 10/19/2021 0.7  0.0 - 1.9 mg/dL Final   Creatinine,U 10/19/2021 112.4  mg/dL Final   Microalb Creat Ratio 10/19/2021 0.6  0.0 - 30.0 mg/g Final    Physical Examination:  BP 118/82   Pulse 84   Ht 6' (1.829 m)   Wt 294 lb 9.6 oz (133.6 kg)   SpO2 96%   BMI 39.95 kg/m     ASSESSMENT/PLAN:  Diabetes type 2 with obesity  See history of present illness for detailed discussion of his current management, blood sugar patterns and problems identified  A1c is again lower than expected at 5.7 % His blood sugars at home averaging about 129 but not always checking readings after meals  His main difficulty is not being able to lose weight Also he thinks that he has some craving for sweets Has not been consistent with exercise recently  Recommendations: Since he does not want to use CGM he will need to check his readings after meals more consistently Check chemistry panel and microalbumin today Trial of MOUNJARO instead of Rybelsus which will be more effective with blood sugar control as well as improved satiety and weight loss Discussed with the patient the action of GIP/GLP-1 drugs, the effects on pancreatic and liver function, effects on brain  and stomach with improved satiety, slowing gastric emptying, improving satiety and reducing liver glucose output.  Discussed the effects on promoting weight loss. Explained possible side effects of MOUNJARO, most commonly nausea that usually improves over time; discussed safety information in package insert.  Demonstrated the medication injection device and injection technique to the patient.  Since he is switching from Rybelsus he can start 5 mg dosage weekly for first 4 injections and then increase the dose to 10 mg  He will stop Rybelsus but continue Synjardy Patient brochure on Mounjaro and explained use of the  available co-pay card  Need to start back on his exercise program    HYPERTENSION: Blood pressure is good today, he will continue to monitor regularly at work also  Needs urine microalbumin checked  Advised him to follow-up with his PCP for lipids  Follow-up in 4 months   Patient Instructions  Check blood sugars on waking up 3-4 days a week  Also check blood sugars about 2 hours after meals and do this after different meals by rotation  Recommended blood sugar levels on waking up are 90-130 and about 2 hours after meal is 130-160  Please bring your blood sugar monitor to each visit, thank you    Elayne Snare 10/19/2021, 9:04 PM   Note: This office note was prepared with Dragon voice recognition system technology. Any transcriptional errors that result from this process are unintentional.

## 2021-10-19 NOTE — Patient Instructions (Signed)
Check blood sugars on waking up 3-4 days a week  Also check blood sugars about 2 hours after meals and do this after different meals by rotation  Recommended blood sugar levels on waking up are 90-130 and about 2 hours after meal is 130-160  Please bring your blood sugar monitor to each visit, thank you   

## 2021-11-02 ENCOUNTER — Other Ambulatory Visit: Payer: Self-pay | Admitting: Internal Medicine

## 2021-11-02 DIAGNOSIS — I10 Essential (primary) hypertension: Secondary | ICD-10-CM

## 2021-11-02 DIAGNOSIS — E139 Other specified diabetes mellitus without complications: Secondary | ICD-10-CM

## 2021-11-12 ENCOUNTER — Other Ambulatory Visit: Payer: Self-pay

## 2021-11-12 DIAGNOSIS — E1169 Type 2 diabetes mellitus with other specified complication: Secondary | ICD-10-CM

## 2021-11-12 MED ORDER — SYNJARDY XR 12.5-1000 MG PO TB24: 2.0000 | ORAL_TABLET | Freq: Every day | ORAL | 2 refills | Status: DC

## 2021-11-12 MED ORDER — ONETOUCH VERIO W/DEVICE KIT: PACK | 0 refills | Status: AC

## 2021-11-18 ENCOUNTER — Other Ambulatory Visit: Payer: Self-pay | Admitting: Endocrinology

## 2021-11-27 ENCOUNTER — Ambulatory Visit
Admission: EM | Admit: 2021-11-27 | Discharge: 2021-11-27 | Disposition: A | Discharge status: Home / Self Care | Payer: No Typology Code available for payment source | Attending: Family Medicine | Admitting: Family Medicine

## 2021-11-27 DIAGNOSIS — R3129 Other microscopic hematuria: Secondary | ICD-10-CM | POA: Insufficient documentation

## 2021-11-27 DIAGNOSIS — R8290 Unspecified abnormal findings in urine: Secondary | ICD-10-CM | POA: Diagnosis not present

## 2021-11-27 DIAGNOSIS — R350 Frequency of micturition: Secondary | ICD-10-CM | POA: Insufficient documentation

## 2021-11-27 DIAGNOSIS — R109 Unspecified abdominal pain: Secondary | ICD-10-CM | POA: Diagnosis not present

## 2021-11-27 LAB — POCT URINALYSIS DIP (MANUAL ENTRY)
Bilirubin, UA: NEGATIVE
Glucose, UA: >=1000 mg/dL — AB
Ketones, POC UA: NEGATIVE mg/dL
Leukocytes, UA: NEGATIVE
Nitrite, UA: NEGATIVE
Protein Ur, POC: NEGATIVE mg/dL
Spec Grav, UA: 1.015 (ref 1.010–1.025)
Urobilinogen, UA: 0.2 E.U./dL
pH, UA: 5 (ref 5.0–8.0)

## 2021-11-27 LAB — POCT FASTING CBG KUC MANUAL ENTRY: POCT Glucose (KUC): 125 mg/dL — AB (ref 70–99)

## 2021-11-27 MED ORDER — CIPROFLOXACIN HCL 500 MG PO TABS: 500.0000 mg | ORAL_TABLET | Freq: Two times a day (BID) | ORAL | 0 refills | Status: DC

## 2021-11-27 MED ORDER — TAMSULOSIN HCL 0.4 MG PO CAPS: 0.4000 mg | ORAL_CAPSULE | Freq: Every day | ORAL | 1 refills | Status: DC

## 2021-11-27 NOTE — Discharge Instructions (Signed)
Your urine has been sent for culture.  The report will be available on MyChart in 2 to 3 days Take your antibiotic twice a day.  You may stop the antibiotic early if your culture report is negative Make sure you are drinking plenty of water Take Flomax daily until pain resolves May take ibuprofen as needed for pain Follow-up with your doctor if not improving later in the week

## 2021-11-27 NOTE — ED Triage Notes (Addendum)
Pt c/o intermittent bilateral flank pain x 2 days. Also having urinary frequency.

## 2021-11-27 NOTE — ED Provider Notes (Signed)
[facility]    CSN: [id number] Arrival date & time: 11/27/21  1329      History   Chief Complaint Chief Complaint  Patient presents with   Flank Pain    bilateral   Urinary Frequency    HPI Tyler Benton is a 52 y.o. male.   HPI  Patient has a 2-day history of urinary frequency and flank pain.  He states that he will have flank pain and then its resolved and when he urinates.  He thinks he may have something wrong with his kidneys.  No hematuria.  No fever or chills.  No nausea or vomiting.  No history of lifting or injury.  No history of kidney stones or kidney infections.  Patient is diabetic.  Generally sugars have been well controlled.  Past Medical History:  Diagnosis Date   Diabetes mellitus without complication ([place])    GERD (gastroesophageal reflux disease)    Obesity     Patient Active Problem List   Diagnosis Date Noted   Primary osteoarthritis of right knee 05/31/2021   Erectile dysfunction due to arterial insufficiency 01/03/2020   Primary osteoarthritis of both knees 05/20/2019   Chronic eczematous otitis externa of both ears 05/12/2018   Uncontrolled type 2 diabetes mellitus with hyperglycemia, with long-term current use of insulin ([place]) 05/11/2018   Essential hypertension 01/08/2017   DDD (degenerative disc disease), lumbar 01/08/2017   Hyperlipidemia LDL goal <100 01/08/2017   Type II diabetes mellitus with manifestations ([place]) 09/24/2013   Plantar fasciitis, bilateral 09/24/2013   Snoring 09/24/2013   Routine general medical examination at a health care facility 05/29/2012   Severe obesity (BMI >= 40) ([place]) 05/29/2012    Past Surgical History:  Procedure Laterality Date   GROWTH PLATE SURGERY Bilateral 1980s   WISDOM TOOTH EXTRACTION         Home Medications    Prior to Admission medications   Medication Sig Start Date End Date Taking? Authorizing Provider  ciprofloxacin (CIPRO) 500 MG tablet Take 1 tablet (500 mg total) by  mouth 2 (two) times daily. 11/27/21  Yes Raylene Everts, MD  tamsulosin (FLOMAX) 0.4 MG CAPS capsule Take 1 capsule (0.4 mg total) by mouth daily after supper. 11/27/21  Yes Raylene Everts, MD  amLODipine (NORVASC) 5 MG tablet TAKE 1 TABLET(5 MG) BY MOUTH DAILY 11/19/21   Elayne Snare, MD  Blood Glucose Monitoring Suppl [facility] VERIO) w/Device KIT Use to check blood sugar once a day 11/12/21   Elayne Snare, MD  Empagliflozin-metFORMIN HCl ER (SYNJARDY XR) 12.06-998 MG TB24 Take 2 tablets by mouth daily with breakfast. 11/12/21   Elayne Snare, MD  glucose blood (ONETOUCH VERIO) test strip USE TO TEST BLOOD SUGAR ONCE DAILY AS DIRECTED 05/31/21   Elayne Snare, MD  Insulin Lispro Prot & Lispro (HUMALOG 75/25 MIX) (75-25) 100 UNIT/ML Kwikpen INJECT 30 UNITS INTO THE SKIN DAILY BEFORE SUPPER, INJECT 32 UNITS UNDER THE SKIN THREE TIMES DAILY BEFORE MEALS IF BLOOD SUGAR IS OVER 130 08/04/19   Elayne Snare, MD  Insulin Pen Needle 31G X 5 MM MISC Use on insulin pen 2x daily 01/16/18   Elayne Snare, MD  irbesartan (AVAPRO) 150 MG tablet TAKE 1 TABLET(150 MG) BY MOUTH DAILY 11/02/21   Janith Lima, MD  nabumetone (RELAFEN) 750 MG tablet TAKE 1 TABLET(750 MG) BY MOUTH TWICE DAILY WITH FOOD AS NEEDED FOR MILD PAIN OR MODERATE PAIN Patient not taking: Reported on 10/19/2021 07/28/20   Suzan Slick, NP  neomycin-polymyxin-hydrocortisone (CORTISPORIN) 3.5-10000-1 OTIC suspension Place 3 drops into both ears 3 (three) times daily. 10/09/21   Janith Lima, MD  OneTouch Delica Lancets 43E MISC Use to check blood sugar once a day 05/31/21   Elayne Snare, MD  rosuvastatin (CRESTOR) 20 MG tablet TAKE 1 TABLET(20 MG) BY MOUTH DAILY 07/04/21   Janith Lima, MD  tirzepatide [facility]) 10 MG/0.5ML Pen Inject 10 mg into the skin once a week. 10/19/21   Elayne Snare, MD  tirzepatide [facility]) 5 MG/0.5ML Pen Inject 5 mg into the skin once a week. 10/19/21   Elayne Snare, MD    Family History Family History  Problem Relation  Age of Onset   Heart disease Mother    Hypertension Mother    Early death Neg Hx    Alcohol abuse Neg Hx    Birth defects Neg Hx    Cancer Neg Hx    COPD Neg Hx    Drug abuse Neg Hx    Hyperlipidemia Neg Hx    Kidney disease Neg Hx    Stroke Neg Hx    Thyroid disease Neg Hx    Diabetes Maternal Grandfather     Social History Social History   Tobacco Use   Smoking status: Never   Smokeless tobacco: Never  Substance Use Topics   Alcohol use: No   Drug use: No     Allergies   Patient has no known allergies.   Review of Systems Review of Systems See HPI  Physical Exam Triage Vital Signs ED Triage Vitals  Enc Vitals Group     BP 11/27/21 1335 112/77     Pulse Rate 11/27/21 1335 80     Resp 11/27/21 1335 17     Temp 11/27/21 1335 98.9 F (37.2 C)     Temp Source 11/27/21 1335 Oral     SpO2 11/27/21 1335 96 %     Weight 11/27/21 1337 291 lb 4.8 oz (132.1 kg)     Height --      Head Circumference --      Peak Flow --      Pain Score 11/27/21 1337 0     Pain Loc --      Pain Edu? --      Excl. in [place]? --    No data found.  Updated Vital Signs BP 112/77 (BP Location: Right Arm)   Pulse 80   Temp 98.9 F (37.2 C) (Oral)   Resp 17   Wt 132.1 kg   SpO2 96%   BMI 39.51 kg/m       Physical Exam Constitutional:      General: He is not in acute distress.    Appearance: He is well-developed. He is obese. He is not ill-appearing.  HENT:     Head: Normocephalic and atraumatic.  Eyes:     Conjunctiva/sclera: Conjunctivae normal.     Pupils: Pupils are equal, round, and reactive to light.  Cardiovascular:     Rate and Rhythm: Normal rate.  Pulmonary:     Effort: Pulmonary effort is normal. No respiratory distress.  Abdominal:     General: There is no distension.     Palpations: Abdomen is soft.     Tenderness: There is abdominal tenderness. There is no right CVA tenderness or left CVA tenderness.     Comments: Mild tenderness to deep palpation over  each mid abdomen/flank area.  No organomegaly.  No mass  Musculoskeletal:  General: Normal range of motion.     Cervical back: Normal range of motion.     Comments: No tenderness to palpation of lumbar spine or spinous processes  Skin:    General: Skin is warm and dry.  Neurological:     General: No focal deficit present.     Mental Status: He is alert.     Gait: Gait normal.     Deep Tendon Reflexes: Reflexes normal.      UC Treatments / Results  Labs (all labs ordered are listed, but only abnormal results are displayed) Labs Reviewed  POCT URINALYSIS DIP (MANUAL ENTRY) - Abnormal; Notable for the following components:      Result Value   Glucose, UA >=1,000 (*)    Blood, UA trace-lysed (*)    All other components within normal limits  POCT FASTING CBG [facility] MANUAL ENTRY - Abnormal; Notable for the following components:   POCT Glucose ([facility]) 125 (*)    All other components within normal limits  URINE CULTURE    EKG   Radiology No results found.  Procedures Procedures (including critical care time)  Medications Ordered in UC Medications - No data to display  Initial Impression / Assessment and Plan / UC Course  I have reviewed the triage vital signs and the nursing notes.  Pertinent labs & imaging results that were available during my care of the patient were reviewed by me and considered in my medical decision making (see chart for details).     Patient has trace hematuria.  Uncertain cause of flank pain.  He does not have acute waves of pain to suggest kidney stone.  He does not have symptoms to suggest kidney infection.  We will treat with antibiotics pending culture.  Follow-up with primary care.  Suspect musculoligamentous back pain Final Clinical Impressions(s) / UC Diagnoses   Final diagnoses:  Flank pain  Urination frequency  Microscopic hematuria     Discharge Instructions      Your urine has been sent for culture.  The report will be  available on MyChart in 2 to 3 days Take your antibiotic twice a day.  You may stop the antibiotic early if your culture report is negative Make sure you are drinking plenty of water Take Flomax daily until pain resolves May take ibuprofen as needed for pain Follow-up with your doctor if not improving later in the week   ED Prescriptions     Medication Sig Dispense Auth. Provider   tamsulosin (FLOMAX) 0.4 MG CAPS capsule Take 1 capsule (0.4 mg total) by mouth daily after supper. 10 capsule Raylene Everts, MD   ciprofloxacin (CIPRO) 500 MG tablet Take 1 tablet (500 mg total) by mouth 2 (two) times daily. 14 tablet Raylene Everts, MD      PDMP not reviewed this encounter.   Raylene Everts, MD 11/27/21 [phone]

## 2021-11-28 LAB — URINE CULTURE: Culture: NO GROWTH

## 2021-12-06 ENCOUNTER — Other Ambulatory Visit: Payer: Self-pay

## 2021-12-06 DIAGNOSIS — E1169 Type 2 diabetes mellitus with other specified complication: Secondary | ICD-10-CM

## 2021-12-06 DIAGNOSIS — I10 Essential (primary) hypertension: Secondary | ICD-10-CM

## 2021-12-06 DIAGNOSIS — E785 Hyperlipidemia, unspecified: Secondary | ICD-10-CM

## 2021-12-06 MED ORDER — ONETOUCH VERIO VI STRP: ORAL_STRIP | 2 refills | Status: DC

## 2021-12-06 MED ORDER — AMLODIPINE BESYLATE 5 MG PO TABS: ORAL_TABLET | ORAL | 3 refills | Status: DC

## 2021-12-06 MED ORDER — ROSUVASTATIN CALCIUM 20 MG PO TABS: ORAL_TABLET | ORAL | 0 refills | Status: DC

## 2022-01-04 ENCOUNTER — Other Ambulatory Visit: Payer: Self-pay

## 2022-01-04 DIAGNOSIS — E1169 Type 2 diabetes mellitus with other specified complication: Secondary | ICD-10-CM

## 2022-01-04 MED ORDER — TIRZEPATIDE 10 MG/0.5ML ~~LOC~~ SOAJ: 10.0000 mg | SUBCUTANEOUS | 1 refills | Status: DC

## 2022-01-14 ENCOUNTER — Ambulatory Visit: Payer: No Typology Code available for payment source | Admitting: Internal Medicine

## 2022-01-21 ENCOUNTER — Other Ambulatory Visit: Payer: Self-pay | Admitting: Endocrinology

## 2022-01-21 ENCOUNTER — Ambulatory Visit: Payer: No Typology Code available for payment source | Admitting: Internal Medicine

## 2022-01-21 DIAGNOSIS — E669 Obesity, unspecified: Secondary | ICD-10-CM

## 2022-01-30 ENCOUNTER — Ambulatory Visit (INDEPENDENT_AMBULATORY_CARE_PROVIDER_SITE_OTHER): Payer: No Typology Code available for payment source | Admitting: Internal Medicine

## 2022-01-30 ENCOUNTER — Ambulatory Visit (INDEPENDENT_AMBULATORY_CARE_PROVIDER_SITE_OTHER): Payer: No Typology Code available for payment source

## 2022-01-30 ENCOUNTER — Encounter: Payer: Self-pay | Admitting: Internal Medicine

## 2022-01-30 VITALS — BP 124/80 | HR 75 | Temp 99.1°F | Ht 72.0 in | Wt 287.0 lb

## 2022-01-30 DIAGNOSIS — Z1159 Encounter for screening for other viral diseases: Secondary | ICD-10-CM

## 2022-01-30 DIAGNOSIS — Z23 Encounter for immunization: Secondary | ICD-10-CM

## 2022-01-30 DIAGNOSIS — I10 Essential (primary) hypertension: Secondary | ICD-10-CM

## 2022-01-30 DIAGNOSIS — J22 Unspecified acute lower respiratory infection: Secondary | ICD-10-CM

## 2022-01-30 DIAGNOSIS — R918 Other nonspecific abnormal finding of lung field: Secondary | ICD-10-CM

## 2022-01-30 DIAGNOSIS — Z125 Encounter for screening for malignant neoplasm of prostate: Secondary | ICD-10-CM | POA: Diagnosis not present

## 2022-01-30 DIAGNOSIS — R509 Fever, unspecified: Secondary | ICD-10-CM

## 2022-01-30 DIAGNOSIS — R052 Subacute cough: Secondary | ICD-10-CM | POA: Diagnosis not present

## 2022-01-30 DIAGNOSIS — Z0000 Encounter for general adult medical examination without abnormal findings: Secondary | ICD-10-CM

## 2022-01-30 DIAGNOSIS — E139 Other specified diabetes mellitus without complications: Secondary | ICD-10-CM

## 2022-01-30 DIAGNOSIS — E785 Hyperlipidemia, unspecified: Secondary | ICD-10-CM

## 2022-01-30 LAB — LIPID PANEL
Cholesterol: 103 mg/dL (ref 0–200)
HDL: 39.4 mg/dL (ref >39.00)
LDL Cholesterol: 41 mg/dL (ref 0–99)
NonHDL: 63.94
Total CHOL/HDL Ratio: 3
Triglycerides: 113 mg/dL (ref 0.0–149.0)
VLDL: 22.6 mg/dL (ref 0.0–40.0)

## 2022-01-30 LAB — CBC WITH DIFFERENTIAL/PLATELET
Basophils Absolute: 0 10*3/uL (ref 0.0–0.1)
Basophils Relative: 0.7 % (ref 0.0–3.0)
Eosinophils Absolute: 0 10*3/uL (ref 0.0–0.7)
Eosinophils Relative: 0.5 % (ref 0.0–5.0)
HCT: 46.1 % (ref 39.0–52.0)
Hemoglobin: 15.4 g/dL (ref 13.0–17.0)
Lymphocytes Relative: 33.2 % (ref 12.0–46.0)
Lymphs Abs: 1.4 10*3/uL (ref 0.7–4.0)
MCHC: 33.5 g/dL (ref 30.0–36.0)
MCV: 97 fl (ref 78.0–100.0)
Monocytes Absolute: 0.7 10*3/uL (ref 0.1–1.0)
Monocytes Relative: 15.3 % — ABNORMAL HIGH (ref 3.0–12.0)
Neutro Abs: 2.2 10*3/uL (ref 1.4–7.7)
Neutrophils Relative %: 50.3 % (ref 43.0–77.0)
Platelets: 218 10*3/uL (ref 150.0–400.0)
RBC: 4.76 Mil/uL (ref 4.22–5.81)
RDW: 15.3 % (ref 11.5–15.5)
WBC: 4.3 10*3/uL (ref 4.0–10.5)

## 2022-01-30 LAB — TSH: TSH: 0.72 u[IU]/mL (ref 0.35–5.50)

## 2022-01-30 LAB — HEPATIC FUNCTION PANEL
ALT: 26 U/L (ref 0–53)
AST: 28 U/L (ref 0–37)
Albumin: 4.5 g/dL (ref 3.5–5.2)
Alkaline Phosphatase: 60 U/L (ref 39–117)
Bilirubin, Direct: 0.4 mg/dL — ABNORMAL HIGH (ref 0.0–0.3)
Total Bilirubin: 2 mg/dL — ABNORMAL HIGH (ref 0.2–1.2)
Total Protein: 7.5 g/dL (ref 6.0–8.3)

## 2022-01-30 LAB — PSA: PSA: 0.33 ng/mL (ref 0.10–4.00)

## 2022-01-30 MED ORDER — HYDROCOD POLI-CHLORPHE POLI ER 10-8 MG/5ML PO SUER: 5.0000 mL | Freq: Two times a day (BID) | ORAL | 0 refills | Status: DC | PRN

## 2022-01-30 MED ORDER — AMOXICILLIN-POT CLAVULANATE 875-125 MG PO TABS: 1.0000 | ORAL_TABLET | Freq: Two times a day (BID) | ORAL | 0 refills | Status: AC

## 2022-01-30 NOTE — Progress Notes (Unsigned)
Subjective:  Patient ID: Tyler Benton, male    DOB: [birth year]  Age: 51 y.o. MRN: [id number]  CC: Follow-up (May have a upper respiratory infection, fever , chills , mucus, sweating took covid test and negative started Sunday and post nasal drip ), Annual Exam, and Cough   HPI Tyler Benton presents for a CPX and f/up -  He complains of a 1 week hx of cough productive of yellow phlegm with LGF. He denies DOE, CP, SOB, wheezing, or night sweats. He is with his wife today.  Outpatient Medications Prior to Visit  Medication Sig Dispense Refill   amLODipine (NORVASC) 5 MG tablet TAKE 1 TABLET(5 MG) BY MOUTH DAILY 30 tablet 3   Blood Glucose Monitoring Suppl (ONETOUCH VERIO) w/Device KIT Use to check blood sugar once a day 1 kit 0   glucose blood (ONETOUCH VERIO) test strip USE TO TEST BLOOD SUGAR ONCE DAILY AS DIRECTED 100 strip 2   Insulin Lispro Prot & Lispro (HUMALOG 75/25 MIX) (75-25) 100 UNIT/ML Kwikpen INJECT 30 UNITS INTO THE SKIN DAILY BEFORE SUPPER, INJECT 32 UNITS UNDER THE SKIN THREE TIMES DAILY BEFORE MEALS IF BLOOD SUGAR IS OVER 130 30 mL 1   Insulin Pen Needle 31G X 5 MM MISC Use on insulin pen 2x daily 100 each 1   irbesartan (AVAPRO) 150 MG tablet TAKE 1 TABLET(150 MG) BY MOUTH DAILY 90 tablet 0   neomycin-polymyxin-hydrocortisone (CORTISPORIN) 3.5-10000-1 OTIC suspension Place 3 drops into both ears 3 (three) times daily. 10 mL 2   OneTouch Delica Lancets 37T MISC Use to check blood sugar once a day 100 each 2   rosuvastatin (CRESTOR) 20 MG tablet TAKE 1 TABLET(20 MG) BY MOUTH DAILY 90 tablet 0   SYNJARDY XR 12.06-998 MG TB24 TAKE 2 TABLETS BY MOUTH DAILY  WITH BREAKFAST 180 tablet 3   tirzepatide (MOUNJARO) 10 MG/0.5ML Pen Inject 10 mg into the skin once a week. 2 mL 1   ciprofloxacin (CIPRO) 500 MG tablet Take 1 tablet (500 mg total) by mouth 2 (two) times daily. (Patient not taking: Reported on 01/30/2022) 14 tablet 0   nabumetone (RELAFEN) 750 MG tablet TAKE 1  TABLET(750 MG) BY MOUTH TWICE DAILY WITH FOOD AS NEEDED FOR MILD PAIN OR MODERATE PAIN (Patient not taking: Reported on 10/19/2021) 60 tablet 3   tamsulosin (FLOMAX) 0.4 MG CAPS capsule Take 1 capsule (0.4 mg total) by mouth daily after supper. (Patient not taking: Reported on 01/30/2022) 10 capsule 1   No facility-administered medications prior to visit.    ROS Review of Systems  Constitutional:  Positive for chills and fever. Negative for appetite change and fatigue.  HENT:  Positive for postnasal drip, rhinorrhea and sore throat. Negative for facial swelling, sinus pressure, sinus pain and trouble swallowing.   Eyes: Negative.   Respiratory:  Positive for cough. Negative for chest tightness, shortness of breath and wheezing.   Cardiovascular: Negative.  Negative for palpitations and leg swelling.  Gastrointestinal:  Negative for abdominal pain, diarrhea, nausea and vomiting.  Genitourinary: Negative.   Musculoskeletal:  Positive for arthralgias. Negative for joint swelling and myalgias.  Skin: Negative.   Neurological: Negative.  Negative for dizziness, weakness and light-headedness.  Hematological:  Negative for adenopathy. Does not bruise/bleed easily.  Psychiatric/Behavioral: Negative.      Objective:  BP 124/80 (BP Location: Left Arm, Patient Position: Sitting, Cuff Size: Normal)   Pulse 75   Temp 99.1 F (37.3 C) (Oral)   Ht 6' (1.829 m)  Wt 287 lb (130.2 kg)   SpO2 99%   BMI 38.92 kg/m   BP Readings from Last 3 Encounters:  01/30/22 124/80  11/27/21 112/77  10/19/21 118/82    Wt Readings from Last 3 Encounters:  01/30/22 287 lb (130.2 kg)  11/27/21 291 lb 4.8 oz (132.1 kg)  10/19/21 294 lb 9.6 oz (133.6 kg)    Physical Exam Vitals reviewed.  Constitutional:      Appearance: He is not ill-appearing.  HENT:     Nose: Nose normal.     Mouth/Throat:     Mouth: Mucous membranes are moist.  Eyes:     General: No scleral icterus.    Conjunctiva/sclera:  Conjunctivae normal.  Cardiovascular:     Rate and Rhythm: Normal rate and regular rhythm.     Heart sounds: No murmur heard. Pulmonary:     Effort: Pulmonary effort is normal.     Breath sounds: No stridor. No wheezing, rhonchi or rales.  Abdominal:     General: Abdomen is protuberant. Bowel sounds are normal. There is no distension.     Palpations: Abdomen is soft. There is no hepatomegaly, splenomegaly or mass.     Tenderness: There is no abdominal tenderness.  Musculoskeletal:        General: Normal range of motion.     Cervical back: Neck supple.     Right lower leg: No edema.     Left lower leg: No edema.  Lymphadenopathy:     Cervical: No cervical adenopathy.  Skin:    General: Skin is warm and dry.  Neurological:     General: No focal deficit present.     Mental Status: He is alert.  Psychiatric:        Mood and Affect: Mood normal.        Behavior: Behavior normal.     Lab Results  Component Value Date   WBC 4.3 01/30/2022   HGB 15.4 01/30/2022   HCT 46.1 01/30/2022   PLT 218.0 01/30/2022   GLUCOSE 97 10/19/2021   CHOL 103 01/30/2022   TRIG 113.0 01/30/2022   HDL 39.40 01/30/2022   LDLDIRECT 141.3 01/12/2014   LDLCALC 41 01/30/2022   ALT 26 01/30/2022   AST 28 01/30/2022   NA 140 10/19/2021   K 3.8 10/19/2021   CL 103 10/19/2021   CREATININE 1.01 10/19/2021   BUN 12 10/19/2021   CO2 29 10/19/2021   TSH 0.72 01/30/2022   PSA 0.33 01/30/2022   HGBA1C 5.7 (A) 10/19/2021   MICROALBUR 0.7 10/19/2021    No results found.   Assessment & Plan:   Tyler Benton was seen today for follow-up, annual exam and cough.  Diagnoses and all orders for this visit:  Need for hepatitis C screening test -     Hepatitis C antibody; Future -     Hepatitis C antibody  Hyperlipidemia LDL goal <100- LDL goal achieved. Doing well on the statin  -     Lipid panel; Future -     TSH; Future -     Hepatic function panel; Future -     Hepatic function panel -     TSH -      Lipid panel  Essential hypertension- His BP is well controlled. -     CBC with Differential/Platelet; Future -     Hepatic function panel; Future -     Hepatic function panel -     CBC with Differential/Platelet  Routine general medical examination at a  health care facility- Exam completed, labs reviewed, vaccines updated, cancer screenings are UTD, pt ed material was given. -     PSA; Future -     PSA  Subacute cough- CXR is abnormal. -     DG Chest 2 View; Future -     chlorpheniramine-HYDROcodone (TUSSIONEX) 10-8 MG/5ML; Take 5 mLs by mouth every 12 (twelve) hours as needed for cough.  Fever, unspecified fever cause -     DG Chest 2 View; Future  Flu vaccine need -     Flu Vaccine QUAD 6+ mos PF IM (Fluarix Quad PF)  Diabetes 1.5, managed as type 1 ([place]) -     HM Diabetes Foot Exam -     Ambulatory referral to Ophthalmology  Opacity of lung on imaging study -     CT Chest Wo Contrast; Future  LRTI (lower respiratory tract infection)- Will treat with augmentin. -     amoxicillin-clavulanate (AUGMENTIN) 875-125 MG tablet; Take 1 tablet by mouth 2 (two) times daily for 10 days. -     chlorpheniramine-HYDROcodone (TUSSIONEX) 10-8 MG/5ML; Take 5 mLs by mouth every 12 (twelve) hours as needed for cough.   I am having Tyler L. Lovena Le "Eddy" start on amoxicillin-clavulanate and chlorpheniramine-HYDROcodone. I am also having him maintain his Insulin Pen Needle, Insulin Lispro Prot & Lispro, nabumetone, OneTouch Delica Lancets 50N, neomycin-polymyxin-hydrocortisone, irbesartan, OneTouch Verio, tamsulosin, ciprofloxacin, rosuvastatin, amLODipine, OneTouch Verio, tirzepatide, and Synjardy XR.  Meds ordered this encounter  Medications   amoxicillin-clavulanate (AUGMENTIN) 875-125 MG tablet    Sig: Take 1 tablet by mouth 2 (two) times daily for 10 days.    Dispense:  20 tablet    Refill:  0   chlorpheniramine-HYDROcodone (TUSSIONEX) 10-8 MG/5ML    Sig: Take 5 mLs by mouth every 12  (twelve) hours as needed for cough.    Dispense:  70 mL    Refill:  0   In addition to time spent on CPE, I spent 45 minutes in preparing to see the patient by review of recent labs, obtaining and reviewing separately obtained history, communicating with the patient and family or caregiver, ordering medications, tests or procedures, and documenting clinical information in the EHR including the differential Dx, treatment, and any further evaluation and management of multiple complex medical issues.     Follow-up: Return in about 6 months (around 08/01/2022).  Scarlette Calico, MD

## 2022-01-30 NOTE — Patient Instructions (Signed)
Health Maintenance, Male Adopting a healthy lifestyle and getting preventive care are important in promoting health and wellness. Ask your health care provider about: The right schedule for you to have regular tests and exams. Things you can do on your own to prevent diseases and keep yourself healthy. What should I know about diet, weight, and exercise? Eat a healthy diet  Eat a diet that includes plenty of vegetables, fruits, low-fat dairy products, and lean protein. Do not eat a lot of foods that are high in solid fats, added sugars, or sodium. Maintain a healthy weight Body mass index (BMI) is a measurement that can be used to identify possible weight problems. It estimates body fat based on height and weight. Your health care provider can help determine your BMI and help you achieve or maintain a healthy weight. Get regular exercise Get regular exercise. This is one of the most important things you can do for your health. Most adults should: Exercise for at least 150 minutes each week. The exercise should increase your heart rate and make you sweat (moderate-intensity exercise). Do strengthening exercises at least twice a week. This is in addition to the moderate-intensity exercise. Spend less time sitting. Even light physical activity can be beneficial. Watch cholesterol and blood lipids Have your blood tested for lipids and cholesterol at 49 years of age, then have this test every 5 years. You may need to have your cholesterol levels checked more often if: Your lipid or cholesterol levels are high. You are older than 49 years of age. You are at high risk for heart disease. What should I know about cancer screening? Many types of cancers can be detected early and may often be prevented. Depending on your health history and family history, you may need to have cancer screening at various ages. This may include screening for: Colorectal cancer. Prostate cancer. Skin cancer. Lung  cancer. What should I know about heart disease, diabetes, and high blood pressure? Blood pressure and heart disease High blood pressure causes heart disease and increases the risk of stroke. This is more likely to develop in people who have high blood pressure readings or are overweight. Talk with your health care provider about your target blood pressure readings. Have your blood pressure checked: Every 3-5 years if you are 18-39 years of age. Every year if you are 40 years old or older. If you are between the ages of 65 and 75 and are a current or former smoker, ask your health care provider if you should have a one-time screening for abdominal aortic aneurysm (AAA). Diabetes Have regular diabetes screenings. This checks your fasting blood sugar level. Have the screening done: Once every three years after age 45 if you are at a normal weight and have a low risk for diabetes. More often and at a younger age if you are overweight or have a high risk for diabetes. What should I know about preventing infection? Hepatitis B If you have a higher risk for hepatitis B, you should be screened for this virus. Talk with your health care provider to find out if you are at risk for hepatitis B infection. Hepatitis C Blood testing is recommended for: Everyone born from 1945 through 1965. Anyone with known risk factors for hepatitis C. Sexually transmitted infections (STIs) You should be screened each year for STIs, including gonorrhea and chlamydia, if: You are sexually active and are younger than 49 years of age. You are older than 49 years of age and your   health care provider tells you that you are at risk for this type of infection. Your sexual activity has changed since you were last screened, and you are at increased risk for chlamydia or gonorrhea. Ask your health care provider if you are at risk. Ask your health care provider about whether you are at high risk for HIV. Your health care provider  may recommend a prescription medicine to help prevent HIV infection. If you choose to take medicine to prevent HIV, you should first get tested for HIV. You should then be tested every 3 months for as long as you are taking the medicine. Follow these instructions at home: Alcohol use Do not drink alcohol if your health care provider tells you not to drink. If you drink alcohol: Limit how much you have to 0-2 drinks a day. Know how much alcohol is in your drink. In the [place] one drink equals one 12 oz bottle of beer (355 mL), one 5 oz glass of wine (148 mL), or one 1 oz glass of hard liquor (44 mL). Lifestyle Do not use any products that contain nicotine or tobacco. These products include cigarettes, chewing tobacco, and vaping devices, such as e-cigarettes. If you need help quitting, ask your health care provider. Do not use street drugs. Do not share needles. Ask your health care provider for help if you need support or information about quitting drugs. General instructions Schedule regular health, dental, and eye exams. Stay current with your vaccines. Tell your health care provider if: You often feel depressed. You have ever been abused or do not feel safe at home. Summary Adopting a healthy lifestyle and getting preventive care are important in promoting health and wellness. Follow your health care provider's instructions about healthy diet, exercising, and getting tested or screened for diseases. Follow your health care provider's instructions on monitoring your cholesterol and blood pressure. This information is not intended to replace advice given to you by your health care provider. Make sure you discuss any questions you have with your health care provider. Document Revised: 06/12/2020 Document Reviewed: 06/12/2020 Elsevier Patient Education  2023 [employer]  

## 2022-01-31 LAB — HEPATITIS C ANTIBODY: Hepatitis C Ab: NONREACTIVE

## 2022-02-18 ENCOUNTER — Ambulatory Visit: Payer: No Typology Code available for payment source | Admitting: Endocrinology

## 2022-02-19 ENCOUNTER — Ambulatory Visit: Payer: No Typology Code available for payment source | Admitting: Internal Medicine

## 2022-02-22 ENCOUNTER — Encounter: Payer: Self-pay | Admitting: Endocrinology

## 2022-02-22 ENCOUNTER — Ambulatory Visit (INDEPENDENT_AMBULATORY_CARE_PROVIDER_SITE_OTHER): Payer: No Typology Code available for payment source | Admitting: Endocrinology

## 2022-02-22 VITALS — BP 118/76 | HR 85 | Ht 72.0 in | Wt 288.0 lb

## 2022-02-22 DIAGNOSIS — E669 Obesity, unspecified: Secondary | ICD-10-CM | POA: Diagnosis not present

## 2022-02-22 DIAGNOSIS — I10 Essential (primary) hypertension: Secondary | ICD-10-CM | POA: Diagnosis not present

## 2022-02-22 DIAGNOSIS — E1169 Type 2 diabetes mellitus with other specified complication: Secondary | ICD-10-CM

## 2022-02-22 LAB — BASIC METABOLIC PANEL
BUN: 9 mg/dL (ref 6–23)
CO2: 27 mEq/L (ref 19–32)
Calcium: 9.7 mg/dL (ref 8.4–10.5)
Chloride: 104 mEq/L (ref 96–112)
Creatinine, Ser: 0.83 mg/dL (ref 0.40–1.50)
GFR: 102.64 mL/min (ref >60.00)
Glucose, Bld: 115 mg/dL — ABNORMAL HIGH (ref 70–99)
Potassium: 4 mEq/L (ref 3.5–5.1)
Sodium: 140 mEq/L (ref 135–145)

## 2022-02-22 LAB — POCT GLYCOSYLATED HEMOGLOBIN (HGB A1C): Hemoglobin A1C: 5.8 % — AB (ref 4.0–5.6)

## 2022-02-22 MED ORDER — TIRZEPATIDE 15 MG/0.5ML ~~LOC~~ SOAJ: 15.0000 mg | SUBCUTANEOUS | 2 refills | Status: DC

## 2022-02-22 NOTE — Progress Notes (Signed)
Patient ID: Tyler Benton, male   DOB: Feb 11, 1970, 52 y.o.   MRN: [id number]            Reason for Appointment: Follow-up for Type 2 Diabetes and hypertension  Referring physician: Scarlette Calico  History of Present Illness:          Diagnosis: Type 2 diabetes mellitus, date of diagnosis: 2014       Past history: He had routine labs in 2014 which showed A1c of 6.6 and glucose of 158 fasting This was felt to be borderline and he was not started on any medication He was given Belviq for weight loss but he did not continue this after 2 weeks since he did not help and he found it expensive He was referred for evaluation because of poor blood sugar control in 8/15 and his blood sugar was significantly high with fasting glucose of 266 and A1c of 9.2 This was despite his trying to lose weight and he had lost 30 pounds along with exercising regularly His PCP had started treatment of his diabetes with a combination treatment using Victoza and Xigduo He was having poor control of his diabetes with A1c 9.2 in 2/17 because of noncompliance with on measures of self-care and medications and having symptomatic hyperglycemia  A1c done in 12/19 had gone up to 13%  Recent history:    Non-insulin hypoglycemic drugs : Mounjaro 10 mg weekly, Synjardy XR 12.06/998, 2 tablets daily  His A1c is consistently in the normal range and now the same at 5.8  A1c lower than expected for actual blood sugars   Current management, blood sugar patterns and problems identified:  He has been on Mounjaro since about September when he was last seen His dose has been increased from 5 up to 10 mg in October He has no nausea but has not seen any change in his satiety He is taking this regularly every Saturday As however lost about 4 pounds, previously was getting some weight Overall he thinks he is trying to do better with his diet and avoiding fast food and only rarely having fried food Most of his blood sugars at home  are in the mornings and average about the same as before Has finally gone back to exercise at the gym about 4 days a week Not taking insulin now   Side effects from medications have been:  none  His dinnertime is variable in the evenings, usually first meal of the day is about 12 noon  Glucose monitoring:  Less than once a day on an average      Glucometer: One touch Verio   Blood Glucose readings from meter review:    PRE-MEAL Fasting Lunch Dinner Bedtime Overall  Glucose range:       Mean/median: 133       POST-MEAL PC Breakfast PC Lunch PC Dinner  Glucose range:     Mean/median:       PRE-MEAL Mornings Lunch Dinner Bedtime Overall  Glucose range: 104-195   98-191 98-195  Mean/median: 135   145 129   POST-MEAL PC Breakfast PC Lunch PC Dinner  Glucose range:   102-140  Mean/median:   116      Self-care: He has been consistent with diet usually   Meals: 3 meals per day. Breakfast is protein shake or oatmeal, lunch is frequently salad or sandwich. Dinner usually sandwich         Dietician visit, most recent: 10/15  Weight history: was 380 in 2014  Wt Readings from Last 3 Encounters:  02/22/22 288 lb (130.6 kg)  01/30/22 287 lb (130.2 kg)  11/27/21 291 lb 4.8 oz (132.1 kg)    Glycemic control:   Lab Results  Component Value Date   HGBA1C 5.8 (A) 02/22/2022   HGBA1C 5.7 (A) 10/19/2021   HGBA1C 5.7 (A) 05/31/2021   Lab Results  Component Value Date   MICROALBUR 0.7 10/19/2021   LDLCALC 41 01/30/2022   CREATININE 1.01 10/19/2021   Lab Results  Component Value Date   FRUCTOSAMINE 357 (H) 02/05/2018   FRUCTOSAMINE 235 05/08/2015     Other active problems: See review of systems   Allergies as of 02/22/2022   No Known Allergies      Medication List        Accurate as of February 22, 2022 12:03 PM. If you have any questions, ask your nurse or doctor.          amLODipine 5 MG tablet Commonly known as: NORVASC TAKE 1 TABLET(5 MG) BY  MOUTH DAILY   chlorpheniramine-HYDROcodone 10-8 MG/5ML Commonly known as: TUSSIONEX Take 5 mLs by mouth every 12 (twelve) hours as needed for cough.   ciprofloxacin 500 MG tablet Commonly known as: CIPRO Take 1 tablet (500 mg total) by mouth 2 (two) times daily.   Insulin Lispro Prot & Lispro (75-25) 100 UNIT/ML Kwikpen Commonly known as: HUMALOG 75/25 MIX INJECT 30 UNITS INTO THE SKIN DAILY BEFORE SUPPER, INJECT 32 UNITS UNDER THE SKIN THREE TIMES DAILY BEFORE MEALS IF BLOOD SUGAR IS OVER 130   Insulin Pen Needle 31G X 5 MM Misc Use on insulin pen 2x daily   irbesartan 150 MG tablet Commonly known as: AVAPRO TAKE 1 TABLET(150 MG) BY MOUTH DAILY   nabumetone 750 MG tablet Commonly known as: RELAFEN TAKE 1 TABLET(750 MG) BY MOUTH TWICE DAILY WITH FOOD AS NEEDED FOR MILD PAIN OR MODERATE PAIN   neomycin-polymyxin-hydrocortisone 3.5-10000-1 OTIC suspension Commonly known as: CORTISPORIN Place 3 drops into both ears 3 (three) times daily.   OneTouch Delica Lancets 95A Misc Use to check blood sugar once a day   OneTouch Verio test strip Generic drug: glucose blood USE TO TEST BLOOD SUGAR ONCE DAILY AS DIRECTED   OneTouch Verio w/Device Kit Use to check blood sugar once a day   rosuvastatin 20 MG tablet Commonly known as: CRESTOR TAKE 1 TABLET(20 MG) BY MOUTH DAILY   Synjardy XR 12.06-998 MG Tb24 Generic drug: Empagliflozin-metFORMIN HCl ER TAKE 2 TABLETS BY MOUTH DAILY  WITH BREAKFAST   tamsulosin 0.4 MG Caps capsule Commonly known as: FLOMAX Take 1 capsule (0.4 mg total) by mouth daily after supper.   tirzepatide 10 MG/0.5ML Pen Commonly known as: MOUNJARO Inject 10 mg into the skin once a week.        Allergies: No Known Allergies  Past Medical History:  Diagnosis Date   Diabetes mellitus without complication (HCC)    GERD (gastroesophageal reflux disease)    Obesity     Past Surgical History:  Procedure Laterality Date   GROWTH PLATE SURGERY  Bilateral 1980s   WISDOM TOOTH EXTRACTION      Family History  Problem Relation Age of Onset   Heart disease Mother    Hypertension Mother    Early death Neg Hx    Alcohol abuse Neg Hx    Birth defects Neg Hx    Cancer Neg Hx    COPD Neg Hx    Drug  abuse Neg Hx    Hyperlipidemia Neg Hx    Kidney disease Neg Hx    Stroke Neg Hx    Thyroid disease Neg Hx    Diabetes Maternal Grandfather     Social History:  reports that he has never smoked. He has never used smokeless tobacco. He reports that he does not drink alcohol and does not use drugs.    Review of Systems         Lipids: He has been prescribed Crestor 20 mg daily by his PCP, labs as follows      Lab Results  Component Value Date   CHOL 103 01/30/2022   HDL 39.40 01/30/2022   LDLCALC 41 01/30/2022   LDLDIRECT 141.3 01/12/2014   TRIG 113.0 01/30/2022   CHOLHDL 3 01/30/2022                  Thyroid: He was found to have left-sided thyroid nodule on initial exam and this was benign on biopsy On exam he has had a 2.5-3 cm nodule on the left side  Lab Results  Component Value Date   TSH 0.72 01/30/2022    HYPERTENSION: He is still on amlodipine 5 mg and Avapro 150 mg daily Previously blood pressure was high because of not tolerating HCTZ which caused excessive urination Indapamide caused blood pressure to be as low as 90  He has been checking blood pressure at work periodically    BP Readings from Last 3 Encounters:  02/22/22 118/76  01/30/22 124/80  11/27/21 112/77     LABS:  Office Visit on 02/22/2022  Component Date Value Ref Range Status   Hemoglobin A1C 02/22/2022 5.8 (A)  4.0 - 5.6 % Final    Physical Examination:  BP 118/76   Pulse 85   Ht 6' (1.829 m)   Wt 288 lb (130.6 kg)   SpO2 96%   BMI 39.06 kg/m     ASSESSMENT/PLAN:  Diabetes type 2 with obesity  See history of present illness for detailed discussion of his current management, blood sugar patterns and problems  identified  A1c is again lower than expected at 5.8 %  Current regimen is Mounjaro and Synjardy  His blood sugars at home are about the same although not taking after meals Has only had mild benefit with changing to [facility] but not clear what his postprandial readings are Likely will be able to do better with consistent diet, exercise and lifestyle changes   Recommendations: He will go up to 15 mg Mounjaro on his next prescription Continue lifestyle changes and efforts to lose weight More monitoring of glucose after meals  Follow-up chemistry panel done today   Patient Instructions  Check blood sugars on waking up days a week  Also check blood sugars about 2 hours after meals and do this after different meals by rotation  Recommended blood sugar levels on waking up are 90-130 and about 2 hours after meal is 130-160  Please bring your blood sugar monitor to each visit, thank you    Reather Littler 02/22/2022, 12:03 PM   Note: This office note was prepared with Dragon voice recognition system technology. Any transcriptional errors that result from this process are unintentional.

## 2022-02-22 NOTE — Patient Instructions (Signed)
Check blood sugars on waking up days a week  Also check blood sugars about 2 hours after meals and do this after different meals by rotation  Recommended blood sugar levels on waking up are 90-130 and about 2 hours after meal is 130-160  Please bring your blood sugar monitor to each visit, thank you   

## 2022-02-26 ENCOUNTER — Ambulatory Visit
Admission: RE | Admit: 2022-02-26 | Discharge: 2022-02-26 | Disposition: A | Discharge status: Home / Self Care | Payer: No Typology Code available for payment source | Source: Ambulatory Visit | Attending: Internal Medicine | Admitting: Internal Medicine

## 2022-02-26 ENCOUNTER — Ambulatory Visit: Payer: No Typology Code available for payment source | Admitting: Internal Medicine

## 2022-02-26 DIAGNOSIS — R918 Other nonspecific abnormal finding of lung field: Secondary | ICD-10-CM

## 2022-04-08 ENCOUNTER — Other Ambulatory Visit: Payer: Self-pay | Admitting: Endocrinology

## 2022-04-08 DIAGNOSIS — E785 Hyperlipidemia, unspecified: Secondary | ICD-10-CM

## 2022-04-18 ENCOUNTER — Telehealth: Payer: Self-pay

## 2022-04-18 DIAGNOSIS — E1169 Type 2 diabetes mellitus with other specified complication: Secondary | ICD-10-CM

## 2022-04-18 NOTE — Telephone Encounter (Signed)
Pt would like to know if theres an alternative to Mounjaro 15 mg due to the shortage, states he has been out and were expecting his pharmacy to have it ordered but they still have it on backorder

## 2022-04-19 MED ORDER — SEMAGLUTIDE (2 MG/DOSE) 8 MG/3ML ~~LOC~~ SOPN: 2.0000 mg | PEN_INJECTOR | SUBCUTANEOUS | 0 refills | Status: DC

## 2022-04-19 NOTE — Addendum Note (Signed)
Addended by: Sarina Ill on: 04/19/2022 02:34 PM   Modules accepted: Orders

## 2022-04-19 NOTE — Telephone Encounter (Signed)
Pt states that he has not been off of Mounjaro for more than 1 week, rx for Ozempic 2mg /wkly now sent.

## 2022-05-23 ENCOUNTER — Telehealth: Payer: Self-pay | Admitting: Pharmacy Technician

## 2022-05-23 NOTE — Telephone Encounter (Signed)
Received fax PA form from [facility] for Rybelsus.  Per pt's chart he switched to [facility]. Indexing form in case it's needed later.

## 2022-06-13 ENCOUNTER — Other Ambulatory Visit: Payer: Self-pay | Admitting: Internal Medicine

## 2022-06-13 ENCOUNTER — Telehealth: Payer: Self-pay | Admitting: Endocrinology

## 2022-06-13 ENCOUNTER — Telehealth: Payer: Self-pay

## 2022-06-13 DIAGNOSIS — I10 Essential (primary) hypertension: Secondary | ICD-10-CM

## 2022-06-13 DIAGNOSIS — E139 Other specified diabetes mellitus without complications: Secondary | ICD-10-CM

## 2022-06-13 MED ORDER — OZEMPIC (2 MG/DOSE) 8 MG/3ML ~~LOC~~ SOPN: PEN_INJECTOR | SUBCUTANEOUS | 0 refills | Status: DC

## 2022-06-13 NOTE — Telephone Encounter (Signed)
Patient Advocate Encounter   Received notification from [facility] that prior authorization is required for Ozempic  Submitted: 06/13/22 Key [id number]  Status is pending

## 2022-06-13 NOTE — Telephone Encounter (Signed)
Done

## 2022-06-13 NOTE — Telephone Encounter (Signed)
MEDICATION: emaglutide (2 MG/DOSE) Semaglutide, 2 MG/DOSE, 8 MG/3ML SOPN  PHARMACY:    [id number] - [place], [place] - [address] (Ph: [phone])    HAS THE PATIENT CONTACTED THEIR PHARMACY?  Yes  IS THIS A 90 DAY SUPPLY : Yes  IS PATIENT OUT OF MEDICATION: Yes  IF NOT; HOW MUCH IS LEFT:   LAST APPOINTMENT DATE: @1 /192024  NEXT APPOINTMENT DATE:@5 /28/2024  DO WE HAVE YOUR PERMISSION TO LEAVE A DETAILED MESSAGE?: Yes  OTHER COMMENTS:    **Let patient know to contact pharmacy at the end of the day to make sure medication is ready. **  ** Please notify patient to allow 48-72 hours to process**  **Encourage patient to contact the pharmacy for refills or they can request refills through [facility]**

## 2022-06-14 NOTE — Telephone Encounter (Signed)
Message from Plan PA Case: [id number], Status: Approved, Coverage Starts on: 06/13/2022 12:00:00 AM, Coverage Ends on: 06/13/2023 12:00:00 AM.. Authorization Expiration Date: Jun 13, 2023.

## 2022-06-18 ENCOUNTER — Other Ambulatory Visit: Payer: Self-pay | Admitting: Endocrinology

## 2022-06-18 DIAGNOSIS — E1169 Type 2 diabetes mellitus with other specified complication: Secondary | ICD-10-CM

## 2022-06-19 ENCOUNTER — Encounter: Payer: Self-pay | Admitting: Internal Medicine

## 2022-06-19 ENCOUNTER — Ambulatory Visit (INDEPENDENT_AMBULATORY_CARE_PROVIDER_SITE_OTHER): Payer: Self-pay | Admitting: Internal Medicine

## 2022-06-19 VITALS — BP 148/94 | HR 79 | Temp 98.8°F | Resp 16 | Ht 72.0 in | Wt 293.0 lb

## 2022-06-19 DIAGNOSIS — I10 Essential (primary) hypertension: Secondary | ICD-10-CM

## 2022-06-19 DIAGNOSIS — E139 Other specified diabetes mellitus without complications: Secondary | ICD-10-CM

## 2022-06-19 DIAGNOSIS — J301 Allergic rhinitis due to pollen: Secondary | ICD-10-CM

## 2022-06-19 DIAGNOSIS — R9431 Abnormal electrocardiogram [ECG] [EKG]: Secondary | ICD-10-CM

## 2022-06-19 DIAGNOSIS — M5136 Other intervertebral disc degeneration, lumbar region: Secondary | ICD-10-CM

## 2022-06-19 DIAGNOSIS — E785 Hyperlipidemia, unspecified: Secondary | ICD-10-CM

## 2022-06-19 LAB — URINALYSIS, ROUTINE W REFLEX MICROSCOPIC
Bilirubin Urine: NEGATIVE
Ketones, ur: NEGATIVE
Leukocytes,Ua: NEGATIVE
Nitrite: NEGATIVE
Specific Gravity, Urine: >=1.03 — AB (ref 1.000–1.030)
Total Protein, Urine: NEGATIVE
Urine Glucose: NEGATIVE
Urobilinogen, UA: 0.2 (ref 0.0–1.0)
pH: 5.5 (ref 5.0–8.0)

## 2022-06-19 LAB — HEPATIC FUNCTION PANEL
ALT: 23 U/L (ref 0–53)
AST: 22 U/L (ref 0–37)
Albumin: 4.3 g/dL (ref 3.5–5.2)
Alkaline Phosphatase: 63 U/L (ref 39–117)
Bilirubin, Direct: 0.2 mg/dL (ref 0.0–0.3)
Total Bilirubin: 1.2 mg/dL (ref 0.2–1.2)
Total Protein: 7.8 g/dL (ref 6.0–8.3)

## 2022-06-19 LAB — CBC WITH DIFFERENTIAL/PLATELET
Basophils Absolute: 0.1 10*3/uL (ref 0.0–0.1)
Basophils Relative: 0.9 % (ref 0.0–3.0)
Eosinophils Absolute: 0.1 10*3/uL (ref 0.0–0.7)
Eosinophils Relative: 1.9 % (ref 0.0–5.0)
HCT: 48.1 % (ref 39.0–52.0)
Hemoglobin: 16.3 g/dL (ref 13.0–17.0)
Lymphocytes Relative: 24 % (ref 12.0–46.0)
Lymphs Abs: 1.5 10*3/uL (ref 0.7–4.0)
MCHC: 33.9 g/dL (ref 30.0–36.0)
MCV: 96.5 fl (ref 78.0–100.0)
Monocytes Absolute: 0.6 10*3/uL (ref 0.1–1.0)
Monocytes Relative: 10.3 % (ref 3.0–12.0)
Neutro Abs: 3.9 10*3/uL (ref 1.4–7.7)
Neutrophils Relative %: 62.9 % (ref 43.0–77.0)
Platelets: 320 10*3/uL (ref 150.0–400.0)
RBC: 4.99 Mil/uL (ref 4.22–5.81)
RDW: 14.5 % (ref 11.5–15.5)
WBC: 6.1 10*3/uL (ref 4.0–10.5)

## 2022-06-19 LAB — BASIC METABOLIC PANEL
BUN: 9 mg/dL (ref 6–23)
CO2: 28 mEq/L (ref 19–32)
Calcium: 9.9 mg/dL (ref 8.4–10.5)
Chloride: 103 mEq/L (ref 96–112)
Creatinine, Ser: 0.89 mg/dL (ref 0.40–1.50)
GFR: 100.27 mL/min (ref >60.00)
Glucose, Bld: 125 mg/dL — ABNORMAL HIGH (ref 70–99)
Potassium: 3.9 mEq/L (ref 3.5–5.1)
Sodium: 138 mEq/L (ref 135–145)

## 2022-06-19 LAB — MICROALBUMIN / CREATININE URINE RATIO
Creatinine,U: 185.4 mg/dL
Microalb Creat Ratio: 0.6 mg/g (ref 0.0–30.0)
Microalb, Ur: 1 mg/dL (ref 0.0–1.9)

## 2022-06-19 LAB — HEMOGLOBIN A1C: Hgb A1c MFr Bld: 6.1 % (ref 4.6–6.5)

## 2022-06-19 NOTE — Progress Notes (Signed)
Subjective:  Patient ID: Tyler Benton, male    DOB: 1970/09/04  Age: 52 y.o. MRN: [id number]  CC: Hypertension and Diabetes   HPI Tyler Benton presents for f/up ---  He does not exercise.    He complains of a several week history of dark urine.  He denies abdominal pain, dysuria, or hematuria.  He complains of a several month history of nasal congestion and puffiness around his eyelids.  Outpatient Medications Prior to Visit  Medication Sig Dispense Refill   Blood Glucose Monitoring Suppl (ONETOUCH VERIO) w/Device KIT Use to check blood sugar once a day 1 kit 0   Empagliflozin-metFORMIN HCl ER (SYNJARDY XR) 12.06-998 MG TB24 TAKE 2 TABLETS BY MOUTH DAILY WITH BREAKFAST 60 tablet 1   glucose blood (ONETOUCH VERIO) test strip USE TO TEST BLOOD SUGAR ONCE DAILY AS DIRECTED 100 strip 2   irbesartan (AVAPRO) 150 MG tablet TAKE 1 TABLET(150 MG) BY MOUTH DAILY 90 tablet 0   OneTouch Delica Lancets 33G MISC Use to check blood sugar once a day 100 each 2   rosuvastatin (CRESTOR) 20 MG tablet TAKE 1 TABLET(20 MG) BY MOUTH DAILY 90 tablet 0   Semaglutide, 2 MG/DOSE, (OZEMPIC, 2 MG/DOSE,) 8 MG/3ML SOPN Inject 2mg  weekly 3 mL 0   amLODipine (NORVASC) 5 MG tablet TAKE 1 TABLET(5 MG) BY MOUTH DAILY 30 tablet 3   Semaglutide, 2 MG/DOSE, 8 MG/3ML SOPN Inject 2 mg as directed once a week. 3 mL 0   tamsulosin (FLOMAX) 0.4 MG CAPS capsule Take 1 capsule (0.4 mg total) by mouth daily after supper. (Patient not taking: Reported on 01/30/2022) 10 capsule 1   chlorpheniramine-HYDROcodone (TUSSIONEX) 10-8 MG/5ML Take 5 mLs by mouth every 12 (twelve) hours as needed for cough. (Patient not taking: Reported on 02/22/2022) 70 mL 0   ciprofloxacin (CIPRO) 500 MG tablet Take 1 tablet (500 mg total) by mouth 2 (two) times daily. (Patient not taking: Reported on 01/30/2022) 14 tablet 0   Insulin Pen Needle 31G X 5 MM MISC Use on insulin pen 2x daily (Patient not taking: Reported on 02/22/2022) 100 each 1    nabumetone (RELAFEN) 750 MG tablet TAKE 1 TABLET(750 MG) BY MOUTH TWICE DAILY WITH FOOD AS NEEDED FOR MILD PAIN OR MODERATE PAIN (Patient not taking: Reported on 10/19/2021) 60 tablet 3   neomycin-polymyxin-hydrocortisone (CORTISPORIN) 3.5-10000-1 OTIC suspension Place 3 drops into both ears 3 (three) times daily. (Patient not taking: Reported on 02/22/2022) 10 mL 2   tirzepatide (MOUNJARO) 15 MG/0.5ML Pen Inject 15 mg into the skin once a week. (Patient not taking: Reported on 06/19/2022) 6 mL 2   No facility-administered medications prior to visit.    ROS Review of Systems  Constitutional:  Positive for unexpected weight change (wt gain). Negative for appetite change, chills, diaphoresis, fatigue and fever.  HENT:  Positive for congestion, facial swelling, postnasal drip and rhinorrhea. Negative for sinus pressure, sinus pain and sneezing.   Eyes:  Negative for pain, discharge, redness, itching and visual disturbance.  Respiratory:  Negative for cough, chest tightness, shortness of breath and wheezing.   Cardiovascular:  Positive for leg swelling. Negative for chest pain and palpitations.  Gastrointestinal:  Negative for abdominal pain.  Endocrine: Negative.   Genitourinary: Negative.  Negative for difficulty urinating.  Musculoskeletal:  Positive for arthralgias and back pain. Negative for myalgias.  Skin:  Negative for color change and rash.  Neurological:  Negative for dizziness, weakness, light-headedness and headaches.  Hematological:  Negative for adenopathy.  Does not bruise/bleed easily.  Psychiatric/Behavioral: Negative.      Objective:  BP (!) 148/94 (BP Location: Left Arm, Patient Position: Sitting, Cuff Size: Normal)   Pulse 79   Temp 98.8 F (37.1 C) (Oral)   Resp 16   Ht 6' (1.829 m)   Wt 293 lb (132.9 kg)   SpO2 95%   BMI 39.74 kg/m   BP Readings from Last 3 Encounters:  06/19/22 (!) 148/94  02/22/22 118/76  01/30/22 124/80    Wt Readings from Last 3 Encounters:   06/19/22 293 lb (132.9 kg)  02/22/22 288 lb (130.6 kg)  01/30/22 287 lb (130.2 kg)    Physical Exam Vitals reviewed.  HENT:     Nose: Congestion and rhinorrhea present. Rhinorrhea is clear.     Right Nostril: No epistaxis.     Left Nostril: No epistaxis.     Right Sinus: No maxillary sinus tenderness or frontal sinus tenderness.     Left Sinus: No maxillary sinus tenderness or frontal sinus tenderness.     Comments: Mild swelling over the lower eyelids and upper malar sirfaces    Mouth/Throat:     Mouth: Mucous membranes are moist.  Eyes:     General: No scleral icterus.    Conjunctiva/sclera: Conjunctivae normal.  Cardiovascular:     Rate and Rhythm: Normal rate and regular rhythm.     Heart sounds: No murmur heard.    Comments: EKG- NSR, 77 bpm ?LAE Flat T waves in V1, III, and V6 No LVH or Q waves Pulmonary:     Effort: Pulmonary effort is normal.     Breath sounds: No stridor. No wheezing, rhonchi or rales.  Abdominal:     General: Abdomen is flat.     Palpations: There is no mass.     Tenderness: There is no abdominal tenderness. There is no guarding.     Hernia: No hernia is present.  Musculoskeletal:     Cervical back: Neck supple.     Right lower leg: No edema.     Left lower leg: No edema.  Lymphadenopathy:     Cervical: No cervical adenopathy.  Skin:    General: Skin is warm and dry.     Findings: No rash.  Neurological:     General: No focal deficit present.     Mental Status: He is alert. Mental status is at baseline.     Lab Results  Component Value Date   WBC 6.1 06/19/2022   HGB 16.3 06/19/2022   HCT 48.1 06/19/2022   PLT 320.0 06/19/2022   GLUCOSE 125 (H) 06/19/2022   CHOL 103 01/30/2022   TRIG 113.0 01/30/2022   HDL 39.40 01/30/2022   LDLDIRECT 141.3 01/12/2014   LDLCALC 41 01/30/2022   ALT 23 06/19/2022   AST 22 06/19/2022   NA 138 06/19/2022   K 3.9 06/19/2022   CL 103 06/19/2022   CREATININE 0.89 06/19/2022   BUN 9 06/19/2022    CO2 28 06/19/2022   TSH 0.72 01/30/2022   PSA 0.33 01/30/2022   HGBA1C 6.1 06/19/2022   MICROALBUR 1.0 06/19/2022    CT Chest Wo Contrast  Result Date: 02/28/2022 CLINICAL DATA:  Abnormal x-ray. Lung opacities/opacities. Evaluate for lung nodule. Former smoker. EXAM: CT CHEST WITHOUT CONTRAST TECHNIQUE: Multidetector CT imaging of the chest was performed following the standard protocol without IV contrast. RADIATION DOSE REDUCTION: This exam was performed according to the departmental dose-optimization program which includes automated exposure control, adjustment of the mA  and/or kV according to patient size and/or use of iterative reconstruction technique. COMPARISON:  Chest two views 01/30/2022, frontal chest radiograph 11/20/2016 FINDINGS: Cardiovascular: Heart size is normal. No pericardial effusion. No thoracic aortic aneurysm. Mediastinum/Nodes: No axillary, mediastinal, or hilar pathologically enlarged lymph nodes by CT criteria. The esophagus follows a normal course of normal caliber. There is enlargement of the left thyroid lobe. Mild calcifications seen inferiorly. No discrete thyroid nodule is seen. This is consistent with a left thyroid goiter. This was the cause for mild impression on the left aspect of the trachea on recent radiograph. Lungs/Pleura: The central airways are patent. Small superior extension of mesenteric fat into the posteroinferior right hemithorax, a Bochdalek hernia. Mild curvilinear scarring or possibly subsegmental atelectasis within the anterior right upper lobe. Mild curvilinear and ground-glass subsegmental atelectasis versus scarring within the posterior bilateral lower lobes. 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 Mild curvilinear subsegmental atelectasis versus scarring within the lingula. No suspicious pulmonary nodule is seen. No  pleural effusion or pneumothorax. Upper Abdomen: Small 17 mm soft tissue likely splenule inferior and anterior to the spleen abutting the anterolateral left upper abdominal quadrant pleura (axial series 2, images 140-145/145). Musculoskeletal: On the prior 01/30/2022 lateral chest radiograph, at the approximate inferior T7 level there was an approximately 2.3 cm dense radiopacity overlying the posteroinferior T7 vertebral body. 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 There is also chronic cortical thickening and peripheral osteophytosis at the left eighth rib. There is similar but more mild endplate osteophytosis and costovertebral joint space narrowing and peripheral osteophytosis of the right T7-8 disc level at the junction with the right eighth rib. There is leads anterior bridging osteophyte at the anterior left T11-12 level. Mild-to-moderate disc space narrowing of the upper thoracic spine. Incidental note of moderate right and mild-to-moderate left gynecomastia. IMPRESSION: 1. 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 There is also chronic benign cortical thickening and peripheral osteophytosis at the left eighth rib. 2. No suspicious pulmonary nodule is seen. Electronically Signed   By: Neita Garnet M.D.   On: 02/28/2022 12:01    Assessment & Plan:   Diabetes 1.5, managed as type 1 (HCC)- His blood sugar is very well-controlled. -     Basic metabolic panel; Future -     Hemoglobin A1c; Future -     Urinalysis, Routine w reflex microscopic; Future -     Microalbumin / creatinine urine ratio; Future -     CT CARDIAC SCORING (SELF PAY ONLY); Future  Essential hypertension- Will try  to get better control of his blood pressure. -     Basic metabolic panel; Future -     CBC with Differential/Platelet; Future -     Hepatic function panel; Future -     EKG 12-Lead -     amLODIPine Besylate; Take 1 tablet (10 mg total) by mouth daily.  Dispense: 90 tablet; Refill: 0 -     Indapamide; Take 1 tablet (1.25 mg total) by mouth daily.  Dispense: 90 tablet; Refill: 0 -     CT CARDIAC SCORING (SELF PAY ONLY); Future  DDD (degenerative disc disease), lumbar  Seasonal allergic  rhinitis due to pollen -     Levocetirizine Dihydrochloride; Take 1 tablet (5 mg total) by mouth every evening.  Dispense: 90 tablet; Refill: 0 -     Montelukast Sodium; Take 1 tablet (10 mg total) by mouth at bedtime.  Dispense: 90 tablet; Refill: 0  Hyperlipidemia LDL goal <100- LDL goal achieved. Doing well on the statin  -     CT CARDIAC SCORING (SELF PAY ONLY); Future  Abnormal electrocardiogram- Will evaluate for CAD. -     CT CARDIAC SCORING (SELF PAY ONLY); Future     Follow-up: Return in about 3 months (around 09/19/2022).  Sanda Linger, MD

## 2022-06-19 NOTE — Patient Instructions (Signed)
Hypertension, Adult High blood pressure (hypertension) is when the force of blood pumping through the arteries is too strong. The arteries are the blood vessels that carry blood from the heart throughout the body. Hypertension forces the heart to work harder to pump blood and may cause arteries to become narrow or stiff. Untreated or uncontrolled hypertension can lead to a heart attack, heart failure, a stroke, kidney disease, and other problems. A blood pressure reading consists of a higher number over a lower number. Ideally, your blood pressure should be below 120/80. The first ("top") number is called the systolic pressure. It is a measure of the pressure in your arteries as your heart beats. The second ("bottom") number is called the diastolic pressure. It is a measure of the pressure in your arteries as the heart relaxes. What are the causes? The exact cause of this condition is not known. There are some conditions that result in high blood pressure. What increases the risk? Certain factors may make you more likely to develop high blood pressure. Some of these risk factors are under your control, including: Smoking. Not getting enough exercise or physical activity. Being overweight. Having too much fat, sugar, calories, or salt (sodium) in your diet. Drinking too much alcohol. Other risk factors include: Having a personal history of heart disease, diabetes, high cholesterol, or kidney disease. Stress. Having a family history of high blood pressure and high cholesterol. Having obstructive sleep apnea. Age. The risk increases with age. What are the signs or symptoms? High blood pressure may not cause symptoms. Very high blood pressure (hypertensive crisis) may cause: Headache. Fast or irregular heartbeats (palpitations). Shortness of breath. Nosebleed. Nausea and vomiting. Vision changes. Severe chest pain, dizziness, and seizures. How is this diagnosed? This condition is diagnosed by  measuring your blood pressure while you are seated, with your arm resting on a flat surface, your legs uncrossed, and your feet flat on the floor. The cuff of the blood pressure monitor will be placed directly against the skin of your upper arm at the level of your heart. Blood pressure should be measured at least twice using the same arm. Certain conditions can cause a difference in blood pressure between your right and left arms. If you have a high blood pressure reading during one visit or you have normal blood pressure with other risk factors, you may be asked to: Return on a different day to have your blood pressure checked again. Monitor your blood pressure at home for 1 week or longer. If you are diagnosed with hypertension, you may have other blood or imaging tests to help your health care provider understand your overall risk for other conditions. How is this treated? This condition is treated by making healthy lifestyle changes, such as eating healthy foods, exercising more, and reducing your alcohol intake. You may be referred for counseling on a healthy diet and physical activity. Your health care provider may prescribe medicine if lifestyle changes are not enough to get your blood pressure under control and if: Your systolic blood pressure is above 130. Your diastolic blood pressure is above 80. Your personal target blood pressure may vary depending on your medical conditions, your age, and other factors. Follow these instructions at home: Eating and drinking  Eat a diet that is high in fiber and potassium, and low in sodium, added sugar, and fat. An example of this eating plan is called the DASH diet. DASH stands for Dietary Approaches to Stop Hypertension. To eat this way: Eat   plenty of fresh fruits and vegetables. Try to fill one half of your plate at each meal with fruits and vegetables. Eat whole grains, such as whole-wheat pasta, brown rice, or whole-grain bread. Fill about one  fourth of your plate with whole grains. Eat or drink low-fat dairy products, such as skim milk or low-fat yogurt. Avoid fatty cuts of meat, processed or cured meats, and poultry with skin. Fill about one fourth of your plate with lean proteins, such as fish, chicken without skin, beans, eggs, or tofu. Avoid pre-made and processed foods. These tend to be higher in sodium, added sugar, and fat. Reduce your daily sodium intake. Many people with hypertension should eat less than 1,500 mg of sodium a day. Do not drink alcohol if: Your health care provider tells you not to drink. You are pregnant, may be pregnant, or are planning to become pregnant. If you drink alcohol: Limit how much you have to: 0-1 drink a day for women. 0-2 drinks a day for men. Know how much alcohol is in your drink. In the [place] one drink equals one 12 oz bottle of beer (355 mL), one 5 oz glass of wine (148 mL), or one 1 oz glass of hard liquor (44 mL). Lifestyle  Work with your health care provider to maintain a healthy body weight or to lose weight. Ask what an ideal weight is for you. Get at least 30 minutes of exercise that causes your heart to beat faster (aerobic exercise) most days of the week. Activities may include walking, swimming, or biking. Include exercise to strengthen your muscles (resistance exercise), such as Pilates or lifting weights, as part of your weekly exercise routine. Try to do these types of exercises for 30 minutes at least 3 days a week. Do not use any products that contain nicotine or tobacco. These products include cigarettes, chewing tobacco, and vaping devices, such as e-cigarettes. If you need help quitting, ask your health care provider. Monitor your blood pressure at home as told by your health care provider. Keep all follow-up visits. This is important. Medicines Take over-the-counter and prescription medicines only as told by your health care provider. Follow directions carefully. Blood  pressure medicines must be taken as prescribed. Do not skip doses of blood pressure medicine. Doing this puts you at risk for problems and can make the medicine less effective. Ask your health care provider about side effects or reactions to medicines that you should watch for. Contact a health care provider if you: Think you are having a reaction to a medicine you are taking. Have headaches that keep coming back (recurring). Feel dizzy. Have swelling in your ankles. Have trouble with your vision. Get help right away if you: Develop a severe headache or confusion. Have unusual weakness or numbness. Feel faint. Have severe pain in your chest or abdomen. Vomit repeatedly. Have trouble breathing. These symptoms may be an emergency. Get help right away. Call 911. Do not wait to see if the symptoms will go away. Do not drive yourself to the hospital. Summary Hypertension is when the force of blood pumping through your arteries is too strong. If this condition is not controlled, it may put you at risk for serious complications. Your personal target blood pressure may vary depending on your medical conditions, your age, and other factors. For most people, a normal blood pressure is less than 120/80. Hypertension is treated with lifestyle changes, medicines, or a combination of both. Lifestyle changes include losing weight, eating a healthy,   low-sodium diet, exercising more, and limiting alcohol. This information is not intended to replace advice given to you by your health care provider. Make sure you discuss any questions you have with your health care provider. Document Revised: 11/28/2020 Document Reviewed: 11/28/2020 Elsevier Patient Education  2023 Elsevier Inc.  

## 2022-06-20 ENCOUNTER — Telehealth: Payer: Self-pay

## 2022-06-20 DIAGNOSIS — J301 Allergic rhinitis due to pollen: Secondary | ICD-10-CM | POA: Insufficient documentation

## 2022-06-20 MED ORDER — LEVOCETIRIZINE DIHYDROCHLORIDE 5 MG PO TABS
5.0000 mg | ORAL_TABLET | Freq: Every evening | ORAL | 0 refills | Status: AC
Start: 2022-06-20 — End: ?

## 2022-06-20 MED ORDER — AMLODIPINE BESYLATE 10 MG PO TABS
10.0000 mg | ORAL_TABLET | Freq: Every day | ORAL | 0 refills | Status: DC
Start: 2022-06-20 — End: 2022-09-13

## 2022-06-20 MED ORDER — MONTELUKAST SODIUM 10 MG PO TABS
10.0000 mg | ORAL_TABLET | Freq: Every day | ORAL | 0 refills | Status: DC
Start: 2022-06-20 — End: 2022-09-13

## 2022-06-20 MED ORDER — INDAPAMIDE 1.25 MG PO TABS
1.2500 mg | ORAL_TABLET | Freq: Every day | ORAL | 0 refills | Status: DC
Start: 2022-06-20 — End: 2022-09-13

## 2022-06-20 NOTE — Telephone Encounter (Signed)
Spoke with patients pharmacy in regards to his synjardy and they say that he needs a prior [place]. Patient has been notified that it will take some time to get this done. He has asked we let him know when its approved/denied.

## 2022-06-21 ENCOUNTER — Other Ambulatory Visit (HOSPITAL_COMMUNITY): Payer: Self-pay

## 2022-06-21 ENCOUNTER — Telehealth: Payer: Self-pay

## 2022-06-21 NOTE — Telephone Encounter (Signed)
Patient Advocate Encounter  Prior Authorization for Synjardy XR 12.5-1000MG  er tablets has been approved through [employer].    Key: [id number]  Effective: 06-21-2022 to 06-21-2023

## 2022-06-25 ENCOUNTER — Ambulatory Visit: Payer: No Typology Code available for payment source | Admitting: Endocrinology

## 2022-07-02 ENCOUNTER — Ambulatory Visit: Payer: No Typology Code available for payment source | Admitting: Endocrinology

## 2022-07-15 ENCOUNTER — Telehealth: Payer: Self-pay

## 2022-07-15 MED ORDER — OZEMPIC (2 MG/DOSE) 8 MG/3ML ~~LOC~~ SOPN: PEN_INJECTOR | SUBCUTANEOUS | 0 refills | Status: DC

## 2022-07-15 NOTE — Telephone Encounter (Signed)
Patient needs Ozempic refill.   Refill sent.

## 2022-07-29 ENCOUNTER — Ambulatory Visit: Payer: No Typology Code available for payment source | Admitting: Internal Medicine

## 2022-07-31 ENCOUNTER — Ambulatory Visit: Payer: No Typology Code available for payment source | Admitting: Endocrinology

## 2022-08-07 ENCOUNTER — Telehealth: Payer: Self-pay

## 2022-08-07 ENCOUNTER — Other Ambulatory Visit (HOSPITAL_COMMUNITY): Payer: Self-pay

## 2022-08-07 NOTE — Telephone Encounter (Signed)
Patient needs a new PA for Ozempic 2 mg due to new insurance, Thank you

## 2022-08-07 NOTE — Telephone Encounter (Signed)
Patient Advocate Encounter   Received notification from pt msgs that prior authorization is required for Ozempic due to change of insurance  Submitted: 08/07/22 Key [id number]  Awaiting clinical questions

## 2022-08-09 NOTE — Telephone Encounter (Signed)
Clinical info sent 08/09/22

## 2022-08-14 ENCOUNTER — Encounter: Payer: Self-pay | Admitting: Endocrinology

## 2022-08-14 NOTE — Telephone Encounter (Signed)
Pharmacy Patient Advocate Encounter  Received notification that the request for prior authorization for Ozempic has been denied

## 2022-08-15 ENCOUNTER — Other Ambulatory Visit (HOSPITAL_COMMUNITY): Payer: Self-pay

## 2022-08-15 MED ORDER — TIRZEPATIDE 5 MG/0.5ML ~~LOC~~ SOAJ: 5.0000 mg | SUBCUTANEOUS | 2 refills | Status: DC

## 2022-08-15 NOTE — Telephone Encounter (Signed)
Please start PA for Mounjaro

## 2022-08-15 NOTE — Telephone Encounter (Signed)
Looks like Greggory Keen will also need a PA.   I just spoke to [facility] to ask if they received the appeal paperwork for Ozempic and they stated they had not. This was re faxed to [phone] and marked as URGENT.   Representative stated urgent appeals can take up to 3 business days, however, if they deem it to be non-urgent (non-life threatening), it could take up to 30 days.   Would you like me to go ahead with the PA for [facility]?

## 2022-08-15 NOTE — Telephone Encounter (Signed)
Patient is calling today and would like to speak with Dr. Remus Blake clinical assistant concerning refills and prior authorizations for tirzepatide tirzepatide [facility]) 5 MG/0.5ML Pen  and Ozempic (2 MG/DOSE) Semaglutide, 2 MG/DOSE, (OZEMPIC, 2 MG/DOSE,) 8 MG/3ML SOPN .  Patient states that he has been out of medication for a month now and wants to know why these prior authorizations have not been done.  When patient first called he wanted to speak with the office administrator, but I explained that she is not in the office this week, but would be back next week.

## 2022-08-15 NOTE — Addendum Note (Signed)
Addended by: Lisabeth Pick on: 08/15/2022 07:08 AM   Modules accepted: Orders

## 2022-08-15 NOTE — Telephone Encounter (Signed)
Appeal sent for expedited review 08/15/22

## 2022-08-15 NOTE — Telephone Encounter (Signed)
Medication sent and mychart message sent to patient

## 2022-08-16 ENCOUNTER — Telehealth: Payer: Self-pay

## 2022-08-16 NOTE — Telephone Encounter (Signed)
Pharmacy Patient Advocate Encounter  Received notification from [facility]   that Prior Authorization for Tyler Benton has been APPROVED from 08/16/22 to 08/16/23.Marland Kitchen

## 2022-08-16 NOTE — Telephone Encounter (Signed)
Pharmacy Patient Advocate Encounter   Received notification from Pt Calls Messages that prior authorization for Tyler Benton is required/requested.   PA started via CoverMy[employer]eds. KEY [id number] . Waiting for clinical questions to populate.

## 2022-08-16 NOTE — Telephone Encounter (Signed)
Clinical documentation submitted 08/16/22  PA status is pending

## 2022-08-20 ENCOUNTER — Encounter: Payer: Self-pay | Admitting: Endocrinology

## 2022-08-20 ENCOUNTER — Ambulatory Visit (INDEPENDENT_AMBULATORY_CARE_PROVIDER_SITE_OTHER): Payer: BC Managed Care – PPO | Admitting: Endocrinology

## 2022-08-20 VITALS — BP 120/80 | HR 94 | Ht 72.0 in | Wt 295.0 lb

## 2022-08-20 DIAGNOSIS — E1165 Type 2 diabetes mellitus with hyperglycemia: Secondary | ICD-10-CM

## 2022-08-20 DIAGNOSIS — Z7984 Long term (current) use of oral hypoglycemic drugs: Secondary | ICD-10-CM | POA: Diagnosis not present

## 2022-08-20 DIAGNOSIS — Z7985 Long-term (current) use of injectable non-insulin antidiabetic drugs: Secondary | ICD-10-CM

## 2022-08-20 MED ORDER — TIRZEPATIDE 10 MG/0.5ML ~~LOC~~ SOAJ: 10.0000 mg | SUBCUTANEOUS | 1 refills | Status: DC

## 2022-08-20 MED ORDER — GLIMEPIRIDE 2 MG PO TABS
2.0000 mg | ORAL_TABLET | Freq: Every day | ORAL | 0 refills | Status: DC
Start: 2022-08-20 — End: 2022-09-25

## 2022-08-20 NOTE — Progress Notes (Signed)
Patient ID: Tyler Benton, male   DOB: 1970/11/09, 52 y.o.   MRN: [id number]            Reason for Appointment: Follow-up for Type 2 Diabetes and hypertension  Referring physician: Sanda Linger  History of Present Illness:          Diagnosis: Type 2 diabetes mellitus, date of diagnosis: 2014       Past history: He had routine labs in 2014 which showed A1c of 6.6 and glucose of 158 fasting This was felt to be borderline and he was not started on any medication He was given Belviq for weight loss but he did not continue this after 2 weeks since he did not help and he found it expensive He was referred for evaluation because of poor blood sugar control in 8/15 and his blood sugar was significantly high with fasting glucose of 266 and A1c of 9.2 This was despite his trying to lose weight and he had lost 30 pounds along with exercising regularly His PCP had started treatment of his diabetes with a combination treatment using Victoza and Xigduo He was having poor control of his diabetes with A1c 9.2 in 2/17 because of noncompliance with on measures of self-care and medications and having symptomatic hyperglycemia   Recent history:    Non-insulin hypoglycemic drugs : Mounjaro 0 mg weekly, Synjardy XR 12.06/998, 2 tablets daily  His A1c is last 6.1 done in 5/24 compared to 5.8  A1c generally lower than expected for actual blood sugars   Current management, blood sugar patterns and problems identified:  He had been on Mounjaro since about 10/2021 but for the last 2 months has not been able to get any supplies at the pharmacy With this his blood sugars have been higher and he thinks he is having increased thirst and urination However he lost his meter and has only a couple of readings recently He finally got his Mounjaro yesterday and took 5 mg, previously was up to 15 mg His fasting reading was 249 today and after breakfast 161, otherwise recently blood sugars have been about 180-230 with  a total of 5 readings in the last month He has a new job and says he has not found the time for exercise With [facility] he was able to control portions and recently without this he has gained some weight   Side effects from medications have been:  none  His dinnertime is variable in the evenings, usually first meal of the day is about 12 noon  Glucose monitoring:  Less than once a day on an average      Glucometer: One touch Verio   Blood Glucose readings from meter review as above  Previously:   PRE-MEAL Mornings Lunch Dinner Bedtime Overall  Glucose range: 104-195   98-191 98-195  Mean/median: 135   145 129   POST-MEAL PC Breakfast PC Lunch PC Dinner  Glucose range:   102-140  Mean/median:   116      Self-care: He has been consistent with diet usually   Meals: 3 meals per day. Breakfast is protein shake or oatmeal, lunch is frequently salad or sandwich. Dinner usually sandwich         Dietician visit, most recent: 10/15            Weight history: was 380 in 2014  Wt Readings from Last 3 Encounters:  08/20/22 295 lb (133.8 kg)  06/19/22 293 lb (132.9 kg)  02/22/22 288 lb (130.6 kg)  Glycemic control:   Lab Results  Component Value Date   HGBA1C 6.1 06/19/2022   HGBA1C 5.8 (A) 02/22/2022   HGBA1C 5.7 (A) 10/19/2021   Lab Results  Component Value Date   MICROALBUR 1.0 06/19/2022   LDLCALC 41 01/30/2022   CREATININE 0.89 06/19/2022   Lab Results  Component Value Date   FRUCTOSAMINE 357 (H) 02/05/2018   FRUCTOSAMINE 235 05/08/2015     Other active problems: See review of systems   Allergies as of 08/20/2022   No Known Allergies      Medication List        Accurate as of August 20, 2022  9:45 AM. If you have any questions, ask your nurse or doctor.          amLODipine 10 MG tablet Commonly known as: NORVASC Take 1 tablet (10 mg total) by mouth daily.   indapamide 1.25 MG tablet Commonly known as: LOZOL Take 1 tablet (1.25 mg total) by  mouth daily.   irbesartan 150 MG tablet Commonly known as: AVAPRO TAKE 1 TABLET(150 MG) BY MOUTH DAILY   levocetirizine 5 MG tablet Commonly known as: XYZAL Take 1 tablet (5 mg total) by mouth every evening.   montelukast 10 MG tablet Commonly known as: SINGULAIR Take 1 tablet (10 mg total) by mouth at bedtime.   OneTouch Delica Lancets 33G Misc Use to check blood sugar once a day   OneTouch Verio test strip Generic drug: glucose blood USE TO TEST BLOOD SUGAR ONCE DAILY AS DIRECTED   OneTouch Verio w/Device Kit Use to check blood sugar once a day   Ozempic (2 MG/DOSE) 8 MG/3ML Sopn Generic drug: Semaglutide (2 MG/DOSE) Inject 2mg  weekly   rosuvastatin 20 MG tablet Commonly known as: CRESTOR TAKE 1 TABLET(20 MG) BY MOUTH DAILY   Synjardy XR 12.06-998 MG Tb24 Generic drug: Empagliflozin-metFORMIN HCl ER TAKE 2 TABLETS BY MOUTH DAILY WITH BREAKFAST   tamsulosin 0.4 MG Caps capsule Commonly known as: FLOMAX Take 1 capsule (0.4 mg total) by mouth daily after supper.   tirzepatide 5 MG/0.5ML Pen Commonly known as: MOUNJARO Inject 5 mg into the skin once a week.        Allergies: No Known Allergies  Past Medical History:  Diagnosis Date   Diabetes mellitus without complication (HCC)    GERD (gastroesophageal reflux disease)    Obesity     Past Surgical History:  Procedure Laterality Date   GROWTH PLATE SURGERY Bilateral 1980s   WISDOM TOOTH EXTRACTION      Family History  Problem Relation Age of Onset   Heart disease Mother    Hypertension Mother    Early death Neg Hx    Alcohol abuse Neg Hx    Birth defects Neg Hx    Cancer Neg Hx    COPD Neg Hx    Drug abuse Neg Hx    Hyperlipidemia Neg Hx    Kidney disease Neg Hx    Stroke Neg Hx    Thyroid disease Neg Hx    Diabetes Maternal Grandfather     Social History:  reports that he has never smoked. He has never used smokeless tobacco. He reports that he does not drink alcohol and does not use  drugs.    Review of Systems         Lipids: He has been prescribed Crestor 20 mg daily by his PCP, labs as follows      Lab Results  Component Value Date   CHOL 103  01/30/2022   HDL 39.40 01/30/2022   LDLCALC 41 01/30/2022   LDLDIRECT 141.3 01/12/2014   TRIG 113.0 01/30/2022   CHOLHDL 3 01/30/2022                  Thyroid: He was found to have left-sided thyroid nodule on initial exam and this was benign on biopsy On exam he has had a 2.5-3 cm nodule on the left side  Lab Results  Component Value Date   TSH 0.72 01/30/2022    HYPERTENSION: He is still on amlodipine 5 mg and Avapro 150 mg daily Previously blood pressure was high because of not tolerating HCTZ which caused excessive urination Indapamide caused blood pressure to be as low as 90  He has been checking blood pressure at work periodically    BP Readings from Last 3 Encounters:  08/20/22 120/80  06/19/22 (!) 148/94  02/22/22 118/76     LABS:  No visits with results within 1 Week(s) from this visit.  Latest known visit with results is:  Office Visit on 06/19/2022  Component Date Value Ref Range Status   Microalb, Ur 06/19/2022 1.0  0.0 - 1.9 mg/dL Final   Creatinine,U 43/32/9518 185.4  mg/dL Final   Microalb Creat Ratio 06/19/2022 0.6  0.0 - 30.0 mg/g Final   Total Bilirubin 06/19/2022 1.2  0.2 - 1.2 mg/dL Final   Bilirubin, Direct 06/19/2022 0.2  0.0 - 0.3 mg/dL Final   Alkaline Phosphatase 06/19/2022 63  39 - 117 U/L Final   AST 06/19/2022 22  0 - 37 U/L Final   ALT 06/19/2022 23  0 - 53 U/L Final   Total Protein 06/19/2022 7.8  6.0 - 8.3 g/dL Final   Albumin 84/16/6063 4.3  3.5 - 5.2 g/dL Final   Color, Urine 01/60/1093 YELLOW  Yellow;Lt. Yellow;Straw;Dark Yellow;Amber;Green;Red;Brown Final   APPearance 06/19/2022 CLEAR  Clear;Turbid;Slightly Cloudy;Cloudy Final   Specific Gravity, Urine 06/19/2022 >=1.030 (A)  1.000 - 1.030 Final   pH 06/19/2022 5.5  5.0 - 8.0 Final   Total Protein, Urine  06/19/2022 NEGATIVE  Negative Final   Urine Glucose 06/19/2022 NEGATIVE  Negative Final   Ketones, ur 06/19/2022 NEGATIVE  Negative Final   Bilirubin Urine 06/19/2022 NEGATIVE  Negative Final   Hgb urine dipstick 06/19/2022 TRACE-INTACT (A)  Negative Final   Urobilinogen, UA 06/19/2022 0.2  0.0 - 1.0 Final   Leukocytes,Ua 06/19/2022 NEGATIVE  Negative Final   Nitrite 06/19/2022 NEGATIVE  Negative Final   WBC, UA 06/19/2022 0-2/hpf  0-2/hpf Final   RBC / HPF 06/19/2022 0-2/hpf  0-2/hpf Final   Mucus, UA 06/19/2022 Presence of (A)  None Final   Squamous Epithelial / HPF 06/19/2022 Rare(0-4/hpf)  Rare(0-4/hpf) Final   Hgb A1c MFr Bld 06/19/2022 6.1  4.6 - 6.5 % Final   Glycemic Control Guidelines for People with Diabetes:Non Diabetic:  <6%Goal of Therapy: <7%Additional Action Suggested:  >8%    WBC 06/19/2022 6.1  4.0 - 10.5 K/uL Final   RBC 06/19/2022 4.99  4.22 - 5.81 Mil/uL Final   Hemoglobin 06/19/2022 16.3  13.0 - 17.0 g/dL Final   HCT 23/55/7322 48.1  39.0 - 52.0 % Final   MCV 06/19/2022 96.5  78.0 - 100.0 fl Final   MCHC 06/19/2022 33.9  30.0 - 36.0 g/dL Final   RDW 02/54/2706 14.5  11.5 - 15.5 % Final   Platelets 06/19/2022 320.0  150.0 - 400.0 K/uL Final   Neutrophils Relative % 06/19/2022 62.9  43.0 - 77.0 % Final  Lymphocytes Relative 06/19/2022 24.0  12.0 - 46.0 % Final   Monocytes Relative 06/19/2022 10.3  3.0 - 12.0 % Final   Eosinophils Relative 06/19/2022 1.9  0.0 - 5.0 % Final   Basophils Relative 06/19/2022 0.9  0.0 - 3.0 % Final   Neutro Abs 06/19/2022 3.9  1.4 - 7.7 K/uL Final   Lymphs Abs 06/19/2022 1.5  0.7 - 4.0 K/uL Final   Monocytes Absolute 06/19/2022 0.6  0.1 - 1.0 K/uL Final   Eosinophils Absolute 06/19/2022 0.1  0.0 - 0.7 K/uL Final   Basophils Absolute 06/19/2022 0.1  0.0 - 0.1 K/uL Final   Sodium 06/19/2022 138  135 - 145 mEq/L Final   Potassium 06/19/2022 3.9  3.5 - 5.1 mEq/L Final   Chloride 06/19/2022 103  96 - 112 mEq/L Final   CO2 06/19/2022 28  19  - 32 mEq/L Final   Glucose, Bld 06/19/2022 125 (H)  70 - 99 mg/dL Final   BUN 16/11/9602 9  6 - 23 mg/dL Final   Creatinine, Ser 06/19/2022 0.89  0.40 - 1.50 mg/dL Final   GFR 54/10/8117 100.27  >60.00 mL/min Final   Calculated using the CKD-EPI Creatinine Equation (2021)   Calcium 06/19/2022 9.9  8.4 - 10.5 mg/dL Final    Physical Examination:  BP 120/80 (BP Location: Left Arm, Patient Position: Sitting, Cuff Size: Large)   Pulse 94   Ht 6' (1.829 m)   Wt 295 lb (133.8 kg)   SpO2 97%   BMI 40.01 kg/m     ASSESSMENT/PLAN:  Diabetes type 2 with obesity  See history of present illness for detailed discussion of his current management, blood sugar patterns and problems identified  A1c is last 6.1 lower than expected at 5.8 %  Current regimen is Mounjaro and Synjardy  His blood sugars have gone up significantly with not being able to get  Gained some weight back  He is asking about going back to Ozempic but likely long-term will do better with Mounjaro and hopefully will get his supplies on time in the future Currently asymptomatic from high sugars and blood sugars not being documented enough but appear to be in the low 200 range recently  Recommendations: He will go up to 10 mg Mounjaro on his next prescription and subsequently 15 mg Co-pay card given Start Amaryl 2 mg at dinnertime and if morning sugars come down below 120 will stop this Restart exercise program Consistent diet Regular follow-up   There are no Patient Instructions on file for this visit.   Reather Littler 08/20/2022, 9:45 AM   Note: This office note was prepared with Dragon voice recognition system technology. Any transcriptional errors that result from this process are unintentional.

## 2022-08-20 NOTE — Patient Instructions (Signed)
 Check blood sugars on waking up 2-3 days a week  Also check blood sugars about 2 hours after meals and do this after different meals by rotation  Recommended blood sugar levels on waking up are 90-130 and about 2 hours after meal is 130-160  Please bring your blood sugar monitor to each visit, thank you  Restart exercise

## 2022-09-04 ENCOUNTER — Encounter (INDEPENDENT_AMBULATORY_CARE_PROVIDER_SITE_OTHER): Payer: Self-pay

## 2022-09-08 ENCOUNTER — Other Ambulatory Visit: Payer: Self-pay

## 2022-09-08 DIAGNOSIS — E1169 Type 2 diabetes mellitus with other specified complication: Secondary | ICD-10-CM

## 2022-09-08 MED ORDER — SYNJARDY XR 12.5-1000 MG PO TB24: 2.0000 | ORAL_TABLET | Freq: Every day | ORAL | 3 refills | Status: DC

## 2022-09-13 ENCOUNTER — Other Ambulatory Visit: Payer: Self-pay | Admitting: Internal Medicine

## 2022-09-13 DIAGNOSIS — J301 Allergic rhinitis due to pollen: Secondary | ICD-10-CM

## 2022-09-13 DIAGNOSIS — I10 Essential (primary) hypertension: Secondary | ICD-10-CM

## 2022-09-17 ENCOUNTER — Other Ambulatory Visit (HOSPITAL_COMMUNITY): Payer: Self-pay

## 2022-09-25 ENCOUNTER — Ambulatory Visit: Payer: BC Managed Care – PPO | Admitting: Internal Medicine

## 2022-09-25 ENCOUNTER — Encounter: Payer: Self-pay | Admitting: Internal Medicine

## 2022-09-25 VITALS — BP 118/72 | HR 94 | Temp 98.0°F | Ht 72.0 in | Wt 304.0 lb

## 2022-09-25 DIAGNOSIS — I10 Essential (primary) hypertension: Secondary | ICD-10-CM

## 2022-09-25 DIAGNOSIS — Z23 Encounter for immunization: Secondary | ICD-10-CM

## 2022-09-25 DIAGNOSIS — E785 Hyperlipidemia, unspecified: Secondary | ICD-10-CM

## 2022-09-25 DIAGNOSIS — E139 Other specified diabetes mellitus without complications: Secondary | ICD-10-CM | POA: Diagnosis not present

## 2022-09-25 LAB — BASIC METABOLIC PANEL
BUN: 9 mg/dL (ref 6–23)
CO2: 29 mEq/L (ref 19–32)
Calcium: 10.1 mg/dL (ref 8.4–10.5)
Chloride: 103 mEq/L (ref 96–112)
Creatinine, Ser: 0.87 mg/dL (ref 0.40–1.50)
GFR: 100.77 mL/min (ref >60.00)
Glucose, Bld: 115 mg/dL — ABNORMAL HIGH (ref 70–99)
Potassium: 3.8 mEq/L (ref 3.5–5.1)
Sodium: 139 mEq/L (ref 135–145)

## 2022-09-25 LAB — HEMOGLOBIN A1C: Hgb A1c MFr Bld: 6.7 % — ABNORMAL HIGH (ref 4.6–6.5)

## 2022-09-25 MED ORDER — ROSUVASTATIN CALCIUM 20 MG PO TABS
20.0000 mg | ORAL_TABLET | Freq: Every day | ORAL | 1 refills | Status: DC
Start: 2022-09-25 — End: 2022-12-18

## 2022-09-25 NOTE — Patient Instructions (Signed)
Hypertension, Adult High blood pressure (hypertension) is when the force of blood pumping through the arteries is too strong. The arteries are the blood vessels that carry blood from the heart throughout the body. Hypertension forces the heart to work harder to pump blood and may cause arteries to become narrow or stiff. Untreated or uncontrolled hypertension can lead to a heart attack, heart failure, a stroke, kidney disease, and other problems. A blood pressure reading consists of a higher number over a lower number. Ideally, your blood pressure should be below 120/80. The first ("top") number is called the systolic pressure. It is a measure of the pressure in your arteries as your heart beats. The second ("bottom") number is called the diastolic pressure. It is a measure of the pressure in your arteries as the heart relaxes. What are the causes? The exact cause of this condition is not known. There are some conditions that result in high blood pressure. What increases the risk? Certain factors may make you more likely to develop high blood pressure. Some of these risk factors are under your control, including: Smoking. Not getting enough exercise or physical activity. Being overweight. Having too much fat, sugar, calories, or salt (sodium) in your diet. Drinking too much alcohol. Other risk factors include: Having a personal history of heart disease, diabetes, high cholesterol, or kidney disease. Stress. Having a family history of high blood pressure and high cholesterol. Having obstructive sleep apnea. Age. The risk increases with age. What are the signs or symptoms? High blood pressure may not cause symptoms. Very high blood pressure (hypertensive crisis) may cause: Headache. Fast or irregular heartbeats (palpitations). Shortness of breath. Nosebleed. Nausea and vomiting. Vision changes. Severe chest pain, dizziness, and seizures. How is this diagnosed? This condition is diagnosed by  measuring your blood pressure while you are seated, with your arm resting on a flat surface, your legs uncrossed, and your feet flat on the floor. The cuff of the blood pressure monitor will be placed directly against the skin of your upper arm at the level of your heart. Blood pressure should be measured at least twice using the same arm. Certain conditions can cause a difference in blood pressure between your right and left arms. If you have a high blood pressure reading during one visit or you have normal blood pressure with other risk factors, you may be asked to: Return on a different day to have your blood pressure checked again. Monitor your blood pressure at home for 1 week or longer. If you are diagnosed with hypertension, you may have other blood or imaging tests to help your health care provider understand your overall risk for other conditions. How is this treated? This condition is treated by making healthy lifestyle changes, such as eating healthy foods, exercising more, and reducing your alcohol intake. You may be referred for counseling on a healthy diet and physical activity. Your health care provider may prescribe medicine if lifestyle changes are not enough to get your blood pressure under control and if: Your systolic blood pressure is above 130. Your diastolic blood pressure is above 80. Your personal target blood pressure may vary depending on your medical conditions, your age, and other factors. Follow these instructions at home: Eating and drinking  Eat a diet that is high in fiber and potassium, and low in sodium, added sugar, and fat. An example of this eating plan is called the DASH diet. DASH stands for Dietary Approaches to Stop Hypertension. To eat this way: Eat   plenty of fresh fruits and vegetables. Try to fill one half of your plate at each meal with fruits and vegetables. Eat whole grains, such as whole-wheat pasta, brown rice, or whole-grain bread. Fill about one  fourth of your plate with whole grains. Eat or drink low-fat dairy products, such as skim milk or low-fat yogurt. Avoid fatty cuts of meat, processed or cured meats, and poultry with skin. Fill about one fourth of your plate with lean proteins, such as fish, chicken without skin, beans, eggs, or tofu. Avoid pre-made and processed foods. These tend to be higher in sodium, added sugar, and fat. Reduce your daily sodium intake. Many people with hypertension should eat less than 1,500 mg of sodium a day. Do not drink alcohol if: Your health care provider tells you not to drink. You are pregnant, may be pregnant, or are planning to become pregnant. If you drink alcohol: Limit how much you have to: 0-1 drink a day for women. 0-2 drinks a day for men. Know how much alcohol is in your drink. In the [place] one drink equals one 12 oz bottle of beer (355 mL), one 5 oz glass of wine (148 mL), or one 1 oz glass of hard liquor (44 mL). Lifestyle  Work with your health care provider to maintain a healthy body weight or to lose weight. Ask what an ideal weight is for you. Get at least 30 minutes of exercise that causes your heart to beat faster (aerobic exercise) most days of the week. Activities may include walking, swimming, or biking. Include exercise to strengthen your muscles (resistance exercise), such as Pilates or lifting weights, as part of your weekly exercise routine. Try to do these types of exercises for 30 minutes at least 3 days a week. Do not use any products that contain nicotine or tobacco. These products include cigarettes, chewing tobacco, and vaping devices, such as e-cigarettes. If you need help quitting, ask your health care provider. Monitor your blood pressure at home as told by your health care provider. Keep all follow-up visits. This is important. Medicines Take over-the-counter and prescription medicines only as told by your health care provider. Follow directions carefully. Blood  pressure medicines must be taken as prescribed. Do not skip doses of blood pressure medicine. Doing this puts you at risk for problems and can make the medicine less effective. Ask your health care provider about side effects or reactions to medicines that you should watch for. Contact a health care provider if you: Think you are having a reaction to a medicine you are taking. Have headaches that keep coming back (recurring). Feel dizzy. Have swelling in your ankles. Have trouble with your vision. Get help right away if you: Develop a severe headache or confusion. Have unusual weakness or numbness. Feel faint. Have severe pain in your chest or abdomen. Vomit repeatedly. Have trouble breathing. These symptoms may be an emergency. Get help right away. Call 911. Do not wait to see if the symptoms will go away. Do not drive yourself to the hospital. Summary Hypertension is when the force of blood pumping through your arteries is too strong. If this condition is not controlled, it may put you at risk for serious complications. Your personal target blood pressure may vary depending on your medical conditions, your age, and other factors. For most people, a normal blood pressure is less than 120/80. Hypertension is treated with lifestyle changes, medicines, or a combination of both. Lifestyle changes include losing weight, eating a healthy,   low-sodium diet, exercising more, and limiting alcohol. This information is not intended to replace advice given to you by your health care provider. Make sure you discuss any questions you have with your health care provider. Document Revised: 11/28/2020 Document Reviewed: 11/28/2020 Elsevier Patient Education  2024 [employer]  

## 2022-09-25 NOTE — Progress Notes (Signed)
Subjective:  Patient ID: Tyler Benton, male    DOB: 1970/12/03  Age: 52 y.o. MRN: [id number]  CC: Hypertension and Diabetes   HPI Tyler Benton presents for f/up ----  Discussed the use of AI scribe software for clinical note transcription with the patient, who gave verbal consent to proceed.  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 He also denies any symptoms of chest pain, shortness of breath, dizziness, or lightheadedness.  The patient has a history of diabetes and recently experienced a lapse in medication due to a transition between jobs and a delay in Training and development officer. During this period, he reports his blood sugar levels occasionally reached 150-160, and once exceeded 200 after consuming cake and ice cream at a birthday party. However, he has since resumed his medication and reports his blood sugar levels have been well-regulated.  The patient maintains an active lifestyle, walking two to three miles daily without experiencing chest pain or shortness of breath. He does, however, report having "fireman's knees," a colloquial term often used to describe knee pain or discomfort.       Outpatient Medications Prior to Visit  Medication Sig Dispense Refill   Blood Glucose Monitoring Suppl (ONETOUCH VERIO) w/Device KIT Use to check blood sugar once a day 1 kit 0   Empagliflozin-metFORMIN HCl ER (SYNJARDY XR) 12.06-998 MG TB24 Take 2 tablets by mouth daily with breakfast. 60 tablet 3   glucose blood (ONETOUCH VERIO) test strip USE TO TEST BLOOD SUGAR ONCE DAILY AS DIRECTED 100 strip 2   irbesartan (AVAPRO) 150 MG tablet TAKE 1 TABLET(150 MG) BY MOUTH DAILY 90 tablet 0   levocetirizine (XYZAL) 5 MG tablet Take 1 tablet (5 mg total) by mouth every evening. 90 tablet 0   montelukast (SINGULAIR) 10 MG tablet Take 1 tablet (10 mg total) by  mouth at bedtime. Keep Aug appt for future refills 90 tablet 0   OneTouch Delica Lancets 33G MISC Use to check blood sugar once a day 100 each 2   tamsulosin (FLOMAX) 0.4 MG CAPS capsule Take 1 capsule (0.4 mg total) by mouth daily after supper. 10 capsule 1   tirzepatide (MOUNJARO) 10 MG/0.5ML Pen Inject 10 mg into the skin once a week. 3 mL 1   amLODipine (NORVASC) 10 MG tablet Take 1 tablet (10 mg total) by mouth daily. Must keep 09/25/22 appt for future refills 30 tablet 0   glimepiride (AMARYL) 2 MG tablet Take 1 tablet (2 mg total) by mouth daily before supper. 30 tablet 0   indapamide (LOZOL) 1.25 MG tablet Take 1 tablet (1.25 mg total) by mouth daily. Must keep 09/25/22 appt for future refills 30 tablet 0   rosuvastatin (CRESTOR) 20 MG tablet TAKE 1 TABLET(20 MG) BY MOUTH DAILY 90 tablet 0   No facility-administered medications prior to visit.    ROS Review of Systems  Constitutional:  Negative for appetite change, fatigue and fever.  HENT: Negative.    Eyes: Negative.   Respiratory:  Negative for cough, chest tightness, shortness of breath and wheezing.   Cardiovascular:  Negative for chest pain, palpitations and leg swelling.  Gastrointestinal:  Negative for abdominal pain, constipation, diarrhea, nausea and vomiting.  Genitourinary: Negative.  Negative for difficulty urinating.  Musculoskeletal:  Positive for back pain.  Skin: Negative.  Negative for color change and pallor.  Neurological: Negative.  Hematological:  Negative for adenopathy. Does not bruise/bleed easily.  Psychiatric/Behavioral: Negative.      Objective:  BP 118/72 (BP Location: Right Arm, Patient Position: Sitting, Cuff Size: Large)   Pulse 94   Temp 98 F (36.7 C) (Oral)   Ht 6' (1.829 m)   Wt (!) 304 lb (137.9 kg)   SpO2 96%   BMI 41.23 kg/m   BP Readings from Last 3 Encounters:  09/25/22 118/72  08/20/22 120/80  06/19/22 (!) 148/94    Wt Readings from Last 3 Encounters:  09/25/22 (!) 304 lb  (137.9 kg)  08/20/22 295 lb (133.8 kg)  06/19/22 293 lb (132.9 kg)    Physical Exam Vitals reviewed.  Constitutional:      Appearance: Normal appearance.  HENT:     Nose: Nose normal.     Mouth/Throat:     Mouth: Mucous membranes are moist.  Eyes:     General: No scleral icterus.    Conjunctiva/sclera: Conjunctivae normal.  Cardiovascular:     Rate and Rhythm: Normal rate and regular rhythm.     Heart sounds: No murmur heard.    No friction rub. No gallop.  Pulmonary:     Effort: Pulmonary effort is normal.     Breath sounds: No wheezing, rhonchi or rales.  Abdominal:     General: Abdomen is protuberant. Bowel sounds are normal. There is no distension.     Palpations: Abdomen is soft. There is no fluid wave, hepatomegaly, splenomegaly or mass.     Tenderness: There is no abdominal tenderness. There is no guarding.  Musculoskeletal:        General: Normal range of motion.     Cervical back: Neck supple.     Right lower leg: No edema.     Left lower leg: No edema.  Lymphadenopathy:     Cervical: No cervical adenopathy.  Skin:    General: Skin is warm and dry.     Coloration: Skin is not pale.  Neurological:     General: No focal deficit present.     Mental Status: He is alert. Mental status is at baseline.  Psychiatric:        Mood and Affect: Mood normal.     Lab Results  Component Value Date   WBC 6.1 06/19/2022   HGB 16.3 06/19/2022   HCT 48.1 06/19/2022   PLT 320.0 06/19/2022   GLUCOSE 115 (H) 09/25/2022   CHOL 103 01/30/2022   TRIG 113.0 01/30/2022   HDL 39.40 01/30/2022   LDLDIRECT 141.3 01/12/2014   LDLCALC 41 01/30/2022   ALT 23 06/19/2022   AST 22 06/19/2022   NA 139 09/25/2022   K 3.8 09/25/2022   CL 103 09/25/2022   CREATININE 0.87 09/25/2022   BUN 9 09/25/2022   CO2 29 09/25/2022   TSH 0.72 01/30/2022   PSA 0.33 01/30/2022   HGBA1C 6.7 (H) 09/25/2022   MICROALBUR 1.0 06/19/2022    CT Chest Wo Contrast  Result Date: 02/28/2022 CLINICAL  DATA:  Abnormal x-ray. Lung opacities/opacities. Evaluate for lung nodule. Former smoker. EXAM: CT CHEST WITHOUT CONTRAST TECHNIQUE: Multidetector CT imaging of the chest was performed following the standard protocol without IV contrast. RADIATION DOSE REDUCTION: This exam was performed according to the departmental dose-optimization program which includes automated exposure control, adjustment of the mA and/or kV according to patient size and/or use of iterative reconstruction technique. COMPARISON:  Chest two views 01/30/2022, frontal chest radiograph 11/20/2016 FINDINGS: Cardiovascular: Heart size is normal. No pericardial  effusion. No thoracic aortic aneurysm. Mediastinum/Nodes: No axillary, mediastinal, or hilar pathologically enlarged lymph nodes by CT criteria. The esophagus follows a normal course of normal caliber. There is enlargement of the left thyroid lobe. Mild calcifications seen inferiorly. No discrete thyroid nodule is seen. This is consistent with a left thyroid goiter. This was the cause for mild impression on the left aspect of the trachea on recent radiograph. Lungs/Pleura: The central airways are patent. Small superior extension of mesenteric fat into the posteroinferior right hemithorax, a Bochdalek hernia. Mild curvilinear scarring or possibly subsegmental atelectasis within the anterior right upper lobe. Mild curvilinear and ground-glass subsegmental atelectasis versus scarring within the posterior bilateral lower lobes. 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 Mild curvilinear subsegmental atelectasis versus scarring within the lingula. No suspicious pulmonary nodule is seen. No pleural effusion or pneumothorax. Upper Abdomen: Small 17 mm soft tissue likely splenule inferior and anterior to the spleen abutting the anterolateral left upper abdominal quadrant  pleura (axial series 2, images 140-145/145). Musculoskeletal: On the prior 01/30/2022 lateral chest radiograph, at the approximate inferior T7 level there was an approximately 2.3 cm dense radiopacity overlying the posteroinferior T7 vertebral body. 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 There is also chronic cortical thickening and peripheral osteophytosis at the left eighth rib. There is similar but more mild endplate osteophytosis and costovertebral joint space narrowing and peripheral osteophytosis of the right T7-8 disc level at the junction with the right eighth rib. There is leads anterior bridging osteophyte at the anterior left T11-12 level. Mild-to-moderate disc space narrowing of the upper thoracic spine. Incidental note of moderate right and mild-to-moderate left gynecomastia. IMPRESSION: 1. 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 There is also chronic benign cortical thickening and peripheral osteophytosis at the left eighth rib. 2. No suspicious pulmonary nodule is seen. Electronically Signed   By: Neita Garnet M.D.   On: 02/28/2022 12:01    Assessment & Plan:  Hyperlipidemia LDL goal <100 -     Rosuvastatin Calcium; Take 1 tablet (20 mg total) by mouth daily. TAKE 1 TABLET(20 MG) BY MOUTH DAILY  Dispense: 90 tablet; Refill: 1  Essential hypertension- His blood pressure is well-controlled. -     Basic metabolic panel; Future  Diabetes 1.5, managed as type 1 (HCC)- His blood sugar is well-controlled. -     Hemoglobin A1c; Future  Other orders -     Tdap vaccine greater than or equal to 7yo IM     Follow-up: Return in about 6 months (around 03/28/2023).  Sanda Linger, MD

## 2022-10-14 ENCOUNTER — Telehealth: Payer: Self-pay | Admitting: Endocrinology

## 2022-10-14 NOTE — Telephone Encounter (Signed)
Patient needs rx for test strips/ OneTouch VERIO, called into [facility] on [address] [place] [employer]

## 2022-10-15 ENCOUNTER — Other Ambulatory Visit: Payer: Self-pay

## 2022-10-15 DIAGNOSIS — E1169 Type 2 diabetes mellitus with other specified complication: Secondary | ICD-10-CM

## 2022-10-15 MED ORDER — ONETOUCH VERIO VI STRP
ORAL_STRIP | 2 refills | Status: AC
Start: 2022-10-15 — End: ?

## 2022-10-15 NOTE — Telephone Encounter (Signed)
One touch verio test strips as been sent to [facility]

## 2022-10-25 DIAGNOSIS — G4489 Other headache syndrome: Secondary | ICD-10-CM | POA: Diagnosis not present

## 2022-10-25 DIAGNOSIS — R059 Cough, unspecified: Secondary | ICD-10-CM | POA: Diagnosis not present

## 2022-10-25 DIAGNOSIS — J209 Acute bronchitis, unspecified: Secondary | ICD-10-CM | POA: Diagnosis not present

## 2022-10-25 DIAGNOSIS — J019 Acute sinusitis, unspecified: Secondary | ICD-10-CM | POA: Diagnosis not present

## 2022-10-25 LAB — HM DIABETES EYE EXAM

## 2022-11-06 ENCOUNTER — Ambulatory Visit: Payer: BC Managed Care – PPO | Admitting: Internal Medicine

## 2022-11-19 ENCOUNTER — Other Ambulatory Visit: Payer: Self-pay

## 2022-11-19 DIAGNOSIS — E1165 Type 2 diabetes mellitus with hyperglycemia: Secondary | ICD-10-CM

## 2022-11-19 MED ORDER — TIRZEPATIDE 10 MG/0.5ML ~~LOC~~ SOAJ: 10.0000 mg | SUBCUTANEOUS | 1 refills | Status: DC

## 2022-11-28 ENCOUNTER — Other Ambulatory Visit: Payer: Self-pay | Admitting: Medical Genetics

## 2022-11-28 ENCOUNTER — Telehealth: Payer: Self-pay

## 2022-11-28 ENCOUNTER — Other Ambulatory Visit: Payer: Self-pay

## 2022-11-28 DIAGNOSIS — Z006 Encounter for examination for normal comparison and control in clinical research program: Secondary | ICD-10-CM

## 2022-11-28 DIAGNOSIS — E1165 Type 2 diabetes mellitus with hyperglycemia: Secondary | ICD-10-CM

## 2022-11-28 MED ORDER — TIRZEPATIDE 10 MG/0.5ML ~~LOC~~ SOAJ: 10.0000 mg | SUBCUTANEOUS | 1 refills | Status: DC

## 2022-11-28 NOTE — Telephone Encounter (Signed)
Mounjaro refill request complete

## 2022-12-06 DIAGNOSIS — J209 Acute bronchitis, unspecified: Secondary | ICD-10-CM | POA: Diagnosis not present

## 2022-12-06 DIAGNOSIS — Z0000 Encounter for general adult medical examination without abnormal findings: Secondary | ICD-10-CM | POA: Diagnosis not present

## 2022-12-06 DIAGNOSIS — Z0279 Encounter for issue of other medical certificate: Secondary | ICD-10-CM | POA: Diagnosis not present

## 2022-12-18 ENCOUNTER — Encounter: Payer: Self-pay | Admitting: Endocrinology

## 2022-12-18 ENCOUNTER — Ambulatory Visit (INDEPENDENT_AMBULATORY_CARE_PROVIDER_SITE_OTHER): Payer: BC Managed Care – PPO | Admitting: Endocrinology

## 2022-12-18 VITALS — BP 136/80 | HR 87 | Resp 20 | Ht 72.0 in | Wt 297.4 lb

## 2022-12-18 DIAGNOSIS — E1169 Type 2 diabetes mellitus with other specified complication: Secondary | ICD-10-CM | POA: Diagnosis not present

## 2022-12-18 DIAGNOSIS — E041 Nontoxic single thyroid nodule: Secondary | ICD-10-CM | POA: Diagnosis not present

## 2022-12-18 DIAGNOSIS — Z7985 Long-term (current) use of injectable non-insulin antidiabetic drugs: Secondary | ICD-10-CM

## 2022-12-18 DIAGNOSIS — E669 Obesity, unspecified: Secondary | ICD-10-CM | POA: Diagnosis not present

## 2022-12-18 DIAGNOSIS — E785 Hyperlipidemia, unspecified: Secondary | ICD-10-CM | POA: Diagnosis not present

## 2022-12-18 DIAGNOSIS — E119 Type 2 diabetes mellitus without complications: Secondary | ICD-10-CM

## 2022-12-18 LAB — POCT GLYCOSYLATED HEMOGLOBIN (HGB A1C): Hemoglobin A1C: 5.7 % — AB (ref 4.0–5.6)

## 2022-12-18 MED ORDER — ROSUVASTATIN CALCIUM 20 MG PO TABS
20.0000 mg | ORAL_TABLET | Freq: Every day | ORAL | 3 refills | Status: DC
Start: 2022-12-18 — End: 2023-08-06

## 2022-12-18 MED ORDER — SYNJARDY XR 12.5-1000 MG PO TB24
2.0000 | ORAL_TABLET | Freq: Every day | ORAL | 3 refills | Status: DC
Start: 2022-12-18 — End: 2023-05-05

## 2022-12-18 MED ORDER — TIRZEPATIDE 15 MG/0.5ML ~~LOC~~ SOAJ: 15.0000 mg | SUBCUTANEOUS | 4 refills | Status: DC

## 2022-12-18 NOTE — Progress Notes (Signed)
Outpatient Endocrinology Note [place] Deontrae Drinkard, MD  12/18/22  Patient's Name: Tyler Benton    DOB: 23-Jun-1970    MRN: [id number]                                                    REASON OF VISIT: Follow up of type 2 diabetes mellitus  PCP: Etta Grandchild, MD  HISTORY OF PRESENT ILLNESS:   JEANLUC KELLEN is a 52 y.o. old male with past medical history listed below, is here for follow up for type 2 diabetes mellitus and thyroid nodule.   Pertinent Diabetes History: _Diagnosed as Diabetes Mellitus type 2  in 2014.  Chronic Diabetes Complications : Retinopathy: no. Last ophthalmology exam was done on annually, following with ophthalmology regularly.  Nephropathy: no, on ACE/ARB / irbesartan Peripheral neuropathy: no Coronary artery disease: no Stroke: no  Relevant comorbidities and cardiovascular risk factors: Obesity: yes Body mass index is 40.33 kg/m.  Hypertension: Yes  Hyperlipidemia : Yes, on statin   Current / Home Diabetic regimen includes: Synjardy XR 12.06/998 mg 2 tab daily in the morning.  Mounjaro 10 mg weekly.   Prior diabetic medications: Victoza and Xigduo.  Glycemic data:   [employer] from October 30 to December 18, 2022 reviewed.  He has been checking occasionally.  In the last 2 weeks he had checked 3 times blood sugar 128, 136, 150.  Hypoglycemia: Patient has no hypoglycemic episodes. Patient has hypoglycemia awareness.  Factors modifying glucose control: 1.  Diabetic diet assessment: 3 meals a day.   2.  Staying active or exercising: walking.  3.  Medication compliance: compliant all of the time.  Multiple thyroid nodules: Patient has multiple thyroid nodules, ultrasound in September 2015 showed bilateral thyroid nodules, dominant nodule in the lower pole of left lobe measuring 2.5 cm.  Patient had repeat ultrasound thyroid in March 2017 showed increased size of the left-sided thyroid nodule measuring 2.9 cm.  He  underwent FNA of left thyroid nodule on April 25, 2015, cytology benign, colloid type. -No evidence of thyroid disorder/thyroid cancer. -He has normal thyroid function test, euthyroid and not on thyroid medication.   Interval history  Patient's diabetes regimen and glucose data as reviewed above.  He has no complaints.  Today hemoglobin A1c 5.7%.  He has noticed increased sensation on swallowing of the food into the neck area.  However no difficulty swallowing.  Has not noticed increasing size of thyroid nodule.  REVIEW OF SYSTEMS As per history of present illness.   PAST MEDICAL HISTORY: Past Medical History:  Diagnosis Date   Diabetes mellitus without complication (HCC)    GERD (gastroesophageal reflux disease)    Obesity     PAST SURGICAL HISTORY: Past Surgical History:  Procedure Laterality Date   GROWTH PLATE SURGERY Bilateral 1980s   WISDOM TOOTH EXTRACTION      ALLERGIES: No Known Allergies  FAMILY HISTORY:  Family History  Problem Relation Age of Onset   Heart disease Mother    Hypertension Mother    Early death Neg Hx    Alcohol abuse Neg Hx    Birth defects Neg Hx    Cancer Neg Hx    COPD Neg Hx    Drug abuse Neg Hx    Hyperlipidemia Neg Hx    Kidney disease Neg  Hx    Stroke Neg Hx    Thyroid disease Neg Hx    Diabetes Maternal Grandfather     SOCIAL HISTORY: Social History   Socioeconomic History   Marital status: Married    Spouse name: Not on file   Number of children: Not on file   Years of education: Not on file   Highest education level: Not on file  Occupational History   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    Employer: [employer]   Occupation: Paramedic  Tobacco Use   Smoking status: Never   Smokeless tobacco: Never  Substance and Sexual Activity   Alcohol use: No   Drug use: No   Sexual activity: Not Currently  Other Topics Concern   Not on file  Social History Narrative   Not on file   Social Determinants of Health   Financial  Resource Strain: Not on file  Food Insecurity: Not on file  Transportation Needs: Not on file  Physical Activity: Not on file  Stress: Not on file  Social Connections: Unknown (06/16/2021)   [facility] from [facility]   Social Network    Social Network: Not on file    MEDICATIONS:  Current Outpatient Medications  Medication Sig Dispense Refill   Blood Glucose Monitoring Suppl (ONETOUCH VERIO) w/Device KIT Use to check blood sugar once a day 1 kit 0   glucose blood (ONETOUCH VERIO) test strip USE TO TEST BLOOD SUGAR ONCE DAILY AS DIRECTED 100 strip 2   irbesartan (AVAPRO) 150 MG tablet TAKE 1 TABLET(150 MG) BY MOUTH DAILY 90 tablet 0   levocetirizine (XYZAL) 5 MG tablet Take 1 tablet (5 mg total) by mouth every evening. 90 tablet 0   montelukast (SINGULAIR) 10 MG tablet Take 1 tablet (10 mg total) by mouth at bedtime. Keep Aug appt for future refills 90 tablet 0   OneTouch Delica Lancets 33G MISC Use to check blood sugar once a day 100 each 2   tamsulosin (FLOMAX) 0.4 MG CAPS capsule Take 1 capsule (0.4 mg total) by mouth daily after supper. 10 capsule 1   tirzepatide (MOUNJARO) 15 MG/0.5ML Pen Inject 15 mg into the skin once a week. 6 mL 4   Empagliflozin-metFORMIN HCl ER (SYNJARDY XR) 12.06-998 MG TB24 Take 2 tablets by mouth daily with breakfast. 180 tablet 3   rosuvastatin (CRESTOR) 20 MG tablet Take 1 tablet (20 mg total) by mouth daily. TAKE 1 TABLET(20 MG) BY MOUTH DAILY 90 tablet 3   No current facility-administered medications for this visit.    PHYSICAL EXAM: Vitals:   12/18/22 1316  BP: 136/80  Pulse: 87  Resp: 20  SpO2: 98%  Weight: 297 lb 6.4 oz (134.9 kg)  Height: 6' (1.829 m)   Body mass index is 40.33 kg/m.  Wt Readings from Last 3 Encounters:  12/18/22 297 lb 6.4 oz (134.9 kg)  09/25/22 (!) 304 lb (137.9 kg)  08/20/22 295 lb (133.8 kg)    General: Well developed, well nourished male in no apparent distress.  HEENT: AT/[place], no external  lesions.  Eyes: Conjunctiva clear and no icterus. Neck: Neck supple.  No thyromegaly.  Palpable left thyroid nodule measuring ~3 cm, mobile, nontender. Lungs: Respirations not labored Neurologic: Alert, oriented, normal speech Extremities / Skin: Dry. No sores or rashes noted.  Psychiatric: Does not appear depressed or anxious  Diabetic Foot Exam - Simple   No data filed    LABS Reviewed Lab Results  Component Value Date   HGBA1C 5.7 (  A) 12/18/2022   HGBA1C 6.7 (H) 09/25/2022   HGBA1C 6.1 06/19/2022   Lab Results  Component Value Date   FRUCTOSAMINE 357 (H) 02/05/2018   FRUCTOSAMINE 235 05/08/2015   Lab Results  Component Value Date   CHOL 103 01/30/2022   HDL 39.40 01/30/2022   LDLCALC 41 01/30/2022   LDLDIRECT 141.3 01/12/2014   TRIG 113.0 01/30/2022   CHOLHDL 3 01/30/2022   Lab Results  Component Value Date   MICRALBCREAT 0.6 06/19/2022   MICRALBCREAT 0.6 10/19/2021   Lab Results  Component Value Date   CREATININE 0.87 09/25/2022   Lab Results  Component Value Date   GFR 100.77 09/25/2022    ASSESSMENT / PLAN  1. Controlled type 2 diabetes mellitus without complication, without long-term current use of insulin (HCC)   2. Left thyroid nodule   3. Hyperlipidemia LDL goal <100   4. Type 2 diabetes mellitus with obesity (HCC)     Diabetes Mellitus type 2, complicated by no known complications. - Diabetic status / severity: Controlled.  Lab Results  Component Value Date   HGBA1C 5.7 (A) 12/18/2022    - Hemoglobin A1c goal : <7%  Would like to increase the dose of Mounjaro to have weight loss benefit.  - Medications: Below. Continue Synjardy XR 12.06/998 mg 2 tab daily in the morning.  Increase Mounjaro 10 mg weekly to 15 mg weekly.  He had tolerated 15 mg in the past.  - Home glucose testing: In the morning fasting daily. - Discussed/ Gave Hypoglycemia treatment plan.  # Consult : not required at this time.   # Annual urine for  microalbuminuria/ creatinine ratio, no microalbuminuria currently, continue ACE/ARB /irbesartan. Last  Lab Results  Component Value Date   MICRALBCREAT 0.6 06/19/2022    # Foot check nightly.  # Annual dilated diabetic eye exams.   - Diet: Make healthy diabetic food choices - Life style / activity / exercise: Discussed.  2. Blood pressure  -  BP Readings from Last 1 Encounters:  12/18/22 136/80    - Control is in target.  - No change in current plans.  3. Lipid status / Hyperlipidemia - Last  Lab Results  Component Value Date   LDLCALC 41 01/30/2022   - Continue rosuvastatin 20 mg daily.  # Left thyroid nodule -He is known to have left thyroid nodule since 2015, had last ultrasound thyroid in March 2017, measuring 2.9 cm status post FNA at that time with benign cytology. -Will check ultrasound thyroid to monitor thyroid nodule.  Diagnoses and all orders for this visit:  Controlled type 2 diabetes mellitus without complication, without long-term current use of insulin (HCC) -     POCT glycosylated hemoglobin (Hb A1C)  Left thyroid nodule -     US THYROID; Future  Hyperlipidemia LDL goal <100 -     rosuvastatin (CRESTOR) 20 MG tablet; Take 1 tablet (20 mg total) by mouth daily. TAKE 1 TABLET(20 MG) BY MOUTH DAILY  Type 2 diabetes mellitus with obesity (HCC) -     Empagliflozin-metFORMIN HCl ER (SYNJARDY XR) 12.06-998 MG TB24; Take 2 tablets by mouth daily with breakfast.  Other orders -     tirzepatide (MOUNJARO) 15 MG/0.5ML Pen; Inject 15 mg into the skin once a week.    DISPOSITION Follow up in clinic in  4 months suggested.   All questions answered and patient verbalized understanding of the plan.  [place] Laquetta Racey, MD [facility] Endocrinology [facility] [address] [place],  [address] [place], [place] [address] Phone # [phone]  At least part of this note was generated using voice recognition software. Inadvertent word errors may have occurred,  which were not recognized during the proofreading process.

## 2022-12-25 ENCOUNTER — Other Ambulatory Visit: Payer: BC Managed Care – PPO

## 2022-12-27 ENCOUNTER — Other Ambulatory Visit: Payer: Self-pay | Admitting: Internal Medicine

## 2022-12-27 DIAGNOSIS — I10 Essential (primary) hypertension: Secondary | ICD-10-CM

## 2022-12-27 DIAGNOSIS — E139 Other specified diabetes mellitus without complications: Secondary | ICD-10-CM

## 2022-12-27 MED ORDER — IRBESARTAN 150 MG PO TABS: 150.0000 mg | ORAL_TABLET | Freq: Every day | ORAL | 0 refills | Status: DC

## 2022-12-31 ENCOUNTER — Ambulatory Visit
Admission: RE | Admit: 2022-12-31 | Discharge: 2022-12-31 | Disposition: A | Discharge status: Home / Self Care | Payer: BC Managed Care – PPO | Source: Ambulatory Visit | Attending: Endocrinology | Admitting: Endocrinology

## 2022-12-31 DIAGNOSIS — E042 Nontoxic multinodular goiter: Secondary | ICD-10-CM | POA: Diagnosis not present

## 2022-12-31 DIAGNOSIS — E041 Nontoxic single thyroid nodule: Secondary | ICD-10-CM

## 2023-01-06 ENCOUNTER — Other Ambulatory Visit: Payer: Self-pay | Admitting: Endocrinology

## 2023-01-06 ENCOUNTER — Encounter: Payer: Self-pay | Admitting: Endocrinology

## 2023-01-06 DIAGNOSIS — E041 Nontoxic single thyroid nodule: Secondary | ICD-10-CM

## 2023-01-07 ENCOUNTER — Telehealth: Payer: Self-pay

## 2023-01-07 NOTE — Telephone Encounter (Signed)
 VM left requesting callback to give results of labs.

## 2023-01-07 NOTE — Telephone Encounter (Signed)
-----   Message from [place] Thapa sent at 01/06/2023  5:06 PM EST ----- Ultrasound thyroid reviewed, left thyroid nodule has increased in size to 5.7 cm, this nodule need fine-needle aspiration/needle biopsy.  I have referred to IR, please notify the patient.

## 2023-01-07 NOTE — Telephone Encounter (Signed)
I see that it is scheduled for December 11.

## 2023-01-08 ENCOUNTER — Telehealth: Payer: Self-pay

## 2023-01-08 NOTE — Telephone Encounter (Signed)
-----   Message from [place] Thapa sent at 01/06/2023  5:06 PM EST ----- Ultrasound thyroid reviewed, left thyroid nodule has increased in size to 5.7 cm, this nodule need fine-needle aspiration/needle biopsy.  I have referred to IR, please notify the patient.

## 2023-01-08 NOTE — Telephone Encounter (Signed)
 Patient given results as directed by MD. No further questions at this time.

## 2023-01-15 ENCOUNTER — Other Ambulatory Visit (HOSPITAL_COMMUNITY)
Admission: RE | Admit: 2023-01-15 | Discharge: 2023-01-15 | Disposition: A | Discharge status: Home / Self Care | Payer: BC Managed Care – PPO | Source: Ambulatory Visit | Attending: Endocrinology | Admitting: Endocrinology

## 2023-01-15 ENCOUNTER — Ambulatory Visit
Admission: RE | Admit: 2023-01-15 | Discharge: 2023-01-15 | Disposition: A | Discharge status: Home / Self Care | Payer: BC Managed Care – PPO | Source: Ambulatory Visit | Attending: Endocrinology | Admitting: Endocrinology

## 2023-01-15 DIAGNOSIS — E041 Nontoxic single thyroid nodule: Secondary | ICD-10-CM

## 2023-01-15 DIAGNOSIS — E042 Nontoxic multinodular goiter: Secondary | ICD-10-CM | POA: Diagnosis not present

## 2023-01-17 ENCOUNTER — Encounter: Payer: Self-pay | Admitting: Endocrinology

## 2023-01-17 LAB — CYTOLOGY - NON PAP

## 2023-01-17 NOTE — Telephone Encounter (Signed)
He has large left thyroid nodule, which is expected to cause deviation of the trachea to right side.  In regard to enlargement of thyroid nodule it has increased from 4 cm in 2017 to 5.7 cm at this time.  This is also not significant enlargement over the period of 7 years.  This left thyroid nodule has been biopsied twice in 2017 and now with benign cytology which is reassuring.  If he has significant or worsening neck related symptoms for example dysphagia , choking, thyroid surgery can be considered.  Otherwise we can continue to monitor thyroid nodules with serial ultrasound and clinically.  [place] Tishana Clinkenbeard, MD [facility] Endocrinology [facility] [address] [place], [address] [place], [place] [address] Phone # [phone]

## 2023-01-31 ENCOUNTER — Other Ambulatory Visit (HOSPITAL_COMMUNITY): Payer: BC Managed Care – PPO

## 2023-02-04 ENCOUNTER — Other Ambulatory Visit: Payer: BC Managed Care – PPO

## 2023-02-12 ENCOUNTER — Other Ambulatory Visit (HOSPITAL_COMMUNITY): Payer: Self-pay | Attending: Medical Genetics

## 2023-03-06 ENCOUNTER — Other Ambulatory Visit: Payer: Self-pay | Admitting: Internal Medicine

## 2023-03-06 DIAGNOSIS — I10 Essential (primary) hypertension: Secondary | ICD-10-CM

## 2023-04-02 ENCOUNTER — Ambulatory Visit: Payer: BC Managed Care – PPO | Admitting: Internal Medicine

## 2023-04-15 ENCOUNTER — Ambulatory Visit: Payer: BC Managed Care – PPO | Admitting: Endocrinology

## 2023-05-04 ENCOUNTER — Encounter: Payer: Self-pay | Admitting: Endocrinology

## 2023-05-04 DIAGNOSIS — E1169 Type 2 diabetes mellitus with other specified complication: Secondary | ICD-10-CM

## 2023-05-05 MED ORDER — SYNJARDY XR 12.5-1000 MG PO TB24: 2.0000 | ORAL_TABLET | Freq: Every day | ORAL | 0 refills | Status: DC

## 2023-05-20 ENCOUNTER — Ambulatory Visit (INDEPENDENT_AMBULATORY_CARE_PROVIDER_SITE_OTHER): Admitting: Endocrinology

## 2023-05-20 ENCOUNTER — Encounter: Payer: Self-pay | Admitting: Endocrinology

## 2023-05-20 VITALS — BP 124/80 | HR 90 | Ht 72.0 in | Wt 285.8 lb

## 2023-05-20 DIAGNOSIS — Z7985 Long-term (current) use of injectable non-insulin antidiabetic drugs: Secondary | ICD-10-CM | POA: Diagnosis not present

## 2023-05-20 DIAGNOSIS — E1169 Type 2 diabetes mellitus with other specified complication: Secondary | ICD-10-CM

## 2023-05-20 DIAGNOSIS — E041 Nontoxic single thyroid nodule: Secondary | ICD-10-CM | POA: Diagnosis not present

## 2023-05-20 DIAGNOSIS — Z7984 Long term (current) use of oral hypoglycemic drugs: Secondary | ICD-10-CM

## 2023-05-20 DIAGNOSIS — E669 Obesity, unspecified: Secondary | ICD-10-CM

## 2023-05-20 DIAGNOSIS — E119 Type 2 diabetes mellitus without complications: Secondary | ICD-10-CM

## 2023-05-20 LAB — POCT GLYCOSYLATED HEMOGLOBIN (HGB A1C): Hemoglobin A1C: 5.8 % — AB (ref 4.0–5.6)

## 2023-05-20 MED ORDER — TIRZEPATIDE 15 MG/0.5ML ~~LOC~~ SOAJ: 15.0000 mg | SUBCUTANEOUS | 4 refills | Status: AC

## 2023-05-20 MED ORDER — SYNJARDY XR 12.5-1000 MG PO TB24: 2.0000 | ORAL_TABLET | Freq: Every day | ORAL | 3 refills | Status: DC

## 2023-05-20 NOTE — Progress Notes (Signed)
 Outpatient Endocrinology Note [place] Tyler Alipio, MD  05/20/23  Patient's Name: Tyler Benton    DOB: 1970/04/12    MRN: [id number]                                                    REASON OF VISIT: Follow up of type 2 diabetes mellitus  PCP: Etta Grandchild, MD  HISTORY OF PRESENT ILLNESS:   Tyler Benton is a 53 y.o. old male with past medical history listed below, is here for follow up for type 2 diabetes mellitus and thyroid nodule.   Pertinent Diabetes History: _Diagnosed as Diabetes Mellitus type 2  in 2014.  Chronic Diabetes Complications : Retinopathy: no. Last ophthalmology exam was done on annually, following with ophthalmology regularly.  Nephropathy: no, on ACE/ARB / irbesartan Peripheral neuropathy: no Coronary artery disease: no Stroke: no  Relevant comorbidities and cardiovascular risk factors: Obesity: yes Body mass index is 38.76 kg/m.  Hypertension: Yes  Hyperlipidemia : Yes, on statin   Current / Home Diabetic regimen includes: Synjardy XR 12.06/998 mg 2 tab daily in the morning.  Mounjaro 15 mg weekly.   Prior diabetic medications: Victoza and Xigduo.  Glycemic data:   One Touch Verio Flex glucometer download from April 1 to May 20, 2023 reviewed.  He has been checking occasionally.  In the last 2 weeks he had checked 3 times blood sugar 138, 142, 105.  Hypoglycemia: Patient has no hypoglycemic episodes. Patient has hypoglycemia awareness.  Factors modifying glucose control: 1.  Diabetic diet assessment: 3 meals a day.   2.  Staying active or exercising: walking.  3.  Medication compliance: compliant all of the time.  Multiple thyroid nodules: Patient has multiple thyroid nodules, ultrasound in September 2015 showed bilateral thyroid nodules, dominant nodule in the lower pole of left lobe measuring 2.5 cm.  Patient had repeat ultrasound thyroid in March 2017 showed increased size of the left-sided thyroid nodule measuring 2.9 cm.  He underwent  FNA of left thyroid nodule on April 25, 2015, cytology benign, colloid type. -No family history of of thyroid disorder/thyroid cancer. -He has normal thyroid function test, euthyroid and not on thyroid medication. -Ultrasound thyroid in December 31, 2022 showed left thyroid nodule increase in size to 5.7 cm.  He underwent FNA of this nodule due to increase in size in January 15, 2023 with benign cytology consistent with benign follicular nodule.  He has a small right thyroid nodule measuring 1 cm. -He has rightward shift of the trachea due to large left thyroid nodule.   Interval history  Hemoglobin A1c today 5.8%.  Diabetes regimen reviewed as noted above.  He lost about 12 to 15 pounds of weight in last 4 months after increasing dose of Mounjaro.  He has a stable sensation while swallowing in the anterior neck area.  No choking.  No hypo or hyperthyroid symptoms.  He had ultrasound thyroid in October 2024 and had FNA of right thyroid nodule due to increase in size with benign cytology as noted above.  REVIEW OF SYSTEMS As per history of present illness.   PAST MEDICAL HISTORY: Past Medical History:  Diagnosis Date   Diabetes mellitus without complication (HCC)    GERD (gastroesophageal reflux disease)    Obesity     PAST SURGICAL HISTORY: Past Surgical History:  Procedure Laterality  Date   GROWTH PLATE SURGERY Bilateral 1980s   WISDOM TOOTH EXTRACTION      ALLERGIES: No Known Allergies  FAMILY HISTORY:  Family History  Problem Relation Age of Onset   Heart disease Mother    Hypertension Mother    Early death Neg Hx    Alcohol abuse Neg Hx    Birth defects Neg Hx    Cancer Neg Hx    COPD Neg Hx    Drug abuse Neg Hx    Hyperlipidemia Neg Hx    Kidney disease Neg Hx    Stroke Neg Hx    Thyroid disease Neg Hx    Diabetes Maternal Grandfather     SOCIAL HISTORY: Social History   Socioeconomic History   Marital status: Married    Spouse name: Not on file    Number of children: Not on file   Years of education: Not on file   Highest education level: Not on file  Occupational History   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    Employer: [employer]   Occupation: Paramedic  Tobacco Use   Smoking status: Never   Smokeless tobacco: Never  Substance and Sexual Activity   Alcohol use: No   Drug use: No   Sexual activity: Not Currently  Other Topics Concern   Not on file  Social History Narrative   Not on file   Social Drivers of Health   Financial Resource Strain: Not on file  Food Insecurity: Not on file  Transportation Needs: Not on file  Physical Activity: Not on file  Stress: Not on file  Social Connections: Unknown (06/16/2021)   Received from [facility]   Social Network    Social Network: Not on file    MEDICATIONS:  Current Outpatient Medications  Medication Sig Dispense Refill   Blood Glucose Monitoring Suppl (ONETOUCH VERIO) w/Device KIT Use to check blood sugar once a day 1 kit 0   glucose blood (ONETOUCH VERIO) test strip USE TO TEST BLOOD SUGAR ONCE DAILY AS DIRECTED 100 strip 2   irbesartan (AVAPRO) 150 MG tablet Take 1 tablet (150 mg total) by mouth daily. 90 tablet 0   OneTouch Delica Lancets 33G MISC Use to check blood sugar once a day 100 each 2   rosuvastatin (CRESTOR) 20 MG tablet Take 1 tablet (20 mg total) by mouth daily. TAKE 1 TABLET(20 MG) BY MOUTH DAILY 90 tablet 3   amLODipine (NORVASC) 10 MG tablet TAKE 1 TABLET(10 MG) BY MOUTH DAILY (Patient not taking: Reported on 05/20/2023) 30 tablet 0   Empagliflozin-metFORMIN HCl ER (SYNJARDY XR) 12.06-998 MG TB24 Take 2 tablets by mouth daily with breakfast. 90 tablet 3   levocetirizine (XYZAL) 5 MG tablet Take 1 tablet (5 mg total) by mouth every evening. (Patient not taking: Reported on 05/20/2023) 90 tablet 0   montelukast (SINGULAIR) 10 MG tablet Take 1 tablet (10 mg total) by mouth at bedtime. Keep Aug appt for future refills (Patient not taking: Reported on  05/20/2023) 90 tablet 0   tamsulosin (FLOMAX) 0.4 MG CAPS capsule Take 1 capsule (0.4 mg total) by mouth daily after supper. (Patient not taking: Reported on 05/20/2023) 10 capsule 1   tirzepatide (MOUNJARO) 15 MG/0.5ML Pen Inject 15 mg into the skin once a week. 6 mL 4   No current facility-administered medications for this visit.    PHYSICAL EXAM: Vitals:   05/20/23 1434  BP: 124/80  Pulse: 90  SpO2: 97%  Weight: 285 lb 12.8 oz (129.6  kg)  Height: 6' (1.829 m)    Body mass index is 38.76 kg/m.  Wt Readings from Last 3 Encounters:  05/20/23 285 lb 12.8 oz (129.6 kg)  12/18/22 297 lb 6.4 oz (134.9 kg)  09/25/22 (!) 304 lb (137.9 kg)    General: Well developed, well nourished male in no apparent distress.  HEENT: AT/, no external lesions.  Eyes: Conjunctiva clear and no icterus. Neck: Neck supple.  No thyromegaly.  Palpable left thyroid nodule measuring ~3 cm, mobile, nontender.  Rightward shift of trachea. Lungs: Respirations not labored Neurologic: Alert, oriented, normal speech Extremities / Skin: Dry.   Psychiatric: Does not appear depressed or anxious  Diabetic Foot Exam - Simple   No data filed    LABS Reviewed Lab Results  Component Value Date   HGBA1C 5.8 (A) 05/20/2023   HGBA1C 5.7 (A) 12/18/2022   HGBA1C 6.7 (H) 09/25/2022   Lab Results  Component Value Date   FRUCTOSAMINE 357 (H) 02/05/2018   FRUCTOSAMINE 235 05/08/2015   Lab Results  Component Value Date   CHOL 103 01/30/2022   HDL 39.40 01/30/2022   LDLCALC 41 01/30/2022   LDLDIRECT 141.3 01/12/2014   TRIG 113.0 01/30/2022   CHOLHDL 3 01/30/2022   Lab Results  Component Value Date   MICRALBCREAT 0.6 06/19/2022   MICRALBCREAT 0.6 10/19/2021   Lab Results  Component Value Date   CREATININE 0.87 09/25/2022   Lab Results  Component Value Date   GFR 100.77 09/25/2022    ASSESSMENT / PLAN  1. Type 2 diabetes mellitus with obesity (HCC)   2. Left thyroid nodule    Diabetes Mellitus  type 2, complicated by no known complications. - Diabetic status / severity: Controlled.  Lab Results  Component Value Date   HGBA1C 5.8 (A) 05/20/2023    - Hemoglobin A1c goal : <7%  - Medications: Below.  No change. Continue Synjardy XR 12.06/998 mg 2 tab daily in the morning.  Continue Mounjaro 15 mg weekly.    - Home glucose testing: In the morning fasting , at least few times a week.  - Discussed/ Gave Hypoglycemia treatment plan.  # Consult : not required at this time.   # Annual urine for microalbuminuria/ creatinine ratio, no microalbuminuria currently, continue ACE/ARB /irbesartan and SGLT2 inhibitor.  Check urine microalbumin creatinine ratio today along with BMP with EGFR. Last  Lab Results  Component Value Date   MICRALBCREAT 0.6 06/19/2022    # Foot check nightly.  # Annual dilated diabetic eye exams.   - Diet: Make healthy diabetic food choices - Life style / activity / exercise: Discussed.  2. Blood pressure  -  BP Readings from Last 1 Encounters:  05/20/23 124/80    - Control is in target.  - No change in current plans.  3. Lipid status / Hyperlipidemia - Last  Lab Results  Component Value Date   LDLCALC 41 01/30/2022   - Continue rosuvastatin 20 mg daily. - Will check lipid panel today.  He likes to check CBC with differential as well with the lab work today.  # Left thyroid nodule -He has a small right thyroid nodule measuring 1 cm. -He is known to have left thyroid nodule since 2015, had last ultrasound thyroid in March 2017, measuring 2.9 cm status post FNA at that time with benign cytology. -Repeat ultrasound thyroid in October 2024 showed increased size of left thyroid nodule measuring 5.7 cm.  Status post FNA in December 2024 with benign cytology. -He  has some neck discomfort.  Discussed that unless neck compressive symptom is significant, we can continue to monitor with serial ultrasound.  If the neck compressive symptom is significant  thyroid surgery preferably left lobectomy can be considered. -Will check ultrasound thyroid to monitor thyroid nodule, next ultrasound around the end of this year of 2025. - Will check thyroid function test today.  Diagnoses and all orders for this visit:  Type 2 diabetes mellitus with obesity (HCC) -     POCT glycosylated hemoglobin (Hb A1C) -     Basic Metabolic Panel Without GFR -     Lipid panel -     Microalbumin / creatinine urine ratio -     CBC with Differential/Platelet -     Empagliflozin-metFORMIN HCl ER (SYNJARDY XR) 12.06-998 MG TB24; Take 2 tablets by mouth daily with breakfast. -     tirzepatide (MOUNJARO) 15 MG/0.5ML Pen; Inject 15 mg into the skin once a week.  Left thyroid nodule -     T4, free -     TSH    DISPOSITION Follow up in clinic in  4 months suggested.   All questions answered and patient verbalized understanding of the plan.  [place] Lenward Able, MD [facility] Endocrinology [facility] [address] [place], [address] [place], [place] [address] Phone # [phone]  At least part of this note was generated using voice recognition software. Inadvertent word errors may have occurred, which were not recognized during the proofreading process.

## 2023-05-21 ENCOUNTER — Encounter: Payer: Self-pay | Admitting: Endocrinology

## 2023-05-21 LAB — BASIC METABOLIC PANEL WITHOUT GFR
BUN: 8 mg/dL (ref 7–25)
CO2: 28 mmol/L (ref 20–32)
Calcium: 10 mg/dL (ref 8.6–10.3)
Chloride: 103 mmol/L (ref 98–110)
Creat: 0.81 mg/dL (ref 0.70–1.30)
Glucose, Bld: 102 mg/dL — ABNORMAL HIGH (ref 65–99)
Potassium: 4 mmol/L (ref 3.5–5.3)
Sodium: 140 mmol/L (ref 135–146)

## 2023-05-21 LAB — CBC WITH DIFFERENTIAL/PLATELET
Absolute Lymphocytes: 1342 {cells}/uL (ref 850–3900)
Absolute Monocytes: 630 {cells}/uL (ref 200–950)
Basophils Absolute: 32 {cells}/uL (ref 0–200)
Basophils Relative: 0.5 %
Eosinophils Absolute: 50 {cells}/uL (ref 15–500)
Eosinophils Relative: 0.8 %
HCT: 47.8 % (ref 38.5–50.0)
Hemoglobin: 16.1 g/dL (ref 13.2–17.1)
MCH: 32.3 pg (ref 27.0–33.0)
MCHC: 33.7 g/dL (ref 32.0–36.0)
MCV: 95.8 fL (ref 80.0–100.0)
MPV: 10.2 fL (ref 7.5–12.5)
Monocytes Relative: 10 %
Neutro Abs: 4246 {cells}/uL (ref 1500–7800)
Neutrophils Relative %: 67.4 %
Platelets: 271 10*3/uL (ref 140–400)
RBC: 4.99 10*6/uL (ref 4.20–5.80)
RDW: 12.9 % (ref 11.0–15.0)
Total Lymphocyte: 21.3 %
WBC: 6.3 10*3/uL (ref 3.8–10.8)

## 2023-05-21 LAB — LIPID PANEL
Cholesterol: 105 mg/dL (ref <200)
HDL: 42 mg/dL (ref >=40)
LDL Cholesterol (Calc): 47 mg/dL
Non-HDL Cholesterol (Calc): 63 mg/dL (ref <130)
Total CHOL/HDL Ratio: 2.5 (calc) (ref <5.0)
Triglycerides: 84 mg/dL (ref <150)

## 2023-05-21 LAB — MICROALBUMIN / CREATININE URINE RATIO
Creatinine, Urine: 137 mg/dL (ref 20–320)
Microalb Creat Ratio: 5 mg/g{creat} (ref <30)
Microalb, Ur: 0.7 mg/dL

## 2023-05-21 LAB — TSH: TSH: 0.54 m[IU]/L (ref 0.40–4.50)

## 2023-05-21 LAB — T4, FREE: Free T4: 1.3 ng/dL (ref 0.8–1.8)

## 2023-05-26 ENCOUNTER — Other Ambulatory Visit: Payer: Self-pay | Admitting: Internal Medicine

## 2023-05-26 DIAGNOSIS — I10 Essential (primary) hypertension: Secondary | ICD-10-CM

## 2023-05-26 DIAGNOSIS — E139 Other specified diabetes mellitus without complications: Secondary | ICD-10-CM

## 2023-05-26 MED ORDER — IRBESARTAN 150 MG PO TABS: 150.0000 mg | ORAL_TABLET | Freq: Every day | ORAL | 0 refills | Status: DC

## 2023-06-04 ENCOUNTER — Ambulatory Visit: Payer: BC Managed Care – PPO | Admitting: Internal Medicine

## 2023-06-04 ENCOUNTER — Encounter: Payer: Self-pay | Admitting: Internal Medicine

## 2023-06-04 VITALS — BP 124/78 | HR 69 | Temp 98.0°F | Resp 16 | Ht 72.0 in | Wt 291.8 lb

## 2023-06-04 DIAGNOSIS — E139 Other specified diabetes mellitus without complications: Secondary | ICD-10-CM

## 2023-06-04 DIAGNOSIS — R61 Generalized hyperhidrosis: Secondary | ICD-10-CM

## 2023-06-04 DIAGNOSIS — I10 Essential (primary) hypertension: Secondary | ICD-10-CM

## 2023-06-04 DIAGNOSIS — Z125 Encounter for screening for malignant neoplasm of prostate: Secondary | ICD-10-CM

## 2023-06-04 DIAGNOSIS — Z0001 Encounter for general adult medical examination with abnormal findings: Secondary | ICD-10-CM | POA: Diagnosis not present

## 2023-06-04 DIAGNOSIS — E291 Testicular hypofunction: Secondary | ICD-10-CM

## 2023-06-04 DIAGNOSIS — Z1211 Encounter for screening for malignant neoplasm of colon: Secondary | ICD-10-CM | POA: Insufficient documentation

## 2023-06-04 LAB — HEPATIC FUNCTION PANEL
ALT: 29 U/L (ref 0–53)
AST: 26 U/L (ref 0–37)
Albumin: 4.4 g/dL (ref 3.5–5.2)
Alkaline Phosphatase: 61 U/L (ref 39–117)
Bilirubin, Direct: 0.3 mg/dL (ref 0.0–0.3)
Total Bilirubin: 1.2 mg/dL (ref 0.2–1.2)
Total Protein: 7.1 g/dL (ref 6.0–8.3)

## 2023-06-04 LAB — PSA: PSA: 0.4 ng/mL (ref 0.10–4.00)

## 2023-06-04 LAB — C-REACTIVE PROTEIN: CRP: <1 mg/dL (ref 0.5–20.0)

## 2023-06-04 MED ORDER — IRBESARTAN 150 MG PO TABS
150.0000 mg | ORAL_TABLET | Freq: Every day | ORAL | 1 refills | Status: DC
Start: 2023-06-04 — End: 2023-11-11

## 2023-06-04 NOTE — Patient Instructions (Signed)
 Health Maintenance, Male  Adopting a healthy lifestyle and getting preventive care are important in promoting health and wellness. Ask your health care provider about:  The right schedule for you to have regular tests and exams.  Things you can do on your own to prevent diseases and keep yourself healthy.  What should I know about diet, weight, and exercise?  Eat a healthy diet    Eat a diet that includes plenty of vegetables, fruits, low-fat dairy products, and lean protein.  Do not eat a lot of foods that are high in solid fats, added sugars, or sodium.  Maintain a healthy weight  Body mass index (BMI) is a measurement that can be used to identify possible weight problems. It estimates body fat based on height and weight. Your health care provider can help determine your BMI and help you achieve or maintain a healthy weight.  Get regular exercise  Get regular exercise. This is one of the most important things you can do for your health. Most adults should:  Exercise for at least 150 minutes each week. The exercise should increase your heart rate and make you sweat (moderate-intensity exercise).  Do strengthening exercises at least twice a week. This is in addition to the moderate-intensity exercise.  Spend less time sitting. Even light physical activity can be beneficial.  Watch cholesterol and blood lipids  Have your blood tested for lipids and cholesterol at 51 years of age, then have this test every 5 years.  You may need to have your cholesterol levels checked more often if:  Your lipid or cholesterol levels are high.  You are older than 51 years of age.  You are at high risk for heart disease.  What should I know about cancer screening?  Many types of cancers can be detected early and may often be prevented. Depending on your health history and family history, you may need to have cancer screening at various ages. This may include screening for:  Colorectal cancer.  Prostate cancer.  Skin cancer.  Lung  cancer.  What should I know about heart disease, diabetes, and high blood pressure?  Blood pressure and heart disease  High blood pressure causes heart disease and increases the risk of stroke. This is more likely to develop in people who have high blood pressure readings or are overweight.  Talk with your health care provider about your target blood pressure readings.  Have your blood pressure checked:  Every 3-5 years if you are 9-95 years of age.  Every year if you are 85 years old or older.  If you are between the ages of 29 and 29 and are a current or former smoker, ask your health care provider if you should have a one-time screening for abdominal aortic aneurysm (AAA).  Diabetes  Have regular diabetes screenings. This checks your fasting blood sugar level. Have the screening done:  Once every three years after age 23 if you are at a normal weight and have a low risk for diabetes.  More often and at a younger age if you are overweight or have a high risk for diabetes.  What should I know about preventing infection?  Hepatitis B  If you have a higher risk for hepatitis B, you should be screened for this virus. Talk with your health care provider to find out if you are at risk for hepatitis B infection.  Hepatitis C  Blood testing is recommended for:  Everyone born from [birth year] through 1963.  Anyone  with known risk factors for hepatitis C.  Sexually transmitted infections (STIs)  You should be screened each year for STIs, including gonorrhea and chlamydia, if:  You are sexually active and are younger than 51 years of age.  You are older than 51 years of age and your health care provider tells you that you are at risk for this type of infection.  Your sexual activity has changed since you were last screened, and you are at increased risk for chlamydia or gonorrhea. Ask your health care provider if you are at risk.  Ask your health care provider about whether you are at high risk for HIV. Your health care provider  may recommend a prescription medicine to help prevent HIV infection. If you choose to take medicine to prevent HIV, you should first get tested for HIV. You should then be tested every 3 months for as long as you are taking the medicine.  Follow these instructions at home:  Alcohol use  Do not drink alcohol if your health care provider tells you not to drink.  If you drink alcohol:  Limit how much you have to 0-2 drinks a day.  Know how much alcohol is in your drink. In the [place] one drink equals one 12 oz bottle of beer (355 mL), one 5 oz glass of wine (148 mL), or one 1 oz glass of hard liquor (44 mL).  Lifestyle  Do not use any products that contain nicotine or tobacco. These products include cigarettes, chewing tobacco, and vaping devices, such as e-cigarettes. If you need help quitting, ask your health care provider.  Do not use street drugs.  Do not share needles.  Ask your health care provider for help if you need support or information about quitting drugs.  General instructions  Schedule regular health, dental, and eye exams.  Stay current with your vaccines.  Tell your health care provider if:  You often feel depressed.  You have ever been abused or do not feel safe at home.  Summary  Adopting a healthy lifestyle and getting preventive care are important in promoting health and wellness.  Follow your health care provider's instructions about healthy diet, exercising, and getting tested or screened for diseases.  Follow your health care provider's instructions on monitoring your cholesterol and blood pressure.  This information is not intended to replace advice given to you by your health care provider. Make sure you discuss any questions you have with your health care provider.  Document Revised: 06/12/2020 Document Reviewed: 06/12/2020  Elsevier Patient Education  2024 [employer].

## 2023-06-04 NOTE — Progress Notes (Unsigned)
 Subjective:  Patient ID: Tyler Benton, male    DOB: Feb 24, 1970  Age: 53 y.o. MRN: [id number]  CC: Annual Exam   HPI Tyler Benton presents for a CPX and f/up -----  Discussed the use of AI scribe software for clinical note transcription with the patient, who gave verbal consent to proceed.  History of Present Illness   Tyler Benton "Mickey Alar" is a 53 year old male who presents for an annual physical exam.  He experiences intermittent back pain, particularly when standing for extended periods, which necessitates leaning on the counter during activities like washing dishes. A previous x-ray indicated osteoarthritis, but he is not currently taking any medication for pain management.  He mentions having undergone a Cologuard test for colon cancer screening in the past, although the exact timing is unclear. No family history of colon or prostate cancer.  He had an eye exam approximately six to seven months ago. He also recalls having a foot exam conducted over a year and a half ago.  No breathing difficulties, issues with urine flow, or signs of prostate cancer. He notes a pattern of urination approximately 45 minutes after drinking fluids. No pain or swelling in his thighs.  He mentions a goiter that has been monitored over the past seven years, with minimal growth observed.       Outpatient Medications Prior to Visit  Medication Sig Dispense Refill   Blood Glucose Monitoring Suppl (ONETOUCH VERIO) w/Device KIT Use to check blood sugar once a day 1 kit 0   Empagliflozin -metFORMIN  HCl ER (SYNJARDY  XR) 12.06-998 MG TB24 Take 2 tablets by mouth daily with breakfast. 90 tablet 3   glucose blood (ONETOUCH VERIO) test strip USE TO TEST BLOOD SUGAR ONCE DAILY AS DIRECTED 100 strip 2   OneTouch Delica Lancets 33G MISC Use to check blood sugar once a day 100 each 2   rosuvastatin  (CRESTOR ) 20 MG tablet Take 1 tablet (20 mg total) by mouth daily. TAKE 1 TABLET(20 MG) BY MOUTH DAILY 90 tablet 3    tirzepatide  (MOUNJARO ) 15 MG/0.5ML Pen Inject 15 mg into the skin once a week. 6 mL 4   irbesartan  (AVAPRO ) 150 MG tablet Take 1 tablet (150 mg total) by mouth daily. Please keep upcoming appointment with Dr. Rochelle Chu for further refills. 9 tablet 0   levocetirizine (XYZAL ) 5 MG tablet Take 1 tablet (5 mg total) by mouth every evening. (Patient not taking: Reported on 06/04/2023) 90 tablet 0   montelukast  (SINGULAIR ) 10 MG tablet Take 1 tablet (10 mg total) by mouth at bedtime. Keep Aug appt for future refills (Patient not taking: Reported on 06/04/2023) 90 tablet 0   amLODipine  (NORVASC ) 10 MG tablet TAKE 1 TABLET(10 MG) BY MOUTH DAILY (Patient not taking: Reported on 06/04/2023) 30 tablet 0   tamsulosin  (FLOMAX ) 0.4 MG CAPS capsule Take 1 capsule (0.4 mg total) by mouth daily after supper. (Patient not taking: Reported on 06/04/2023) 10 capsule 1   No facility-administered medications prior to visit.    ROS Review of Systems  Constitutional:  Positive for fatigue and unexpected weight change (wt gain). Negative for appetite change, chills, diaphoresis and fever.       +++ night sweats  HENT: Negative.    Eyes: Negative.   Respiratory: Negative.  Negative for cough, chest tightness, shortness of breath and wheezing.   Cardiovascular:  Negative for chest pain, palpitations and leg swelling.  Gastrointestinal:  Negative for abdominal pain, constipation, diarrhea, nausea and vomiting.  Endocrine:  Negative.   Genitourinary: Negative.  Negative for difficulty urinating and dysuria.  Musculoskeletal:  Positive for back pain. Negative for arthralgias and myalgias.  Skin: Negative.   Neurological: Negative.  Negative for dizziness, weakness, numbness and headaches.  Hematological:  Negative for adenopathy. Does not bruise/bleed easily.  Psychiatric/Behavioral: Negative.  Negative for agitation.     Objective:  BP 124/78 (BP Location: Left Arm, Patient Position: Sitting)   Pulse 69   Temp 98 F  (36.7 C) (Temporal)   Resp 16   Ht 6' (1.829 m)   Wt 291 lb 12.8 oz (132.4 kg)   SpO2 95%   BMI 39.58 kg/m   BP Readings from Last 3 Encounters:  06/04/23 124/78  05/20/23 124/80  12/18/22 136/80    Wt Readings from Last 3 Encounters:  06/04/23 291 lb 12.8 oz (132.4 kg)  05/20/23 285 lb 12.8 oz (129.6 kg)  12/18/22 297 lb 6.4 oz (134.9 kg)    Physical Exam Vitals reviewed.  Constitutional:      Appearance: He is obese.  HENT:     Nose: Nose normal.     Mouth/Throat:     Mouth: Mucous membranes are moist.  Eyes:     General: No scleral icterus.    Conjunctiva/sclera: Conjunctivae normal.  Cardiovascular:     Rate and Rhythm: Normal rate and regular rhythm.     Heart sounds: No murmur heard.    No friction rub. No gallop.     Comments: EKG--- NSR with SA, 74 bpm No LVH, Q waves, or ST/T wave changes  Pulmonary:     Effort: Pulmonary effort is normal.     Breath sounds: No stridor. No wheezing, rhonchi or rales.  Abdominal:     General: Abdomen is protuberant. Bowel sounds are normal. There is no distension or abdominal bruit. There are no signs of injury.     Palpations: Abdomen is soft. There is no hepatomegaly, splenomegaly or mass.     Tenderness: There is no abdominal tenderness. There is no guarding or rebound.     Hernia: There is no hernia in the left inguinal area or right inguinal area.  Genitourinary:    Pubic Area: No rash.      Penis: Normal and circumcised.      Testes: Normal.     Epididymis:     Right: Normal.     Left: Normal.     Prostate: Normal. Not enlarged, not tender and no nodules present.     Rectum: Normal. Guaiac result negative. No mass, tenderness, anal fissure, external hemorrhoid or internal hemorrhoid. Normal anal tone.  Musculoskeletal:        General: Normal range of motion.     Cervical back: Neck supple.     Right lower leg: No edema.     Left lower leg: No edema.  Lymphadenopathy:     Cervical: No cervical adenopathy.      Lower Body: No right inguinal adenopathy. No left inguinal adenopathy.  Skin:    General: Skin is warm and dry.  Neurological:     General: No focal deficit present.     Mental Status: He is alert. Mental status is at baseline.  Psychiatric:        Mood and Affect: Mood normal.        Behavior: Behavior normal.     Lab Results  Component Value Date   WBC 6.3 05/20/2023   HGB 16.1 05/20/2023   HCT 47.8 05/20/2023   PLT  271 05/20/2023   GLUCOSE 102 (H) 05/20/2023   CHOL 105 05/20/2023   TRIG 84 05/20/2023   HDL 42 05/20/2023   LDLDIRECT 141.3 01/12/2014   LDLCALC 47 05/20/2023   ALT 29 06/04/2023   AST 26 06/04/2023   NA 140 05/20/2023   K 4.0 05/20/2023   CL 103 05/20/2023   CREATININE 0.81 05/20/2023   BUN 8 05/20/2023   CO2 28 05/20/2023   TSH 0.54 05/20/2023   PSA 0.40 06/04/2023   HGBA1C 5.8 (A) 05/20/2023   MICROALBUR 0.7 05/20/2023    US  FNA BX THYROID  1ST LESION AFIRMA Result Date: 01/15/2023 INDICATION: Indeterminate thyroid  nodule, nodule 2 in the left thyroid  lobe (TI-RADS 4), meets criteria for biopsy. EXAM: ULTRASOUND GUIDED FINE NEEDLE ASPIRATION OF INDETERMINATE THYROID  NODULE COMPARISON:  Thyroid  ultrasound 12/31/2022. MEDICATIONS: None COMPLICATIONS: None immediate. TECHNIQUE: Informed written consent was obtained from the patient after a discussion of the risks, benefits and alternatives to treatment. Questions regarding the procedure were encouraged and answered. A timeout was performed prior to the initiation of the procedure. Pre-procedural ultrasound scanning demonstrated unchanged size and appearance of the indeterminate nodule within the left thyroid  lobe. The procedure was planned. The neck was prepped in the usual sterile fashion, and a sterile drape was applied covering the operative field. A timeout was performed prior to the initiation of the procedure. Local anesthesia was provided with 1% lidocaine. Under direct ultrasound guidance, 5 FNA biopsies  were performed of the nodule in the left mid thyroid  lobe with a 25 gauge needle. Multiple ultrasound images were saved for procedural documentation purposes. The samples were prepared and submitted to pathology. Limited post procedural scanning was negative for hematoma or additional complication. Dressings were placed. The patient tolerated the above procedures procedure well without immediate postprocedural complication. FINDINGS: Nodule reference number based on prior diagnostic ultrasound: 2 Maximum size: 5.7 cm. Location: Left; Mid ACR TI-RADS risk category: TR4 (4-6 points) Reason for biopsy: meets ACR TI-RADS criteria Ultrasound imaging confirms appropriate placement of the needles within the thyroid  nodule. IMPRESSION: Technically successful ultrasound guided fine needle aspiration of nodule 2 in the left mid thyroid  lobe. Electronically Signed   By: Audra Blend M.D.   On: 01/15/2023 17:23    Assessment & Plan:  Encounter for general adult medical examination with abnormal findings- Exam completed, labs reviewed, vaccines reviewed, cancer screenings addressed, pt ed material was given.  -     PSA; Future -     HIV Antibody (routine testing w rflx); Future  Diabetes 1.5, managed as type 1 (HCC) -     HM Diabetes Foot Exam -     Hepatic function panel; Future -     Irbesartan ; Take 1 tablet (150 mg total) by mouth daily.  Dispense: 90 tablet; Refill: 1  Essential hypertension- BP is well controlled. -     Hepatic function panel; Future -     Irbesartan ; Take 1 tablet (150 mg total) by mouth daily.  Dispense: 90 tablet; Refill: 1 -     EKG 12-Lead  Screening for colon cancer -     Cologuard  Chronic night sweats -     Testosterone Total,Free,Bio, Males; Future -     HIV Antibody (routine testing w rflx); Future -     C-reactive protein; Future -     Protein electrophoresis, serum; Future -     Hepatic function panel; Future -     QuantiFERON-TB Gold Plus; Future  Hypogonadism  in male -  Will start TRT. Isa Manuel; Inject 75 mg into the skin once a week.  Dispense: 6 mL; Refill: 0     Follow-up: Return in about 6 months (around 12/04/2023).  Sandra Crouch, MD

## 2023-06-06 LAB — PROTEIN ELECTROPHORESIS, SERUM
Albumin ELP: 4 g/dL (ref 3.8–4.8)
Alpha 1: 0.3 g/dL (ref 0.2–0.3)
Alpha 2: 0.8 g/dL (ref 0.5–0.9)
Beta 2: 0.4 g/dL (ref 0.2–0.5)
Beta Globulin: 0.4 g/dL (ref 0.4–0.6)
Gamma Globulin: 1.1 g/dL (ref 0.8–1.7)
Total Protein: 7.1 g/dL (ref 6.1–8.1)

## 2023-06-06 LAB — TESTOSTERONE TOTAL,FREE,BIO, MALES
Albumin: 4.3 g/dL (ref 3.6–5.1)
Sex Hormone Binding: 17 nmol/L (ref 10–50)
Testosterone: 173 ng/dL — ABNORMAL LOW (ref 250–827)

## 2023-06-06 LAB — QUANTIFERON-TB GOLD PLUS
Mitogen-NIL: 8.21 [IU]/mL
NIL: 0.02 [IU]/mL
QuantiFERON-TB Gold Plus: NEGATIVE
TB1-NIL: 0 [IU]/mL
TB2-NIL: <0 [IU]/mL

## 2023-06-06 LAB — HIV ANTIBODY (ROUTINE TESTING W REFLEX): HIV 1&2 Ab, 4th Generation: NONREACTIVE

## 2023-06-07 ENCOUNTER — Encounter: Payer: Self-pay | Admitting: Internal Medicine

## 2023-06-07 DIAGNOSIS — E291 Testicular hypofunction: Secondary | ICD-10-CM | POA: Insufficient documentation

## 2023-06-08 DIAGNOSIS — Z1211 Encounter for screening for malignant neoplasm of colon: Secondary | ICD-10-CM | POA: Diagnosis not present

## 2023-06-09 MED ORDER — XYOSTED 75 MG/0.5ML ~~LOC~~ SOAJ: 75.0000 mg | SUBCUTANEOUS | 0 refills | Status: DC

## 2023-06-17 LAB — COLOGUARD: COLOGUARD: NEGATIVE

## 2023-06-18 ENCOUNTER — Telehealth: Payer: Self-pay

## 2023-06-18 ENCOUNTER — Other Ambulatory Visit: Payer: Self-pay | Admitting: Internal Medicine

## 2023-06-18 DIAGNOSIS — E291 Testicular hypofunction: Secondary | ICD-10-CM

## 2023-06-18 MED ORDER — TESTOSTERONE 50 MG/5GM (1%) TD GEL
10.0000 g | Freq: Every day | TRANSDERMAL | 0 refills | Status: DC
Start: 2023-06-18 — End: 2023-07-25

## 2023-06-18 NOTE — Telephone Encounter (Signed)
 Pharmacy Patient Advocate Encounter   Received notification from CoverMyMeds that prior authorization for Testosterone  50 MG/5GM(1%) gel is required/requested.   Insurance verification completed.   The patient is insured through [facility] .   Per test claim: PA required; PA submitted to above mentioned insurance via CoverMyMeds Key/confirmation #/EOC [id number] Status is pending

## 2023-06-18 NOTE — Telephone Encounter (Addendum)
 Pharmacy Patient Advocate Encounter  Received notification from [facility] that Prior Authorization for Xyosted  has been DENIED.  See denial reason below. No denial letter attached in CMM. Will attach denial letter to Media tab once received.   PA #/Case ID/Reference #: [id number]    The Denial letter is indexed to the media tab.

## 2023-06-18 NOTE — Addendum Note (Signed)
 Addended by: Arcadio Knuckles on: 06/18/2023 07:16 AM   Modules accepted: Orders

## 2023-06-18 NOTE — Telephone Encounter (Signed)
 Dr. Rochelle Chu please advise. Can you send in one of the testosterones that's covered by his insurance/?

## 2023-06-19 ENCOUNTER — Other Ambulatory Visit (INDEPENDENT_AMBULATORY_CARE_PROVIDER_SITE_OTHER)

## 2023-06-19 DIAGNOSIS — E291 Testicular hypofunction: Secondary | ICD-10-CM | POA: Diagnosis not present

## 2023-06-19 LAB — LUTEINIZING HORMONE: LH: 3.23 m[IU]/mL (ref 1.50–9.30)

## 2023-06-20 ENCOUNTER — Ambulatory Visit: Payer: Self-pay | Admitting: Internal Medicine

## 2023-06-20 LAB — TESTOSTERONE TOTAL,FREE,BIO, MALES
Albumin: 4.2 g/dL (ref 3.6–5.1)
Sex Hormone Binding: 22 nmol/L (ref 10–50)
Testosterone: 202 ng/dL — ABNORMAL LOW (ref 250–827)

## 2023-06-20 NOTE — Telephone Encounter (Signed)
 Pharmacy Patient Advocate Encounter  Received notification from [facility] that Prior Authorization for Testosterone  50 MG/5GM(1%) gel  has been DENIED.  Full denial letter will be uploaded to the media tab. See denial reason below.   PA #/Case ID/Reference #: [id number]

## 2023-06-23 ENCOUNTER — Telehealth: Payer: Self-pay

## 2023-06-23 NOTE — Telephone Encounter (Signed)
 Looks like the patient was recently denied his Testosterone  medication. Dr. Rochelle Chu is requesting that we do the PA again for this medication. Please advise.

## 2023-06-24 NOTE — Telephone Encounter (Signed)
**Note De-identified  [place] Obfuscation** Please advise 

## 2023-07-24 ENCOUNTER — Other Ambulatory Visit

## 2023-07-24 ENCOUNTER — Other Ambulatory Visit: Payer: Self-pay

## 2023-07-24 DIAGNOSIS — E291 Testicular hypofunction: Secondary | ICD-10-CM

## 2023-07-25 ENCOUNTER — Ambulatory Visit: Payer: Self-pay | Admitting: Internal Medicine

## 2023-07-25 ENCOUNTER — Other Ambulatory Visit: Payer: Self-pay | Admitting: Internal Medicine

## 2023-07-25 DIAGNOSIS — E291 Testicular hypofunction: Secondary | ICD-10-CM

## 2023-07-25 LAB — TESTOSTERONE TOTAL,FREE,BIO, MALES
Albumin: 4 g/dL (ref 3.6–5.1)
Sex Hormone Binding: 19 nmol/L (ref 10–50)
Testosterone: 226 ng/dL — ABNORMAL LOW (ref 250–827)

## 2023-07-25 MED ORDER — TESTOSTERONE 50 MG/5GM (1%) TD GEL: 10.0000 g | Freq: Every day | TRANSDERMAL | 0 refills | Status: AC

## 2023-08-04 ENCOUNTER — Telehealth: Payer: Self-pay

## 2023-08-04 NOTE — Telephone Encounter (Signed)
 Pharmacy Patient Advocate Encounter   Received notification from CoverMyMeds that prior authorization for Testosterone  50 MG/5GM(1%) gel  is due for renewal.   Insurance verification completed.   The patient is insured through [facility].  PA has been submitted and documented in separate encounter, FROM 06/17/2024 with DENIAL THAT STATES PT SHOULD HAVE  2 LOW TESTOSTERONE  LEVELS COLLECTED IN THE EARLY MORNING BEFORE RESUBMISSION  PLEASE BE ADVISED

## 2023-08-06 ENCOUNTER — Telehealth: Payer: Self-pay | Admitting: Endocrinology

## 2023-08-06 DIAGNOSIS — E785 Hyperlipidemia, unspecified: Secondary | ICD-10-CM

## 2023-08-06 MED ORDER — ROSUVASTATIN CALCIUM 20 MG PO TABS: 20.0000 mg | ORAL_TABLET | Freq: Every day | ORAL | 3 refills | Status: AC

## 2023-08-06 NOTE — Telephone Encounter (Signed)
 MEDICATION:   rosuvastatin  (CRESTOR ) 20 MG tablet    PHARMACY:    [facility] - [place], [place] - [address] (Ph: [phone])    HAS THE PATIENT CONTACTED THEIR PHARMACY?  Yes  IS THIS A 90 DAY SUPPLY : Yes  IS PATIENT OUT OF MEDICATION: Yes  IF NOT; HOW MUCH IS LEFT:   LAST APPOINTMENT DATE: @4 /15/2025  NEXT APPOINTMENT DATE:@8 /13/2025  DO WE HAVE YOUR PERMISSION TO LEAVE A DETAILED MESSAGE?: Yes  OTHER COMMENTS:    **Let patient know to contact pharmacy at the end of the day to make sure medication is ready. **  ** Please notify patient to allow 48-72 hours to process**  **Encourage patient to contact the pharmacy for refills or they can request refills through [facility]**

## 2023-08-06 NOTE — Telephone Encounter (Addendum)
 Medication has been sent. Patient was notified about his refill.

## 2023-08-07 ENCOUNTER — Telehealth: Payer: Self-pay

## 2023-08-07 ENCOUNTER — Other Ambulatory Visit (HOSPITAL_COMMUNITY): Payer: Self-pay

## 2023-08-07 NOTE — Telephone Encounter (Signed)
**Note De-identified  [place] Obfuscation** Please advise 

## 2023-08-07 NOTE — Telephone Encounter (Signed)
 Pharmacy Patient Advocate Encounter   Received notification from [employer] Calls Messages that prior authorization for Testosterone  50mg /5gm (1%) gel  is required/requested.   Insurance verification completed.   The patient is insured through [facility] .   Per test claim: PA required; PA submitted to above mentioned insurance via CoverMyMeds Key/confirmation #/EOC [id number] Status is pending

## 2023-08-13 NOTE — Telephone Encounter (Signed)
 Pharmacy Patient Advocate Encounter  Received notification from [facility] that Prior Authorization for Testosterone  50 MG/5GM(1%) gel has been DENIED.  Full denial letter will be uploaded to the media tab. See denial reason below. THE REQUEST DOES NOT MEET THE DEFINITION OF MEDICAL NECESSITY   PA #/Case ID/Reference #: [id number]

## 2023-08-15 NOTE — Telephone Encounter (Signed)
 Dr. [name] they keep denying his medication because he hasn't had 2 LOW testosterone  readings IN THE MORNING. Is there any way that we can get this done for him so that he can get his testosterone ?

## 2023-08-19 ENCOUNTER — Other Ambulatory Visit (HOSPITAL_COMMUNITY): Payer: Self-pay

## 2023-08-19 ENCOUNTER — Telehealth: Payer: Self-pay | Admitting: Pharmacy Technician

## 2023-08-19 NOTE — Telephone Encounter (Signed)
 Pharmacy Patient Advocate Encounter   Received notification from CoverMyMeds that prior authorization for Mounjaro  5MG /0.5ML auto-injectors is due for renewal.   Insurance verification completed.   The patient is insured through [facility].  Action: Medication has been discontinued. Archived Key: [id number]  **Patient is now on 15MG /0.5ML**

## 2023-09-17 ENCOUNTER — Ambulatory Visit: Admitting: Endocrinology

## 2023-09-17 ENCOUNTER — Encounter: Payer: Self-pay | Admitting: Endocrinology

## 2023-09-17 ENCOUNTER — Ambulatory Visit: Payer: Self-pay | Admitting: Endocrinology

## 2023-09-17 VITALS — BP 138/72 | HR 70 | Resp 20 | Ht 72.0 in | Wt 288.0 lb

## 2023-09-17 DIAGNOSIS — Z7984 Long term (current) use of oral hypoglycemic drugs: Secondary | ICD-10-CM | POA: Diagnosis not present

## 2023-09-17 DIAGNOSIS — E041 Nontoxic single thyroid nodule: Secondary | ICD-10-CM | POA: Diagnosis not present

## 2023-09-17 DIAGNOSIS — E1169 Type 2 diabetes mellitus with other specified complication: Secondary | ICD-10-CM

## 2023-09-17 DIAGNOSIS — E669 Obesity, unspecified: Secondary | ICD-10-CM

## 2023-09-17 DIAGNOSIS — Z7985 Long-term (current) use of injectable non-insulin antidiabetic drugs: Secondary | ICD-10-CM

## 2023-09-17 LAB — POCT GLYCOSYLATED HEMOGLOBIN (HGB A1C): Hemoglobin A1C: 5.7 % — AB (ref 4.0–5.6)

## 2023-09-17 NOTE — Progress Notes (Signed)
 Outpatient Endocrinology Note [place] Meet [place], MD  09/17/23  Patient's Name: Tyler Benton    DOB: 1971-01-28    MRN: [id number]                                                    REASON OF VISIT: Follow up of type 2 diabetes mellitus  PCP: [name] Debby LITTIE, MD  HISTORY OF PRESENT ILLNESS:   Tyler Benton is a 53 y.o. old male with past medical history listed below, is here for follow up for type 2 diabetes mellitus and left thyroid  nodule.   Pertinent Diabetes History: _Diagnosed as Diabetes Mellitus type 2  in 2014.  Chronic Diabetes Complications : Retinopathy: no. Last ophthalmology exam was done on annually, following with ophthalmology regularly.  Nephropathy: no, on ACE/ARB / irbesartan  Peripheral neuropathy: no Coronary artery disease: no Stroke: no  Relevant comorbidities and cardiovascular risk factors: Obesity: yes Body mass index is 39.06 kg/m.  Hypertension: Yes  Hyperlipidemia : Yes, on statin   Current / Home Diabetic regimen includes: Synjardy  XR 12.06/998 mg 2 tab daily in the morning.  Mounjaro  15 mg weekly.   Prior diabetic medications: Victoza  and Xigduo .  Glycemic data:   One Touch Verio Flex glucometer download from July 30 to September 17, 2023 average blood sugar 125.  Some of the blood sugar 119, 117, 139.  Hypoglycemia: Patient has no hypoglycemic episodes. Patient has hypoglycemia awareness.  Factors modifying glucose control: 1.  Diabetic diet assessment: 3 meals a day.   2.  Staying active or exercising: walking.  3.  Medication compliance: compliant all of the time.  Multiple thyroid  nodules: Patient has multiple thyroid  nodules, ultrasound in September 2015 showed bilateral thyroid  nodules, dominant nodule in the lower pole of left lobe measuring 2.5 cm.  Patient had repeat ultrasound thyroid  in March 2017 showed increased size of the left-sided thyroid  nodule measuring 2.9 cm.  He underwent FNA of left thyroid  nodule on April 25, 2015,  cytology benign, colloid type. -No family history of of thyroid  disorder/thyroid  cancer. -He has normal thyroid  function test, euthyroid and not on thyroid  medication. -Ultrasound thyroid  in December 31, 2022 showed left thyroid  nodule increase in size to 5.7 cm.  He underwent FNA of this nodule due to increase in size in January 15, 2023 with benign cytology consistent with benign follicular nodule.  He has a small right thyroid  nodule measuring 1 cm. -He has rightward shift of the trachea due to large left thyroid  nodule.   Interval history  Hemoglobin A1c today 5.7%.  He has been tolerating Mounjaro  well.  He lost mild weight from the last visit.  No numbness and tingling of the feet.  No vision problem.  He occasionally has neck discomfort especially on swallowing of certain position.  No breathing trouble.  REVIEW OF SYSTEMS As per history of present illness.   PAST MEDICAL HISTORY: Past Medical History:  Diagnosis Date   Diabetes mellitus without complication (HCC)    GERD (gastroesophageal reflux disease)    Obesity     PAST SURGICAL HISTORY: Past Surgical History:  Procedure Laterality Date   GROWTH PLATE SURGERY Bilateral 1980s   WISDOM TOOTH EXTRACTION      ALLERGIES: No Known Allergies  FAMILY HISTORY:  Family History  Problem Relation Age of Onset   Heart disease Mother  Hypertension Mother    Early death Neg Hx    Alcohol abuse Neg Hx    Birth defects Neg Hx    Cancer Neg Hx    COPD Neg Hx    Drug abuse Neg Hx    Hyperlipidemia Neg Hx    Kidney disease Neg Hx    Stroke Neg Hx    Thyroid  disease Neg Hx    Diabetes Maternal Grandfather     SOCIAL HISTORY: Social History   Socioeconomic History   Marital status: Married    Spouse name: Not on file   Number of children: Not on file   Years of education: Not on file   Highest education level: Not on file  Occupational History   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    Employer: [employer]   Occupation:  Paramedic  Tobacco Use   Smoking status: Never   Smokeless tobacco: Never  Substance and Sexual Activity   Alcohol use: No   Drug use: No   Sexual activity: Not Currently  Other Topics Concern   Not on file  Social History Narrative   Not on file   Social Drivers of Health   Financial Resource Strain: Not on file  Food Insecurity: Not on file  Transportation Needs: Not on file  Physical Activity: Not on file  Stress: Not on file  Social Connections: Unknown (06/16/2021)   Received from [facility]   Social Network    Social Network: Not on file    MEDICATIONS:  Current Outpatient Medications  Medication Sig Dispense Refill   Blood Glucose Monitoring Suppl (ONETOUCH VERIO) w/Device KIT Use to check blood sugar once a day 1 kit 0   Empagliflozin -metFORMIN  HCl ER (SYNJARDY  XR) 12.06-998 MG TB24 Take 2 tablets by mouth daily with breakfast. 90 tablet 3   glucose blood (ONETOUCH VERIO) test strip USE TO TEST BLOOD SUGAR ONCE DAILY AS DIRECTED 100 strip 2   irbesartan  (AVAPRO ) 150 MG tablet Take 1 tablet (150 mg total) by mouth daily. 90 tablet 1   levocetirizine (XYZAL ) 5 MG tablet Take 1 tablet (5 mg total) by mouth every evening. 90 tablet 0   montelukast  (SINGULAIR ) 10 MG tablet Take 1 tablet (10 mg total) by mouth at bedtime. Keep Aug appt for future refills 90 tablet 0   OneTouch Delica Lancets 33G MISC Use to check blood sugar once a day 100 each 2   rosuvastatin  (CRESTOR ) 20 MG tablet Take 1 tablet (20 mg total) by mouth daily. TAKE 1 TABLET(20 MG) BY MOUTH DAILY 90 tablet 3   testosterone  (ANDROGEL ) 50 MG/5GM (1%) GEL Place 10 g onto the skin daily. 900 g 0   tirzepatide  (MOUNJARO ) 15 MG/0.5ML Pen Inject 15 mg into the skin once a week. 6 mL 4   No current facility-administered medications for this visit.    PHYSICAL EXAM: Vitals:   09/17/23 1451  BP: 138/72  Pulse: 70  Resp: 20  SpO2: 97%  Weight: 288 lb (130.6 kg)  Height: 6' (1.829 m)     Body mass index  is 39.06 kg/m.  Wt Readings from Last 3 Encounters:  09/17/23 288 lb (130.6 kg)  06/04/23 291 lb 12.8 oz (132.4 kg)  05/20/23 285 lb 12.8 oz (129.6 kg)    General: Well developed, well nourished male in no apparent distress.  HEENT: AT/[place], no external lesions.  Eyes: Conjunctiva clear and no icterus. Neck: Neck supple.  No thyromegaly.  Palpable left thyroid  nodule measuring ~3 cm, mobile, nontender.  Rightward shift of trachea. Lungs: Respirations not labored Neurologic: Alert, oriented, normal speech Extremities / Skin: Dry.   Psychiatric: Does not appear depressed or anxious  Diabetic Foot Exam - Simple   No data filed    LABS Reviewed Lab Results  Component Value Date   HGBA1C 5.7 (A) 09/17/2023   HGBA1C 5.8 (A) 05/20/2023   HGBA1C 5.7 (A) 12/18/2022   Lab Results  Component Value Date   FRUCTOSAMINE 357 (H) 02/05/2018   FRUCTOSAMINE 235 05/08/2015   Lab Results  Component Value Date   CHOL 105 05/20/2023   HDL 42 05/20/2023   LDLCALC 47 05/20/2023   LDLDIRECT 141.3 01/12/2014   TRIG 84 05/20/2023   CHOLHDL 2.5 05/20/2023   Lab Results  Component Value Date   MICRALBCREAT 5 05/20/2023   MICRALBCREAT <30 11/20/2016   Lab Results  Component Value Date   CREATININE 0.81 05/20/2023   Lab Results  Component Value Date   GFR 100.77 09/25/2022    ASSESSMENT / PLAN  1. Type 2 diabetes mellitus with obesity (HCC)   2. Left thyroid  nodule     Diabetes Mellitus type 2, complicated by no known complications. - Diabetic status / severity: Controlled.  Lab Results  Component Value Date   HGBA1C 5.7 (A) 09/17/2023    - Hemoglobin A1c goal : <6.5%  - Medications: Below.  No change. Continue Synjardy  XR 12.06/998 mg 2 tab daily in the morning.  Continue Mounjaro  15 mg weekly.    - Home glucose testing: In the morning fasting , at least few times a week.  - Discussed/ Gave Hypoglycemia treatment plan.  # Consult : not required at this time.   #  Annual urine for microalbuminuria/ creatinine ratio, no microalbuminuria currently, continue ACE/ARB /irbesartan  and SGLT2 inhibitor.  Last  Lab Results  Component Value Date   MICRALBCREAT 5 05/20/2023    # Foot check nightly.  # Annual dilated diabetic eye exams.   - Diet: Make healthy diabetic food choices - Life style / activity / exercise: Discussed.  2. Blood pressure  -  BP Readings from Last 1 Encounters:  09/17/23 138/72    - Control is in target.  - No change in current plans.  3. Lipid status / Hyperlipidemia - Last  Lab Results  Component Value Date   LDLCALC 47 05/20/2023   - Continue rosuvastatin  20 mg daily.  # Left thyroid  nodule -He has a small right thyroid  nodule measuring 1 cm. -He is known to have left thyroid  nodule since 2015, had last ultrasound thyroid  in March 2017, measuring 2.9 cm status post FNA at that time with benign cytology. -Repeat ultrasound thyroid  in October 2024 showed increased size of left thyroid  nodule measuring 5.7 cm.  Status post FNA in December 2024 with benign cytology. -He has some neck discomfort.  Discussed that unless neck compressive symptom is significant, we can continue to monitor with serial ultrasound.  If the neck compressive symptom is significant thyroid  surgery preferably left lobectomy can be considered. -Will check ultrasound thyroid  to monitor thyroid  nodule end of this year.  Order placed.  Diagnoses and all orders for this visit:  Type 2 diabetes mellitus with obesity (HCC) -     POCT glycosylated hemoglobin (Hb A1C)  Left thyroid  nodule -     US  THYROID ; Future   DISPOSITION Follow up in clinic in  6 months suggested.   All questions answered and patient verbalized understanding of the plan.  [place] Claris Pech, MD   Endocrinology [facility] [address], [address] [place], [place] [address] Phone # [phone]  At least part of this note was generated using voice recognition  software. Inadvertent word errors may have occurred, which were not recognized during the proofreading process.

## 2023-11-09 ENCOUNTER — Other Ambulatory Visit: Payer: Self-pay | Admitting: Internal Medicine

## 2023-11-09 DIAGNOSIS — I10 Essential (primary) hypertension: Secondary | ICD-10-CM

## 2023-11-09 DIAGNOSIS — E139 Other specified diabetes mellitus without complications: Secondary | ICD-10-CM

## 2023-11-26 ENCOUNTER — Other Ambulatory Visit: Payer: Self-pay | Admitting: Medical Genetics

## 2023-11-26 DIAGNOSIS — Z006 Encounter for examination for normal comparison and control in clinical research program: Secondary | ICD-10-CM

## 2023-12-03 ENCOUNTER — Ambulatory Visit: Admitting: Internal Medicine

## 2023-12-29 ENCOUNTER — Other Ambulatory Visit: Payer: Self-pay | Admitting: Endocrinology

## 2023-12-29 DIAGNOSIS — E669 Obesity, unspecified: Secondary | ICD-10-CM

## 2024-02-15 ENCOUNTER — Other Ambulatory Visit: Payer: Self-pay | Admitting: Endocrinology

## 2024-02-15 DIAGNOSIS — E669 Obesity, unspecified: Secondary | ICD-10-CM

## 2024-03-17 ENCOUNTER — Ambulatory Visit: Admitting: Endocrinology
